# Patient Record
Sex: Female | Born: 1935 | Race: White | Hispanic: No | Marital: Married | Smoking: Never smoker
Health system: Southern US, Community
[De-identification: ages and names within clinical notes are randomized; demographics above are authoritative.]

## PROBLEM LIST (undated history)

## (undated) DIAGNOSIS — N183 Chronic kidney disease, stage 3 unspecified: Secondary | ICD-10-CM

## (undated) DIAGNOSIS — G4700 Insomnia, unspecified: Secondary | ICD-10-CM

## (undated) DIAGNOSIS — E039 Hypothyroidism, unspecified: Secondary | ICD-10-CM

## (undated) DIAGNOSIS — M48062 Spinal stenosis, lumbar region with neurogenic claudication: Secondary | ICD-10-CM

## (undated) DIAGNOSIS — D649 Anemia, unspecified: Secondary | ICD-10-CM

## (undated) DIAGNOSIS — M47816 Spondylosis without myelopathy or radiculopathy, lumbar region: Secondary | ICD-10-CM

## (undated) DIAGNOSIS — I10 Essential (primary) hypertension: Secondary | ICD-10-CM

## (undated) DIAGNOSIS — M5416 Radiculopathy, lumbar region: Secondary | ICD-10-CM

## (undated) HISTORY — DX: Chronic kidney disease, stage 3 unspecified: N18.30

## (undated) HISTORY — PX: ABDOMINAL HYSTERECTOMY: SHX81

## (undated) HISTORY — DX: Spinal stenosis, lumbar region with neurogenic claudication: M48.062

## (undated) HISTORY — PX: APPENDECTOMY: SHX54

## (undated) HISTORY — DX: Anemia, unspecified: D64.9

## (undated) HISTORY — DX: Radiculopathy, lumbar region: M54.16

## (undated) HISTORY — DX: Insomnia, unspecified: G47.00

## (undated) HISTORY — DX: Chronic kidney disease, stage 3 (moderate): N18.3

## (undated) HISTORY — DX: Hypothyroidism, unspecified: E03.9

## (undated) HISTORY — DX: Essential (primary) hypertension: I10

## (undated) HISTORY — DX: Spondylosis without myelopathy or radiculopathy, lumbar region: M47.816

---

## 2000-05-10 HISTORY — PX: TOTAL KNEE ARTHROPLASTY: SHX125

## 2011-05-31 ENCOUNTER — Other Ambulatory Visit: Payer: Self-pay | Admitting: Sports Medicine

## 2011-05-31 LAB — BASIC METABOLIC PANEL
Anion Gap: 11 (ref 7–16)
BUN: 33 mg/dL — ABNORMAL HIGH (ref 7–18)
Calcium, Total: 9.4 mg/dL (ref 8.5–10.1)
Chloride: 101 mmol/L (ref 98–107)
Co2: 27 mmol/L (ref 21–32)
Creatinine: 2.06 mg/dL — ABNORMAL HIGH (ref 0.60–1.30)
EGFR (African American): 30 — ABNORMAL LOW
EGFR (Non-African Amer.): 25 — ABNORMAL LOW
Glucose: 94 mg/dL (ref 65–99)
Osmolality: 285 (ref 275–301)
Potassium: 5.2 mmol/L — ABNORMAL HIGH (ref 3.5–5.1)
Sodium: 139 mmol/L (ref 136–145)

## 2011-06-14 ENCOUNTER — Ambulatory Visit: Payer: Self-pay | Admitting: Sports Medicine

## 2013-12-18 ENCOUNTER — Ambulatory Visit: Payer: Self-pay | Admitting: Internal Medicine

## 2013-12-18 LAB — CBC CANCER CENTER
BASOS ABS: 0.1 x10 3/mm (ref 0.0–0.1)
BASOS PCT: 0.7 %
EOS PCT: 1.7 %
Eosinophil #: 0.1 x10 3/mm (ref 0.0–0.7)
HCT: 29.8 % — ABNORMAL LOW (ref 35.0–47.0)
HGB: 9.5 g/dL — AB (ref 12.0–16.0)
Lymphocyte #: 0.7 x10 3/mm — ABNORMAL LOW (ref 1.0–3.6)
Lymphocyte %: 9.4 %
MCH: 26.7 pg (ref 26.0–34.0)
MCHC: 31.8 g/dL — ABNORMAL LOW (ref 32.0–36.0)
MCV: 84 fL (ref 80–100)
Monocyte #: 0.6 x10 3/mm (ref 0.2–0.9)
Monocyte %: 7.7 %
Neutrophil #: 6.3 x10 3/mm (ref 1.4–6.5)
Neutrophil %: 80.5 %
PLATELETS: 589 x10 3/mm — AB (ref 150–440)
RBC: 3.54 10*6/uL — ABNORMAL LOW (ref 3.80–5.20)
RDW: 16.7 % — ABNORMAL HIGH (ref 11.5–14.5)
WBC: 7.9 x10 3/mm (ref 3.6–11.0)

## 2013-12-18 LAB — IRON AND TIBC
IRON SATURATION: 9 %
Iron Bind.Cap.(Total): 247 ug/dL — ABNORMAL LOW (ref 250–450)
Iron: 21 ug/dL — ABNORMAL LOW (ref 50–170)
UNBOUND IRON-BIND. CAP.: 226 ug/dL

## 2013-12-18 LAB — FERRITIN: Ferritin (ARMC): 105 ng/mL (ref 8–388)

## 2013-12-19 LAB — PROT IMMUNOELECTROPHORES(ARMC)

## 2013-12-19 LAB — KAPPA/LAMBDA FREE LIGHT CHAINS (ARMC)

## 2014-01-07 LAB — CBC CANCER CENTER
Basophil #: 0.1 x10 3/mm (ref 0.0–0.1)
Basophil %: 0.7 %
EOS PCT: 2 %
Eosinophil #: 0.1 x10 3/mm (ref 0.0–0.7)
HCT: 28.4 % — AB (ref 35.0–47.0)
HGB: 9 g/dL — ABNORMAL LOW (ref 12.0–16.0)
LYMPHS ABS: 0.5 x10 3/mm — AB (ref 1.0–3.6)
Lymphocyte %: 7.1 %
MCH: 26.6 pg (ref 26.0–34.0)
MCHC: 31.7 g/dL — ABNORMAL LOW (ref 32.0–36.0)
MCV: 84 fL (ref 80–100)
MONO ABS: 0.5 x10 3/mm (ref 0.2–0.9)
Monocyte %: 6.9 %
NEUTROS ABS: 6.1 x10 3/mm (ref 1.4–6.5)
NEUTROS PCT: 83.3 %
Platelet: 537 x10 3/mm — ABNORMAL HIGH (ref 150–440)
RBC: 3.39 10*6/uL — ABNORMAL LOW (ref 3.80–5.20)
RDW: 16.7 % — ABNORMAL HIGH (ref 11.5–14.5)
WBC: 7.4 x10 3/mm (ref 3.6–11.0)

## 2014-01-08 ENCOUNTER — Ambulatory Visit: Payer: Self-pay | Admitting: Internal Medicine

## 2014-01-22 LAB — FERRITIN: FERRITIN (ARMC): 107 ng/mL (ref 8–388)

## 2014-01-22 LAB — CANCER CENTER HEMOGLOBIN: HGB: 8.7 g/dL — AB (ref 12.0–16.0)

## 2014-02-07 ENCOUNTER — Ambulatory Visit: Payer: Self-pay | Admitting: Internal Medicine

## 2014-02-08 LAB — CANCER CENTER HEMOGLOBIN: HGB: 9.4 g/dL — AB (ref 12.0–16.0)

## 2014-03-08 LAB — CANCER CENTER HEMOGLOBIN: HGB: 9.5 g/dL — ABNORMAL LOW (ref 12.0–16.0)

## 2014-03-10 ENCOUNTER — Ambulatory Visit: Payer: Self-pay | Admitting: Internal Medicine

## 2014-04-08 LAB — FERRITIN: Ferritin (ARMC): 129 ng/mL (ref 8–388)

## 2014-04-08 LAB — CANCER CENTER HEMOGLOBIN: HGB: 9.5 g/dL — ABNORMAL LOW (ref 12.0–16.0)

## 2014-04-08 LAB — CANCER CTR PLATELET CT: PLATELETS: 570 x10 3/mm — AB (ref 150–440)

## 2014-04-09 ENCOUNTER — Ambulatory Visit: Payer: Self-pay | Admitting: Internal Medicine

## 2014-05-06 LAB — IRON AND TIBC
IRON BIND. CAP.(TOTAL): 227 ug/dL — AB (ref 250–450)
IRON SATURATION: 15 %
IRON: 35 ug/dL — AB (ref 50–170)
UNBOUND IRON-BIND. CAP.: 192 ug/dL

## 2014-05-06 LAB — CANCER CENTER HEMOGLOBIN: HGB: 9.4 g/dL — AB (ref 12.0–16.0)

## 2014-05-10 ENCOUNTER — Ambulatory Visit: Payer: Self-pay | Admitting: Internal Medicine

## 2014-06-03 LAB — CBC CANCER CENTER
BASOS PCT: 0.8 %
Basophil #: 0.1 x10 3/mm (ref 0.0–0.1)
EOS ABS: 0.4 x10 3/mm (ref 0.0–0.7)
Eosinophil %: 4.4 %
HCT: 28.2 % — ABNORMAL LOW (ref 35.0–47.0)
HGB: 8.9 g/dL — ABNORMAL LOW (ref 12.0–16.0)
LYMPHS PCT: 11.1 %
Lymphocyte #: 0.9 x10 3/mm — ABNORMAL LOW (ref 1.0–3.6)
MCH: 26.6 pg (ref 26.0–34.0)
MCHC: 31.6 g/dL — ABNORMAL LOW (ref 32.0–36.0)
MCV: 84 fL (ref 80–100)
MONOS PCT: 9.4 %
Monocyte #: 0.7 x10 3/mm (ref 0.2–0.9)
Neutrophil #: 5.9 x10 3/mm (ref 1.4–6.5)
Neutrophil %: 74.3 %
PLATELETS: 548 x10 3/mm — AB (ref 150–440)
RBC: 3.36 10*6/uL — AB (ref 3.80–5.20)
RDW: 15.5 % — ABNORMAL HIGH (ref 11.5–14.5)
WBC: 7.9 x10 3/mm (ref 3.6–11.0)

## 2014-06-03 LAB — IRON AND TIBC
IRON: 28 ug/dL — AB (ref 50–170)
Iron Bind.Cap.(Total): 211 ug/dL — ABNORMAL LOW (ref 250–450)
Iron Saturation: 13 %
Unbound Iron-Bind.Cap.: 183 ug/dL

## 2014-06-03 LAB — FERRITIN: Ferritin (ARMC): 121 ng/mL (ref 8–388)

## 2014-06-10 ENCOUNTER — Ambulatory Visit: Payer: Self-pay | Admitting: Internal Medicine

## 2014-07-09 ENCOUNTER — Ambulatory Visit
Admit: 2014-07-09 | Disposition: A | Discharge status: Home / Self Care | Payer: Self-pay | Attending: Internal Medicine | Admitting: Internal Medicine

## 2014-08-26 ENCOUNTER — Ambulatory Visit
Admit: 2014-08-26 | Disposition: A | Discharge status: Home / Self Care | Payer: Self-pay | Attending: Internal Medicine | Admitting: Internal Medicine

## 2014-08-26 LAB — CANCER CENTER HEMOGLOBIN: HGB: 10.2 g/dL — AB (ref 12.0–16.0)

## 2014-10-14 ENCOUNTER — Ambulatory Visit: Payer: Self-pay

## 2014-10-14 ENCOUNTER — Ambulatory Visit: Payer: Self-pay | Admitting: Internal Medicine

## 2014-10-14 ENCOUNTER — Other Ambulatory Visit: Payer: Self-pay

## 2014-10-16 ENCOUNTER — Ambulatory Visit: Payer: Self-pay | Admitting: Family Medicine

## 2014-10-16 ENCOUNTER — Inpatient Hospital Stay: Payer: Medicare Other

## 2014-10-16 ENCOUNTER — Ambulatory Visit: Payer: Self-pay

## 2014-11-20 ENCOUNTER — Inpatient Hospital Stay: Payer: Medicare Other | Attending: Family Medicine | Admitting: Family Medicine

## 2014-11-20 ENCOUNTER — Inpatient Hospital Stay: Payer: Medicare Other

## 2014-11-20 ENCOUNTER — Inpatient Hospital Stay (HOSPITAL_BASED_OUTPATIENT_CLINIC_OR_DEPARTMENT_OTHER): Payer: Medicare Other

## 2014-11-20 DIAGNOSIS — N183 Chronic kidney disease, stage 3 (moderate): Secondary | ICD-10-CM | POA: Diagnosis not present

## 2014-11-20 DIAGNOSIS — D509 Iron deficiency anemia, unspecified: Secondary | ICD-10-CM

## 2014-11-20 DIAGNOSIS — Z79899 Other long term (current) drug therapy: Secondary | ICD-10-CM | POA: Diagnosis not present

## 2014-11-20 DIAGNOSIS — D631 Anemia in chronic kidney disease: Secondary | ICD-10-CM | POA: Diagnosis present

## 2014-11-20 LAB — FERRITIN: FERRITIN: 127 ng/mL (ref 11–307)

## 2014-11-20 LAB — HEMOGLOBIN: Hemoglobin: 10.3 g/dL — ABNORMAL LOW (ref 12.0–16.0)

## 2014-11-20 NOTE — Progress Notes (Signed)
[place]  [phone]  [phone]     Lynn Rosario DOB: 05/08/1929  MR#: [id number]  KVQ#:[id number]  Patient Care Team: Sherrin Daisy, MD as PCP - General (Family Medicine)  CHIEF COMPLAINT:  Chief Complaint  Patient presents with  . Anemia  . Follow-up   Re: Multifactorial anemia, iron deficiency as well as anemia and chronic kidney disease  INTERVAL HISTORY:  Patient is here for further follow-up and treatment consideration regarding anemia. Patient reports feeling fairly well. She does have some chronic back pain related to arthritis, as well as knee pain. She is having some fatigue and weakness and does require an ambulatory aid. She denies any cough, shortness of breath, chest pain, fevers, chills. She was recently seen by Dr. Holley Raring at [facility]. Patient has a stage III chronic kidney disease. I have previously discussed use of Procrit injections as well as iron infusions.  REVIEW OF SYSTEMS:   Review of Systems  Constitutional: Positive for malaise/fatigue. Negative for fever, chills, weight loss and diaphoresis.  HENT: Negative for congestion, ear discharge, ear pain, hearing loss, nosebleeds, sore throat and tinnitus.   Eyes: Negative for blurred vision, double vision, photophobia, pain, discharge and redness.  Respiratory: Negative for cough, hemoptysis, sputum production, shortness of breath, wheezing and stridor.   Cardiovascular: Negative for chest pain, palpitations, orthopnea, claudication, leg swelling and PND.  Gastrointestinal: Negative for heartburn, nausea, vomiting, abdominal pain, diarrhea, constipation, blood in stool and melena.  Genitourinary: Negative.   Musculoskeletal: Positive for back pain.  Skin: Negative.   Neurological: Positive for weakness. Negative for dizziness, tingling, focal weakness, seizures and headaches.  Endo/Heme/Allergies: Does not bruise/bleed easily.    Psychiatric/Behavioral: Negative for depression. The patient is not nervous/anxious and does not have insomnia.     As per HPI. Otherwise, a complete review of systems is negatve.  PAST MEDICAL HISTORY: No past medical history on file.  PAST SURGICAL HISTORY: No past surgical history on file.  FAMILY HISTORY No family history on file.  GYNECOLOGIC HISTORY:  No LMP recorded.     ADVANCED DIRECTIVES:    HEALTH MAINTENANCE: History  Substance Use Topics  . Smoking status: Not on file  . Smokeless tobacco: Not on file  . Alcohol Use: Not on file     Colonoscopy:  PAP:  Bone density:  Lipid panel:  Allergies  Allergen Reactions  . Celecoxib Other (See Comments)    Decreased kidney function   . Clarithromycin Other (See Comments)    Loss of taste     Current Outpatient Prescriptions  Medication Sig Dispense Refill  . ALPRAZolam (XANAX) 0.25 MG tablet TAKE (1) TABLET BY MOUTH TWICE DAILY AS NEEDED FOR SLEEP/ANXIETY    . HYDROcodone-acetaminophen (NORCO/VICODIN) 5-325 MG per tablet 1/2-1 po tid prn Earliest Fill Date: 11/07/14    . levothyroxine (SYNTHROID, LEVOTHROID) 75 MCG tablet Take by mouth.    Marland Kitchen aspirin EC 81 MG tablet Take by mouth.    . Cetirizine HCl 10 MG CAPS Take by mouth.    . Cyanocobalamin 2500 MCG SUBL Place under the tongue.    . diphenhydramine-acetaminophen (TYLENOL PM) 25-500 MG TABS Take by mouth.    . ferrous sulfate 325 (65 FE) MG EC tablet Take by mouth.    Marland Kitchen lisinopril (PRINIVIL,ZESTRIL) 5 MG tablet Take by mouth.    . Multiple Vitamin (MULTI-VITAMINS) TABS Take by mouth.    . sodium bicarbonate 650 MG tablet Take by mouth.  No current facility-administered medications for this visit.    OBJECTIVE: There were no vitals taken for this visit.   There is no height or weight on file to calculate BMI.    ECOG FS:1 - Symptomatic but completely ambulatory  General: Well-developed, well-nourished, no acute distress. Eyes: Pink conjunctiva,  anicteric sclera. HEENT: Normocephalic, moist mucous membranes, clear oropharnyx. Lungs: Clear to auscultation bilaterally. Heart: Regular rate and rhythm. No rubs, murmurs, or gallops. Abdomen: Soft, nontender, nondistended. No organomegaly noted, normoactive bowel sounds. Musculoskeletal: No edema, cyanosis, or clubbing. Neuro: Alert, answering all questions appropriately. Cranial nerves grossly intact. Skin: No rashes or petechiae noted. Psych: Normal affect.    LAB RESULTS:  Appointment on 11/20/2014  Component Date Value Ref Range Status  . Hemoglobin 11/20/2014 10.3* 12.0 - 16.0 g/dL Final  . Ferritin 11/20/2014 127  11 - 307 ng/mL Final    STUDIES: No results found.  ASSESSMENT:  Anemia, multifactorial related to chronic kidney disease and iron deficiency  PLAN:   1. Anemia. Patient's ferritin level today is 127, hemoglobin 10.3. She was recently evaluated by nephrology due to a stage III chronic kidney disease. At time of consultation with Dr. Holley Raring in June 2016 hemoglobin was 9.7, ferritin 168, iron saturation 11%, and serum iron 23. As previously discussed the use of IV iron supplementation as well as Procrit injections with patient. At this time she does not wish to pursue either. She states she would like to wait until she is more symptomatic. Daughter is with patient and reports that if condition declines this and they will consider Procrit and IV iron. We will schedule patient for labs and [facility] in approximately 6 weeks. We'll have patient return in approximately 12 weeks for continued follow-up.  Patient expressed understanding and was in agreement with this plan. She also understands that She can call clinic at any time with any questions, concerns, or complaints.   Dr. Oliva Bustard was available for consultation and review of plan of care for this patient.    Evlyn Kanner, NP   11/20/2014 4:18 PM

## 2014-12-27 ENCOUNTER — Inpatient Hospital Stay: Payer: Medicare Other | Attending: Family Medicine

## 2014-12-27 DIAGNOSIS — N183 Chronic kidney disease, stage 3 (moderate): Secondary | ICD-10-CM | POA: Insufficient documentation

## 2014-12-27 DIAGNOSIS — D631 Anemia in chronic kidney disease: Secondary | ICD-10-CM | POA: Diagnosis not present

## 2014-12-27 DIAGNOSIS — Z79899 Other long term (current) drug therapy: Secondary | ICD-10-CM | POA: Diagnosis not present

## 2014-12-27 DIAGNOSIS — D509 Iron deficiency anemia, unspecified: Secondary | ICD-10-CM | POA: Insufficient documentation

## 2014-12-27 LAB — CBC WITH DIFFERENTIAL/PLATELET
BASOS PCT: 1 %
Basophils Absolute: 0.1 10*3/uL (ref 0–0.1)
Eosinophils Absolute: 0.2 10*3/uL (ref 0–0.7)
Eosinophils Relative: 3 %
HEMATOCRIT: 30.5 % — AB (ref 35.0–47.0)
HEMOGLOBIN: 9.9 g/dL — AB (ref 12.0–16.0)
Lymphocytes Relative: 23 %
Lymphs Abs: 1.7 10*3/uL (ref 1.0–3.6)
MCH: 28.5 pg (ref 26.0–34.0)
MCHC: 32.6 g/dL (ref 32.0–36.0)
MCV: 87.2 fL (ref 80.0–100.0)
MONOS PCT: 8 %
Monocytes Absolute: 0.6 10*3/uL (ref 0.2–0.9)
NEUTROS ABS: 4.6 10*3/uL (ref 1.4–6.5)
NEUTROS PCT: 65 %
Platelets: 567 10*3/uL — ABNORMAL HIGH (ref 150–440)
RBC: 3.5 MIL/uL — AB (ref 3.80–5.20)
RDW: 14.7 % — ABNORMAL HIGH (ref 11.5–14.5)
WBC: 7.1 10*3/uL (ref 3.6–11.0)

## 2014-12-27 LAB — FERRITIN: Ferritin: 143 ng/mL (ref 11–307)

## 2014-12-27 LAB — IRON AND TIBC
Iron: 41 ug/dL (ref 28–170)
SATURATION RATIOS: 20 % (ref 10.4–31.8)
TIBC: 204 ug/dL — AB (ref 250–450)
UIBC: 163 ug/dL

## 2015-02-07 ENCOUNTER — Inpatient Hospital Stay: Payer: Medicare Other

## 2015-02-07 ENCOUNTER — Inpatient Hospital Stay: Payer: Medicare Other | Admitting: Family Medicine

## 2015-02-14 ENCOUNTER — Other Ambulatory Visit: Payer: Medicare Other

## 2015-02-14 ENCOUNTER — Ambulatory Visit: Payer: Medicare Other | Admitting: Family Medicine

## 2015-02-24 ENCOUNTER — Encounter: Payer: Self-pay | Admitting: *Deleted

## 2015-02-24 ENCOUNTER — Inpatient Hospital Stay (HOSPITAL_BASED_OUTPATIENT_CLINIC_OR_DEPARTMENT_OTHER): Payer: Medicare Other | Admitting: Internal Medicine

## 2015-02-24 ENCOUNTER — Ambulatory Visit: Payer: Medicare Other | Admitting: Family Medicine

## 2015-02-24 ENCOUNTER — Inpatient Hospital Stay: Payer: Medicare Other | Attending: Family Medicine

## 2015-02-24 ENCOUNTER — Other Ambulatory Visit: Payer: Self-pay | Admitting: *Deleted

## 2015-02-24 ENCOUNTER — Other Ambulatory Visit: Payer: Medicare Other

## 2015-02-24 VITALS — BP 104/52 | HR 76 | Temp 97.2°F | Resp 18 | Ht 60.0 in | Wt 105.6 lb

## 2015-02-24 DIAGNOSIS — N189 Chronic kidney disease, unspecified: Secondary | ICD-10-CM

## 2015-02-24 DIAGNOSIS — I129 Hypertensive chronic kidney disease with stage 1 through stage 4 chronic kidney disease, or unspecified chronic kidney disease: Secondary | ICD-10-CM | POA: Diagnosis not present

## 2015-02-24 DIAGNOSIS — D631 Anemia in chronic kidney disease: Secondary | ICD-10-CM

## 2015-02-24 DIAGNOSIS — N183 Chronic kidney disease, stage 3 (moderate): Secondary | ICD-10-CM | POA: Diagnosis not present

## 2015-02-24 DIAGNOSIS — D509 Iron deficiency anemia, unspecified: Secondary | ICD-10-CM

## 2015-02-24 DIAGNOSIS — M47816 Spondylosis without myelopathy or radiculopathy, lumbar region: Secondary | ICD-10-CM | POA: Insufficient documentation

## 2015-02-24 DIAGNOSIS — Z79899 Other long term (current) drug therapy: Secondary | ICD-10-CM | POA: Insufficient documentation

## 2015-02-24 DIAGNOSIS — E039 Hypothyroidism, unspecified: Secondary | ICD-10-CM | POA: Insufficient documentation

## 2015-02-24 LAB — CBC WITH DIFFERENTIAL/PLATELET
BASOS PCT: 1 %
Basophils Absolute: 0 10*3/uL (ref 0–0.1)
EOS ABS: 0.1 10*3/uL (ref 0–0.7)
Eosinophils Relative: 2 %
HEMATOCRIT: 28.4 % — AB (ref 35.0–47.0)
HEMOGLOBIN: 8.9 g/dL — AB (ref 12.0–16.0)
Lymphocytes Relative: 11 %
Lymphs Abs: 0.6 10*3/uL — ABNORMAL LOW (ref 1.0–3.6)
MCH: 27.4 pg (ref 26.0–34.0)
MCHC: 31.6 g/dL — AB (ref 32.0–36.0)
MCV: 87 fL (ref 80.0–100.0)
MONOS PCT: 11 %
Monocytes Absolute: 0.6 10*3/uL (ref 0.2–0.9)
NEUTROS ABS: 4.4 10*3/uL (ref 1.4–6.5)
NEUTROS PCT: 75 %
Platelets: 517 10*3/uL — ABNORMAL HIGH (ref 150–440)
RBC: 3.26 MIL/uL — AB (ref 3.80–5.20)
RDW: 15.1 % — ABNORMAL HIGH (ref 11.5–14.5)
WBC: 5.9 10*3/uL (ref 3.6–11.0)

## 2015-02-24 LAB — IRON AND TIBC
IRON: 15 ug/dL — AB (ref 28–170)
SATURATION RATIOS: 8 % — AB (ref 10.4–31.8)
TIBC: 192 ug/dL — AB (ref 250–450)
UIBC: 177 ug/dL

## 2015-02-24 LAB — BASIC METABOLIC PANEL
ANION GAP: 8 (ref 5–15)
BUN: 26 mg/dL — ABNORMAL HIGH (ref 6–20)
CHLORIDE: 97 mmol/L — AB (ref 101–111)
CO2: 27 mmol/L (ref 22–32)
CREATININE: 1.46 mg/dL — AB (ref 0.44–1.00)
Calcium: 8.8 mg/dL — ABNORMAL LOW (ref 8.9–10.3)
GFR calc non Af Amer: 32 mL/min — ABNORMAL LOW (ref >60)
GFR, EST AFRICAN AMERICAN: 37 mL/min — AB (ref >60)
Glucose, Bld: 127 mg/dL — ABNORMAL HIGH (ref 65–99)
POTASSIUM: 4.4 mmol/L (ref 3.5–5.1)
SODIUM: 132 mmol/L — AB (ref 135–145)

## 2015-02-24 LAB — FERRITIN: Ferritin: 146 ng/mL (ref 11–307)

## 2015-02-24 NOTE — Progress Notes (Signed)
Back pain from arthritis-gets injections by Dr. Sharlet Salina. Fatigue all the time- rests several times a day.  Does not see blood in stools because of being on iron pills

## 2015-02-24 NOTE — Progress Notes (Signed)
[place] OFFICE PROGRESS NOTE  Patient Care Team: Sherrin Daisy, MD as PCP - General (Family Medicine)   SUMMARY OF ONCOLOGIC HISTORY:  # ANEMIA- multi fact - Iron def/CKD; OCT 2016- START PROCRIT  # CKD   INTERVAL HISTORY:  81 year old female patient with a history of chronic back pain and long-standing history of anemia attribute it to chronic kidney disease/iron deficiency is here for follow-up. Patient has not been on any treatments up from by mouth iron.  Patient complains of significant fatigue; tiredness; feels like sleeping all the time. She denies any significant weight loss. Denies any abdominal pain or difficult swallowing. Denies any blood in stools. She is not sure if she had a colonoscopy.  REVIEW OF SYSTEMS:  A complete 10 point review of system is done which is negative except mentioned above/history of present illness.   PAST MEDICAL HISTORY :  Past Medical History  Diagnosis Date  . Stage III chronic kidney disease   . Insomnia   . Lumbar radiculitis   . Lumbar stenosis with neurogenic claudication   . Lumbar spondylosis   . Anemia   . Hypertension   . Hypothyroidism     PAST SURGICAL HISTORY :  History reviewed. No pertinent past surgical history.  FAMILY HISTORY :   Family History  Problem Relation Age of Onset  . Stroke Mother   . CAD Father   . Stomach cancer Sister   . Breast cancer Sister   . Breast cancer Sister   . Breast cancer Daughter     SOCIAL HISTORY:   Social History  Substance Use Topics  . Smoking status: Never Smoker   . Smokeless tobacco: Never Used  . Alcohol Use: No    ALLERGIES:  is allergic to celecoxib and clarithromycin.  MEDICATIONS:  Current Outpatient Prescriptions  Medication Sig Dispense Refill  . acetaminophen (TYLENOL) 325 MG tablet Take 650 mg by mouth daily after lunch.    . ALPRAZolam (XANAX) 0.25 MG tablet TAKE (1) TABLET BY MOUTH TWICE DAILY AS NEEDED FOR SLEEP/ANXIETY    . aspirin  EC 81 MG tablet Take by mouth.    . Cetirizine HCl 10 MG CAPS Take by mouth.    . Cyanocobalamin 2500 MCG SUBL Place under the tongue.    . ferrous sulfate 325 (65 FE) MG EC tablet Take by mouth 2 (two) times daily.     Marland Kitchen HYDROcodone-acetaminophen (NORCO/VICODIN) 5-325 MG per tablet 1/2-1 po tid prn Earliest Fill Date: 11/07/14    . levothyroxine (SYNTHROID, LEVOTHROID) 75 MCG tablet Take by mouth.    Marland Kitchen lisinopril (PRINIVIL,ZESTRIL) 5 MG tablet Take by mouth.    . Multiple Vitamin (MULTI-VITAMINS) TABS Take by mouth.    . sodium bicarbonate 650 MG tablet Take 650 mg by mouth 2 (two) times daily.      No current facility-administered medications for this visit.    PHYSICAL EXAMINATION: ECOG PERFORMANCE STATUS: 1 - Symptomatic but completely ambulatory  BP 104/52 mmHg  Pulse 76  Temp(Src) 97.2 F (36.2 C) (Tympanic)  Resp 18  Ht 5' (1.524 m)  Wt 105 lb 9.6 oz (47.9 kg)  BMI 20.62 kg/m2  Filed Weights   02/24/15 1359  Weight: 105 lb 9.6 oz (47.9 kg)    GENERAL: Thin built cachectic. Alert, no distress and comfortable.   Accompanied by daughter. She has difficulty sitting on the exam table because of back brace/back pain. EYES: Positive for pallor. OROPHARYNX: no thrush or ulceration; good dentition  NECK:  supple, no masses felt LYMPH:  no palpable lymphadenopathy in the cervical, axillary or inguinal regions LUNGS: clear to auscultation and  No wheeze or crackles HEART/CVS: regular rate & rhythm and no murmurs; No lower extremity edema ABDOMEN:abdomen soft, non-tender and normal bowel sounds Musculoskeletal:no cyanosis of digits and no clubbing  PSYCH: alert & oriented x 3 with fluent speech NEURO: no focal motor/sensory deficits SKIN:  no rashes or significant lesions  LABORATORY DATA:  I have reviewed the data as listed    Component Value Date/Time   NA 132* 02/24/2015 1323   NA 139 05/31/2011 1500   K 4.4 02/24/2015 1323   K 5.2* 05/31/2011 1500   CL 97* 02/24/2015  1323   CL 101 05/31/2011 1500   CO2 27 02/24/2015 1323   CO2 27 05/31/2011 1500   GLUCOSE 127* 02/24/2015 1323   GLUCOSE 94 05/31/2011 1500   BUN 26* 02/24/2015 1323   BUN 33* 05/31/2011 1500   CREATININE 1.46* 02/24/2015 1323   CREATININE 2.06* 05/31/2011 1500   CALCIUM 8.8* 02/24/2015 1323   CALCIUM 9.4 05/31/2011 1500   GFRNONAA 32* 02/24/2015 1323   GFRNONAA 25* 05/31/2011 1500   GFRAA 37* 02/24/2015 1323   GFRAA 30* 05/31/2011 1500    No results found for: SPEP, UPEP  Lab Results  Component Value Date   WBC 5.9 02/24/2015   NEUTROABS 4.4 02/24/2015   HGB 8.9* 02/24/2015   HCT 28.4* 02/24/2015   MCV 87.0 02/24/2015   PLT 517* 02/24/2015      Chemistry      Component Value Date/Time   NA 132* 02/24/2015 1323   NA 139 05/31/2011 1500   K 4.4 02/24/2015 1323   K 5.2* 05/31/2011 1500   CL 97* 02/24/2015 1323   CL 101 05/31/2011 1500   CO2 27 02/24/2015 1323   CO2 27 05/31/2011 1500   BUN 26* 02/24/2015 1323   BUN 33* 05/31/2011 1500   CREATININE 1.46* 02/24/2015 1323   CREATININE 2.06* 05/31/2011 1500      Component Value Date/Time   CALCIUM 8.8* 02/24/2015 1323   CALCIUM 9.4 05/31/2011 1500       RADIOGRAPHIC STUDIES: I have personally reviewed the radiological images as listed and agreed with the findings in the report. No results found.   ASSESSMENT & PLAN:   # Chronic anemia-unclear etiology. Question CKD with iron deficiency [ferritin not a good indicator of deficiency because of CKD; saturations low]. Today hemoglobin is 8.9. She is very symptomatic. Recommend Procrit/IV iron.  Discussed with the patient that Procrit would be recommended on a weekly basis- To help improve the hemoglobin. Discussed that the mechanism of action; and the potential side effects of hypertension; and if hemoglobin above 12; the potential for strokes; also the rare risk of progression of any underlying malignancy.  # Also recommend IV iron weekly 2; and then as  needed.  All questions were answered. The patient knows to call the clinic with any problems, questions or concerns. No barriers to learning was detected. I spent 25 minutes counseling the patient face to face. The total time spent in the appointment was 30 minutes and more than 50% was on counseling and review of test results     Cammie Sickle, MD 02/24/2015 2:30 PM

## 2015-02-25 ENCOUNTER — Other Ambulatory Visit: Payer: Medicare Other

## 2015-02-25 ENCOUNTER — Ambulatory Visit: Payer: Medicare Other | Admitting: Internal Medicine

## 2015-03-04 ENCOUNTER — Inpatient Hospital Stay: Payer: Medicare Other

## 2015-03-04 VITALS — BP 140/66 | HR 78 | Temp 96.9°F | Resp 18

## 2015-03-04 DIAGNOSIS — D631 Anemia in chronic kidney disease: Secondary | ICD-10-CM

## 2015-03-04 DIAGNOSIS — N189 Chronic kidney disease, unspecified: Principal | ICD-10-CM

## 2015-03-04 DIAGNOSIS — N183 Chronic kidney disease, stage 3 (moderate): Secondary | ICD-10-CM | POA: Diagnosis not present

## 2015-03-04 LAB — HEMATOCRIT: HCT: 27 % — ABNORMAL LOW (ref 35.0–47.0)

## 2015-03-04 LAB — HEMOGLOBIN: HEMOGLOBIN: 8.8 g/dL — AB (ref 12.0–16.0)

## 2015-03-04 LAB — LACTATE DEHYDROGENASE: LDH: 79 U/L — ABNORMAL LOW (ref 98–192)

## 2015-03-04 MED ORDER — EPOETIN ALFA 40000 UNIT/ML IJ SOLN
40000.0000 [IU] | Freq: Once | INTRAMUSCULAR | Status: AC
  Administered 2015-03-04: 40000 [IU] via SUBCUTANEOUS
  Filled 2015-03-04: qty 1

## 2015-03-05 ENCOUNTER — Inpatient Hospital Stay: Payer: Medicare Other

## 2015-03-05 VITALS — BP 146/51 | HR 61 | Temp 96.6°F | Resp 16

## 2015-03-05 DIAGNOSIS — N183 Chronic kidney disease, stage 3 (moderate): Secondary | ICD-10-CM | POA: Diagnosis not present

## 2015-03-05 DIAGNOSIS — D631 Anemia in chronic kidney disease: Secondary | ICD-10-CM

## 2015-03-05 DIAGNOSIS — N189 Chronic kidney disease, unspecified: Principal | ICD-10-CM

## 2015-03-05 LAB — SOLUBLE TRANSFERRIN RECEPTOR: TRANSFERRIN RECEPTOR: 20.4 nmol/L (ref 12.2–27.3)

## 2015-03-05 MED ORDER — SODIUM CHLORIDE 0.9 % IV SOLN
510.0000 mg | Freq: Once | INTRAVENOUS | Status: AC
  Administered 2015-03-05: 510 mg via INTRAVENOUS
  Filled 2015-03-05: qty 17

## 2015-03-10 ENCOUNTER — Inpatient Hospital Stay: Payer: Medicare Other

## 2015-03-10 VITALS — BP 148/61 | HR 76 | Temp 96.6°F | Resp 16

## 2015-03-10 DIAGNOSIS — N189 Chronic kidney disease, unspecified: Principal | ICD-10-CM

## 2015-03-10 DIAGNOSIS — D631 Anemia in chronic kidney disease: Secondary | ICD-10-CM

## 2015-03-10 DIAGNOSIS — N183 Chronic kidney disease, stage 3 (moderate): Secondary | ICD-10-CM | POA: Diagnosis not present

## 2015-03-10 LAB — HEMOGLOBIN: HEMOGLOBIN: 10.3 g/dL — AB (ref 12.0–16.0)

## 2015-03-10 LAB — HEMATOCRIT: HEMATOCRIT: 32.4 % — AB (ref 35.0–47.0)

## 2015-03-10 MED ORDER — EPOETIN ALFA 40000 UNIT/ML IJ SOLN
40000.0000 [IU] | Freq: Once | INTRAMUSCULAR | Status: AC
  Administered 2015-03-10: 40000 [IU] via SUBCUTANEOUS
  Filled 2015-03-10: qty 1

## 2015-03-11 ENCOUNTER — Inpatient Hospital Stay: Payer: Medicare Other | Attending: Internal Medicine

## 2015-03-11 VITALS — BP 130/45 | HR 62 | Temp 97.1°F | Resp 18

## 2015-03-11 DIAGNOSIS — Z79899 Other long term (current) drug therapy: Secondary | ICD-10-CM | POA: Insufficient documentation

## 2015-03-11 DIAGNOSIS — D631 Anemia in chronic kidney disease: Secondary | ICD-10-CM | POA: Diagnosis not present

## 2015-03-11 DIAGNOSIS — N183 Chronic kidney disease, stage 3 (moderate): Secondary | ICD-10-CM | POA: Insufficient documentation

## 2015-03-11 DIAGNOSIS — N189 Chronic kidney disease, unspecified: Secondary | ICD-10-CM

## 2015-03-11 MED ORDER — SODIUM CHLORIDE 0.9 % IV SOLN
Freq: Once | INTRAVENOUS | Status: AC
  Administered 2015-03-11: 10:00:00 via INTRAVENOUS
  Filled 2015-03-11: qty 1000

## 2015-03-11 MED ORDER — SODIUM CHLORIDE 0.9 % IV SOLN
510.0000 mg | Freq: Once | INTRAVENOUS | Status: AC
  Administered 2015-03-11: 510 mg via INTRAVENOUS
  Filled 2015-03-11: qty 17

## 2015-03-18 ENCOUNTER — Inpatient Hospital Stay: Payer: Medicare Other

## 2015-03-18 DIAGNOSIS — D631 Anemia in chronic kidney disease: Secondary | ICD-10-CM

## 2015-03-18 DIAGNOSIS — N183 Chronic kidney disease, stage 3 (moderate): Secondary | ICD-10-CM | POA: Diagnosis not present

## 2015-03-18 DIAGNOSIS — N189 Chronic kidney disease, unspecified: Principal | ICD-10-CM

## 2015-03-18 LAB — HEMOGLOBIN: Hemoglobin: 11.8 g/dL — ABNORMAL LOW (ref 12.0–16.0)

## 2015-03-18 LAB — HEMATOCRIT: HCT: 38 % (ref 35.0–47.0)

## 2015-03-25 ENCOUNTER — Inpatient Hospital Stay: Payer: Medicare Other

## 2015-03-25 DIAGNOSIS — D631 Anemia in chronic kidney disease: Secondary | ICD-10-CM

## 2015-03-25 DIAGNOSIS — N189 Chronic kidney disease, unspecified: Principal | ICD-10-CM

## 2015-03-25 DIAGNOSIS — N183 Chronic kidney disease, stage 3 (moderate): Secondary | ICD-10-CM | POA: Diagnosis not present

## 2015-03-25 LAB — HEMOGLOBIN: HEMOGLOBIN: 12.2 g/dL (ref 12.0–16.0)

## 2015-03-25 LAB — HEMATOCRIT: HCT: 38.6 % (ref 35.0–47.0)

## 2015-03-31 ENCOUNTER — Other Ambulatory Visit: Payer: Self-pay | Admitting: Family Medicine

## 2015-03-31 ENCOUNTER — Inpatient Hospital Stay: Payer: Medicare Other

## 2015-03-31 DIAGNOSIS — N183 Chronic kidney disease, stage 3 (moderate): Secondary | ICD-10-CM | POA: Diagnosis not present

## 2015-03-31 DIAGNOSIS — D631 Anemia in chronic kidney disease: Secondary | ICD-10-CM

## 2015-03-31 DIAGNOSIS — N189 Chronic kidney disease, unspecified: Principal | ICD-10-CM

## 2015-03-31 LAB — CBC WITH DIFFERENTIAL/PLATELET
BASOS ABS: 0.1 10*3/uL (ref 0–0.1)
Basophils Relative: 1 %
EOS PCT: 2 %
Eosinophils Absolute: 0.1 10*3/uL (ref 0–0.7)
HCT: 38.7 % (ref 35.0–47.0)
Hemoglobin: 12.4 g/dL (ref 12.0–16.0)
LYMPHS PCT: 16 %
Lymphs Abs: 1.1 10*3/uL (ref 1.0–3.6)
MCH: 29.8 pg (ref 26.0–34.0)
MCHC: 31.9 g/dL — ABNORMAL LOW (ref 32.0–36.0)
MCV: 93.2 fL (ref 80.0–100.0)
Monocytes Absolute: 0.4 10*3/uL (ref 0.2–0.9)
Monocytes Relative: 7 %
NEUTROS ABS: 4.9 10*3/uL (ref 1.4–6.5)
Neutrophils Relative %: 74 %
PLATELETS: 262 10*3/uL (ref 150–440)
RBC: 4.15 MIL/uL (ref 3.80–5.20)
RDW: 17.6 % — ABNORMAL HIGH (ref 11.5–14.5)
WBC: 6.7 10*3/uL (ref 3.6–11.0)

## 2015-04-07 ENCOUNTER — Inpatient Hospital Stay: Payer: Medicare Other

## 2015-04-07 DIAGNOSIS — D631 Anemia in chronic kidney disease: Secondary | ICD-10-CM

## 2015-04-07 DIAGNOSIS — N189 Chronic kidney disease, unspecified: Principal | ICD-10-CM

## 2015-04-07 DIAGNOSIS — N183 Chronic kidney disease, stage 3 (moderate): Secondary | ICD-10-CM | POA: Diagnosis not present

## 2015-04-07 LAB — HEMOGLOBIN: HEMOGLOBIN: 12.7 g/dL (ref 12.0–16.0)

## 2015-04-07 LAB — HEMATOCRIT: HCT: 39.4 % (ref 35.0–47.0)

## 2015-04-15 ENCOUNTER — Inpatient Hospital Stay: Payer: Medicare Other

## 2015-04-15 ENCOUNTER — Inpatient Hospital Stay: Payer: Medicare Other | Attending: Internal Medicine

## 2015-04-15 DIAGNOSIS — D631 Anemia in chronic kidney disease: Secondary | ICD-10-CM | POA: Diagnosis not present

## 2015-04-15 DIAGNOSIS — Z79899 Other long term (current) drug therapy: Secondary | ICD-10-CM | POA: Diagnosis not present

## 2015-04-15 DIAGNOSIS — N183 Chronic kidney disease, stage 3 (moderate): Secondary | ICD-10-CM | POA: Diagnosis present

## 2015-04-15 DIAGNOSIS — N189 Chronic kidney disease, unspecified: Secondary | ICD-10-CM

## 2015-04-15 LAB — HEMOGLOBIN: Hemoglobin: 12.4 g/dL (ref 12.0–16.0)

## 2015-04-18 ENCOUNTER — Other Ambulatory Visit: Payer: Medicare Other

## 2015-04-18 ENCOUNTER — Ambulatory Visit: Payer: Medicare Other

## 2015-04-22 ENCOUNTER — Inpatient Hospital Stay: Payer: Medicare Other

## 2015-04-22 ENCOUNTER — Inpatient Hospital Stay (HOSPITAL_BASED_OUTPATIENT_CLINIC_OR_DEPARTMENT_OTHER): Payer: Medicare Other | Admitting: Internal Medicine

## 2015-04-22 ENCOUNTER — Other Ambulatory Visit: Payer: Self-pay | Admitting: *Deleted

## 2015-04-22 VITALS — BP 113/61 | HR 80 | Temp 98.0°F | Wt 105.7 lb

## 2015-04-22 DIAGNOSIS — D631 Anemia in chronic kidney disease: Secondary | ICD-10-CM

## 2015-04-22 DIAGNOSIS — N189 Chronic kidney disease, unspecified: Principal | ICD-10-CM

## 2015-04-22 DIAGNOSIS — N183 Chronic kidney disease, stage 3 (moderate): Secondary | ICD-10-CM | POA: Diagnosis not present

## 2015-04-22 DIAGNOSIS — Z79899 Other long term (current) drug therapy: Secondary | ICD-10-CM

## 2015-04-22 LAB — HEMOGLOBIN: Hemoglobin: 12 g/dL (ref 12.0–16.0)

## 2015-04-22 NOTE — Progress Notes (Signed)
[place] OFFICE PROGRESS NOTE  Patient Care Team: Sherrin Daisy, MD as PCP - General (Family Medicine)   SUMMARY OF ONCOLOGIC HISTORY:  # ANEMIA- multi fact - Iron def/CKD; OCT 2016- START PROCRIT 40K q 2W; DEC 2016- procrit 20k q 2w  # CKD   INTERVAL HISTORY:  81 year old female patient with a history of chronic back pain and also anemia likely from iron deficiency CKD is here for follow-up. Patient is status post Feraheme 2/Procrit 40,000 units 2 [October and November 2016].  Patient's appetite is improving. She is feeling more energetic. Denies any blood in stools black stools. Cramping the legs have improved.   REVIEW OF SYSTEMS: No cough or shortness of breath.   PAST MEDICAL HISTORY :  Past Medical History  Diagnosis Date  . Stage III chronic kidney disease   . Insomnia   . Lumbar radiculitis   . Lumbar stenosis with neurogenic claudication   . Lumbar spondylosis   . Anemia   . Hypertension   . Hypothyroidism     PAST SURGICAL HISTORY :  No past surgical history on file.  FAMILY HISTORY :   Family History  Problem Relation Age of Onset  . Stroke Mother   . CAD Father   . Stomach cancer Sister   . Breast cancer Sister   . Breast cancer Sister   . Breast cancer Daughter     SOCIAL HISTORY:   Social History  Substance Use Topics  . Smoking status: Never Smoker   . Smokeless tobacco: Never Used  . Alcohol Use: No    ALLERGIES:  is allergic to celecoxib and clarithromycin.  MEDICATIONS:  Current Outpatient Prescriptions  Medication Sig Dispense Refill  . acetaminophen (TYLENOL) 325 MG tablet Take 650 mg by mouth daily after lunch.    . ALPRAZolam (XANAX) 0.25 MG tablet TAKE (1) TABLET BY MOUTH TWICE DAILY AS NEEDED FOR SLEEP/ANXIETY    . aspirin EC 81 MG tablet Take by mouth.    . Cetirizine HCl 10 MG CAPS Take by mouth.    . Cyanocobalamin 2500 MCG SUBL Place under the tongue.    . ferrous sulfate 325 (65 FE) MG EC tablet Take by  mouth 2 (two) times daily.     Marland Kitchen HYDROcodone-acetaminophen (NORCO/VICODIN) 5-325 MG per tablet 1/2-1 po tid prn Earliest Fill Date: 11/07/14    . levothyroxine (SYNTHROID, LEVOTHROID) 75 MCG tablet Take 75 mcg by mouth daily before breakfast.    . lisinopril (PRINIVIL,ZESTRIL) 5 MG tablet Take by mouth.    . Multiple Vitamin (MULTI-VITAMINS) TABS Take by mouth.    . sodium bicarbonate 650 MG tablet Take 650 mg by mouth 2 (two) times daily.     . traZODone (DESYREL) 50 MG tablet      No current facility-administered medications for this visit.    PHYSICAL EXAMINATION: ECOG PERFORMANCE STATUS: 1 - Symptomatic but completely ambulatory  BP 113/61 mmHg  Pulse 80  Temp(Src) 98 F (36.7 C) (Tympanic)  Wt 105 lb 11.4 oz (47.95 kg)  Filed Weights   04/22/15 1003  Weight: 105 lb 11.4 oz (47.95 kg)    GENERAL: Thin built cachectic. Alert, no distress and comfortable.   Accompanied by daughter. She has difficulty sitting on the exam table because of back brace/back pain. EYES: No pallor. OROPHARYNX: no thrush or ulceration; poor dentition NECK: supple, no masses felt LYMPH:  no palpable lymphadenopathy in the cervical, axillary or inguinal regions LUNGS: clear to auscultation and  No wheeze  or crackles HEART/CVS: regular rate & rhythm and no murmurs; No lower extremity edema ABDOMEN:abdomen soft, non-tender and normal bowel sounds Musculoskeletal:no cyanosis of digits and no clubbing  PSYCH: alert & oriented x 3 with fluent speech NEURO: no focal motor/sensory deficits SKIN:  no rashes or significant lesions  LABORATORY DATA:  I have reviewed the data as listed    Component Value Date/Time   NA 132* 02/24/2015 1323   NA 139 05/31/2011 1500   K 4.4 02/24/2015 1323   K 5.2* 05/31/2011 1500   CL 97* 02/24/2015 1323   CL 101 05/31/2011 1500   CO2 27 02/24/2015 1323   CO2 27 05/31/2011 1500   GLUCOSE 127* 02/24/2015 1323   GLUCOSE 94 05/31/2011 1500   BUN 26* 02/24/2015 1323   BUN  33* 05/31/2011 1500   CREATININE 1.46* 02/24/2015 1323   CREATININE 2.06* 05/31/2011 1500   CALCIUM 8.8* 02/24/2015 1323   CALCIUM 9.4 05/31/2011 1500   GFRNONAA 32* 02/24/2015 1323   GFRNONAA 25* 05/31/2011 1500   GFRAA 37* 02/24/2015 1323   GFRAA 30* 05/31/2011 1500    No results found for: SPEP, UPEP  Lab Results  Component Value Date   WBC 6.7 03/31/2015   NEUTROABS 4.9 03/31/2015   HGB 12.0 04/22/2015   HCT 39.4 04/07/2015   MCV 93.2 03/31/2015   PLT 262 03/31/2015      Chemistry      Component Value Date/Time   NA 132* 02/24/2015 1323   NA 139 05/31/2011 1500   K 4.4 02/24/2015 1323   K 5.2* 05/31/2011 1500   CL 97* 02/24/2015 1323   CL 101 05/31/2011 1500   CO2 27 02/24/2015 1323   CO2 27 05/31/2011 1500   BUN 26* 02/24/2015 1323   BUN 33* 05/31/2011 1500   CREATININE 1.46* 02/24/2015 1323   CREATININE 2.06* 05/31/2011 1500      Component Value Date/Time   CALCIUM 8.8* 02/24/2015 1323   CALCIUM 9.4 05/31/2011 1500       RADIOGRAPHIC STUDIES: I have personally reviewed the radiological images as listed and agreed with the findings in the report. No results found.   ASSESSMENT & PLAN:  #  Anemia- secondary to CKD/iron deficiency. Status post 2 Procrit injections/2 IV iron infusion [October/November 2016]; patient's hemoglobin today is 12.0 [significantly improved from 8.9 prior to starting treatments]. Hold Procrit for hemoglobin greater than 11.  # Change her Procrit to 20,000 units every 2 weeks. We'll keep checking the hemoglobin every 2 weeks; Procrit every 2 weeks as needed.  # Follow-up with me in 3 months.      Cammie Sickle, MD 04/22/2015 10:16 AM

## 2015-05-06 ENCOUNTER — Inpatient Hospital Stay: Payer: Medicare Other

## 2015-05-06 DIAGNOSIS — N183 Chronic kidney disease, stage 3 (moderate): Secondary | ICD-10-CM | POA: Diagnosis not present

## 2015-05-06 DIAGNOSIS — D631 Anemia in chronic kidney disease: Secondary | ICD-10-CM

## 2015-05-06 DIAGNOSIS — N189 Chronic kidney disease, unspecified: Principal | ICD-10-CM

## 2015-05-06 LAB — HEMOGLOBIN: Hemoglobin: 11.5 g/dL — ABNORMAL LOW (ref 12.0–16.0)

## 2015-05-19 ENCOUNTER — Inpatient Hospital Stay: Payer: Medicare Other

## 2015-05-21 ENCOUNTER — Other Ambulatory Visit: Payer: Medicare Other

## 2015-05-23 ENCOUNTER — Inpatient Hospital Stay: Payer: Medicare Other | Attending: Internal Medicine

## 2015-05-23 DIAGNOSIS — N183 Chronic kidney disease, stage 3 (moderate): Secondary | ICD-10-CM | POA: Insufficient documentation

## 2015-05-23 DIAGNOSIS — Z79899 Other long term (current) drug therapy: Secondary | ICD-10-CM | POA: Diagnosis not present

## 2015-05-23 DIAGNOSIS — N189 Chronic kidney disease, unspecified: Secondary | ICD-10-CM

## 2015-05-23 DIAGNOSIS — D631 Anemia in chronic kidney disease: Secondary | ICD-10-CM | POA: Diagnosis not present

## 2015-05-23 LAB — HEMOGLOBIN: Hemoglobin: 12 g/dL (ref 12.0–16.0)

## 2015-06-02 ENCOUNTER — Inpatient Hospital Stay: Payer: Medicare Other

## 2015-06-02 DIAGNOSIS — N189 Chronic kidney disease, unspecified: Principal | ICD-10-CM

## 2015-06-02 DIAGNOSIS — N183 Chronic kidney disease, stage 3 (moderate): Secondary | ICD-10-CM | POA: Diagnosis not present

## 2015-06-02 DIAGNOSIS — D631 Anemia in chronic kidney disease: Secondary | ICD-10-CM

## 2015-06-02 LAB — HEMOGLOBIN: HEMOGLOBIN: 11.5 g/dL — AB (ref 12.0–16.0)

## 2015-06-16 ENCOUNTER — Inpatient Hospital Stay: Payer: Medicare Other | Attending: Internal Medicine

## 2015-06-16 DIAGNOSIS — I129 Hypertensive chronic kidney disease with stage 1 through stage 4 chronic kidney disease, or unspecified chronic kidney disease: Secondary | ICD-10-CM | POA: Diagnosis not present

## 2015-06-16 DIAGNOSIS — N183 Chronic kidney disease, stage 3 (moderate): Secondary | ICD-10-CM | POA: Diagnosis not present

## 2015-06-16 DIAGNOSIS — Z79899 Other long term (current) drug therapy: Secondary | ICD-10-CM | POA: Insufficient documentation

## 2015-06-16 DIAGNOSIS — D631 Anemia in chronic kidney disease: Secondary | ICD-10-CM | POA: Diagnosis not present

## 2015-06-16 DIAGNOSIS — N189 Chronic kidney disease, unspecified: Secondary | ICD-10-CM

## 2015-06-16 LAB — HEMOGLOBIN: Hemoglobin: 12.2 g/dL (ref 12.0–16.0)

## 2015-06-30 ENCOUNTER — Other Ambulatory Visit: Payer: Self-pay | Admitting: Internal Medicine

## 2015-06-30 ENCOUNTER — Inpatient Hospital Stay: Payer: Medicare Other

## 2015-06-30 DIAGNOSIS — I129 Hypertensive chronic kidney disease with stage 1 through stage 4 chronic kidney disease, or unspecified chronic kidney disease: Secondary | ICD-10-CM | POA: Diagnosis not present

## 2015-06-30 DIAGNOSIS — D631 Anemia in chronic kidney disease: Secondary | ICD-10-CM

## 2015-06-30 DIAGNOSIS — N189 Chronic kidney disease, unspecified: Principal | ICD-10-CM

## 2015-06-30 LAB — HEMOGLOBIN: Hemoglobin: 11.4 g/dL — ABNORMAL LOW (ref 12.0–16.0)

## 2015-07-14 ENCOUNTER — Inpatient Hospital Stay: Payer: Medicare Other | Attending: Internal Medicine

## 2015-07-14 DIAGNOSIS — I129 Hypertensive chronic kidney disease with stage 1 through stage 4 chronic kidney disease, or unspecified chronic kidney disease: Secondary | ICD-10-CM | POA: Diagnosis not present

## 2015-07-14 DIAGNOSIS — Z79899 Other long term (current) drug therapy: Secondary | ICD-10-CM | POA: Insufficient documentation

## 2015-07-14 DIAGNOSIS — I10 Essential (primary) hypertension: Secondary | ICD-10-CM | POA: Diagnosis not present

## 2015-07-14 DIAGNOSIS — D631 Anemia in chronic kidney disease: Secondary | ICD-10-CM | POA: Diagnosis not present

## 2015-07-14 DIAGNOSIS — D509 Iron deficiency anemia, unspecified: Secondary | ICD-10-CM | POA: Diagnosis not present

## 2015-07-14 DIAGNOSIS — N189 Chronic kidney disease, unspecified: Secondary | ICD-10-CM | POA: Insufficient documentation

## 2015-07-14 LAB — FERRITIN: FERRITIN: 346 ng/mL — AB (ref 11–307)

## 2015-07-14 LAB — HEMOGLOBIN: Hemoglobin: 11.6 g/dL — ABNORMAL LOW (ref 12.0–16.0)

## 2015-07-28 ENCOUNTER — Other Ambulatory Visit: Payer: Self-pay | Admitting: Internal Medicine

## 2015-07-28 ENCOUNTER — Other Ambulatory Visit: Payer: Self-pay | Admitting: *Deleted

## 2015-07-28 DIAGNOSIS — D631 Anemia in chronic kidney disease: Secondary | ICD-10-CM

## 2015-07-28 DIAGNOSIS — N189 Chronic kidney disease, unspecified: Principal | ICD-10-CM

## 2015-07-29 ENCOUNTER — Ambulatory Visit: Payer: Medicare Other

## 2015-07-29 ENCOUNTER — Inpatient Hospital Stay: Payer: Medicare Other

## 2015-07-29 ENCOUNTER — Encounter: Payer: Self-pay | Admitting: Internal Medicine

## 2015-07-29 ENCOUNTER — Ambulatory Visit (HOSPITAL_BASED_OUTPATIENT_CLINIC_OR_DEPARTMENT_OTHER): Payer: Medicare Other | Admitting: Internal Medicine

## 2015-07-29 VITALS — BP 129/73 | HR 73 | Temp 96.9°F | Resp 18 | Ht 60.0 in | Wt 105.2 lb

## 2015-07-29 DIAGNOSIS — N189 Chronic kidney disease, unspecified: Principal | ICD-10-CM

## 2015-07-29 DIAGNOSIS — Z79899 Other long term (current) drug therapy: Secondary | ICD-10-CM

## 2015-07-29 DIAGNOSIS — I129 Hypertensive chronic kidney disease with stage 1 through stage 4 chronic kidney disease, or unspecified chronic kidney disease: Secondary | ICD-10-CM

## 2015-07-29 DIAGNOSIS — D631 Anemia in chronic kidney disease: Secondary | ICD-10-CM

## 2015-07-29 LAB — CBC WITH DIFFERENTIAL/PLATELET
Basophils Absolute: 0.1 10*3/uL (ref 0–0.1)
Basophils Relative: 1 %
EOS ABS: 0.2 10*3/uL (ref 0–0.7)
Eosinophils Relative: 3 %
HEMATOCRIT: 36 % (ref 35.0–47.0)
HEMOGLOBIN: 11.9 g/dL — AB (ref 12.0–16.0)
LYMPHS ABS: 1.5 10*3/uL (ref 1.0–3.6)
Lymphocytes Relative: 18 %
MCH: 32.3 pg (ref 26.0–34.0)
MCHC: 32.9 g/dL (ref 32.0–36.0)
MCV: 98.1 fL (ref 80.0–100.0)
Monocytes Absolute: 0.9 10*3/uL (ref 0.2–0.9)
Monocytes Relative: 10 %
NEUTROS ABS: 5.8 10*3/uL (ref 1.4–6.5)
NEUTROS PCT: 68 %
Platelets: 330 10*3/uL (ref 150–440)
RBC: 3.67 MIL/uL — ABNORMAL LOW (ref 3.80–5.20)
RDW: 12.5 % (ref 11.5–14.5)
WBC: 8.5 10*3/uL (ref 3.6–11.0)

## 2015-07-29 LAB — BASIC METABOLIC PANEL
Anion gap: 4 — ABNORMAL LOW (ref 5–15)
BUN: 27 mg/dL — AB (ref 6–20)
CHLORIDE: 101 mmol/L (ref 101–111)
CO2: 28 mmol/L (ref 22–32)
Calcium: 9.2 mg/dL (ref 8.9–10.3)
Creatinine, Ser: 1.28 mg/dL — ABNORMAL HIGH (ref 0.44–1.00)
GFR calc Af Amer: 43 mL/min — ABNORMAL LOW (ref >60)
GFR calc non Af Amer: 37 mL/min — ABNORMAL LOW (ref >60)
GLUCOSE: 71 mg/dL (ref 65–99)
POTASSIUM: 4.2 mmol/L (ref 3.5–5.1)
SODIUM: 133 mmol/L — AB (ref 135–145)

## 2015-07-29 NOTE — Progress Notes (Signed)
[place] OFFICE PROGRESS NOTE  Patient Care Team: Sherrin Daisy, MD as PCP - General (Family Medicine)   SUMMARY OF ONCOLOGIC HISTORY:  # ANEMIA- multi fact - Iron def/CKD; OCT 2016- START PROCRIT 40K q 2W; DEC 2016- procrit 20k q ; March 2017- Start Procrit 150 mcg q 4W # CKD [Dr.Lateef]  INTERVAL HISTORY:  82 year old female patient with a history of chronic back pain and also anemia likely from iron deficiency CKD is here for follow-up. Patient is status post Feraheme 2/Procrit 40,000 units 2 [October and November 2016].   Patient denies any worsening fatigue. She is chronic fatigue/feels "cold all the time". No nausea no vomiting. She recently had a mechanical fall. However this was witnessed; and she did not hurt herself too bad.   Denies any blood in stools black stools. Cramping the legs have improved.   REVIEW OF SYSTEMS: No cough or shortness of breath. Patient has chronic back pain for which she gets injections.   PAST MEDICAL HISTORY :  Past Medical History  Diagnosis Date  . Stage III chronic kidney disease   . Insomnia   . Lumbar radiculitis   . Lumbar stenosis with neurogenic claudication   . Lumbar spondylosis   . Anemia   . Hypertension   . Hypothyroidism     PAST SURGICAL HISTORY :   Past Surgical History  Procedure Laterality Date  . Appendectomy    . Abdominal hysterectomy    . Total knee arthroplasty Left 2002    FAMILY HISTORY :   Family History  Problem Relation Age of Onset  . Stroke Mother   . CAD Father   . Stomach cancer Sister   . Breast cancer Sister   . Breast cancer Sister   . Breast cancer Daughter     SOCIAL HISTORY:   Social History  Substance Use Topics  . Smoking status: Never Smoker   . Smokeless tobacco: Never Used  . Alcohol Use: No    ALLERGIES:  is allergic to celecoxib and clarithromycin.  MEDICATIONS:  Current Outpatient Prescriptions  Medication Sig Dispense Refill  . acetaminophen  (TYLENOL) 325 MG tablet Take 650 mg by mouth daily after lunch.    . ALPRAZolam (XANAX) 0.25 MG tablet TAKE (1) TABLET BY MOUTH TWICE DAILY AS NEEDED FOR SLEEP/ANXIETY    . aspirin EC 81 MG tablet Take by mouth.    . Cetirizine HCl 10 MG CAPS Take by mouth.    . Cyanocobalamin 2500 MCG SUBL Place under the tongue.    . ferrous sulfate 325 (65 FE) MG EC tablet Take by mouth 2 (two) times daily.     Marland Kitchen HYDROcodone-acetaminophen (NORCO/VICODIN) 5-325 MG per tablet 1/2-1 po tid prn Earliest Fill Date: 11/07/14    . levothyroxine (SYNTHROID, LEVOTHROID) 75 MCG tablet Take 75 mcg by mouth daily before breakfast.    . lisinopril (PRINIVIL,ZESTRIL) 5 MG tablet Take by mouth.    . Multiple Vitamin (MULTI-VITAMINS) TABS Take by mouth.    . sodium bicarbonate 650 MG tablet Take 650 mg by mouth 2 (two) times daily.     . traZODone (DESYREL) 50 MG tablet      No current facility-administered medications for this visit.    PHYSICAL EXAMINATION: ECOG PERFORMANCE STATUS: 1 - Symptomatic but completely ambulatory  BP 129/73 mmHg  Pulse 73  Temp(Src) 96.9 F (36.1 C) (Tympanic)  Resp 18  Ht 5' (1.524 m)  Wt 105 lb 2.6 oz (47.7 kg)  BMI 20.54 kg/m2  Filed Weights   07/29/15 0839  Weight: 105 lb 2.6 oz (47.7 kg)    GENERAL: Thin built cachectic. Alert, no distress and comfortable.   Accompanied by daughter. She has difficulty sitting on the exam table because of back brace/back pain. EYES: No pallor. OROPHARYNX: no thrush or ulceration; poor dentition NECK: supple, no masses felt LYMPH:  no palpable lymphadenopathy in the cervical, axillary or inguinal regions LUNGS: clear to auscultation and  No wheeze or crackles HEART/CVS: regular rate & rhythm and no murmurs; No lower extremity edema ABDOMEN:abdomen soft, non-tender and normal bowel sounds Musculoskeletal:no cyanosis of digits and no clubbing  PSYCH: alert & oriented x 3 with fluent speech NEURO: no focal motor/sensory deficits SKIN:  no  rashes or significant lesions  LABORATORY DATA:  I have reviewed the data as listed    Component Value Date/Time   NA 133* 07/29/2015 0810   NA 139 05/31/2011 1500   K 4.2 07/29/2015 0810   K 5.2* 05/31/2011 1500   CL 101 07/29/2015 0810   CL 101 05/31/2011 1500   CO2 28 07/29/2015 0810   CO2 27 05/31/2011 1500   GLUCOSE 71 07/29/2015 0810   GLUCOSE 94 05/31/2011 1500   BUN 27* 07/29/2015 0810   BUN 33* 05/31/2011 1500   CREATININE 1.28* 07/29/2015 0810   CREATININE 2.06* 05/31/2011 1500   CALCIUM 9.2 07/29/2015 0810   CALCIUM 9.4 05/31/2011 1500   GFRNONAA 37* 07/29/2015 0810   GFRNONAA 25* 05/31/2011 1500   GFRAA 43* 07/29/2015 0810   GFRAA 30* 05/31/2011 1500    No results found for: SPEP, UPEP  Lab Results  Component Value Date   WBC 8.5 07/29/2015   NEUTROABS 5.8 07/29/2015   HGB 11.9* 07/29/2015   HCT 36.0 07/29/2015   MCV 98.1 07/29/2015   PLT 330 07/29/2015      Chemistry      Component Value Date/Time   NA 133* 07/29/2015 0810   NA 139 05/31/2011 1500   K 4.2 07/29/2015 0810   K 5.2* 05/31/2011 1500   CL 101 07/29/2015 0810   CL 101 05/31/2011 1500   CO2 28 07/29/2015 0810   CO2 27 05/31/2011 1500   BUN 27* 07/29/2015 0810   BUN 33* 05/31/2011 1500   CREATININE 1.28* 07/29/2015 0810   CREATININE 2.06* 05/31/2011 1500      Component Value Date/Time   CALCIUM 9.2 07/29/2015 0810   CALCIUM 9.4 05/31/2011 1500       ASSESSMENT & PLAN:  #  Anemia- secondary to CKD/iron deficiency. Status post 2 Procrit injections/2 IV iron infusion [October/November 2016]; patient's hemoglobin today is 11.9 [significantly improved from 8.9 prior to starting treatments]. Hold Procrit for hemoglobin greater than 10. Also I would like to switch her from Procrit to Aranesp 150 g every 4 weeks. New orders are in.   # Since patient has been stable for the last few months- I would recommend spacing out the CBC checks to every 4 weeks/possible Procrit. We will check iron  studies in 4 weeks/ferritin- if needed will add IV iron.  # Patient will see me back in 3 months/labs/possible Aranesp.     Cammie Sickle, MD 07/29/2015 8:53 AM

## 2015-07-29 NOTE — Progress Notes (Signed)
Pt states fatigue is the same but she cooks three times a day and cleans her house.  She does not see blood in urine or stool.  Pain in her back and she got steroid inj, last week

## 2015-08-25 ENCOUNTER — Inpatient Hospital Stay: Payer: Medicare Other

## 2015-08-25 ENCOUNTER — Inpatient Hospital Stay: Payer: Medicare Other | Attending: Internal Medicine

## 2015-08-25 DIAGNOSIS — N189 Chronic kidney disease, unspecified: Secondary | ICD-10-CM | POA: Insufficient documentation

## 2015-08-25 DIAGNOSIS — D631 Anemia in chronic kidney disease: Secondary | ICD-10-CM | POA: Insufficient documentation

## 2015-08-25 DIAGNOSIS — I129 Hypertensive chronic kidney disease with stage 1 through stage 4 chronic kidney disease, or unspecified chronic kidney disease: Secondary | ICD-10-CM | POA: Diagnosis not present

## 2015-08-25 DIAGNOSIS — D509 Iron deficiency anemia, unspecified: Secondary | ICD-10-CM | POA: Insufficient documentation

## 2015-08-25 DIAGNOSIS — Z79899 Other long term (current) drug therapy: Secondary | ICD-10-CM | POA: Diagnosis not present

## 2015-08-25 LAB — CBC WITH DIFFERENTIAL/PLATELET
BASOS ABS: 0.1 10*3/uL (ref 0–0.1)
BASOS PCT: 2 %
Eosinophils Absolute: 0.2 10*3/uL (ref 0–0.7)
Eosinophils Relative: 3 %
HEMATOCRIT: 36.3 % (ref 35.0–47.0)
HEMOGLOBIN: 12.1 g/dL (ref 12.0–16.0)
LYMPHS PCT: 17 %
Lymphs Abs: 1.1 10*3/uL (ref 1.0–3.6)
MCH: 32.1 pg (ref 26.0–34.0)
MCHC: 33.3 g/dL (ref 32.0–36.0)
MCV: 96.6 fL (ref 80.0–100.0)
Monocytes Absolute: 0.7 10*3/uL (ref 0.2–0.9)
Monocytes Relative: 10 %
NEUTROS ABS: 4.4 10*3/uL (ref 1.4–6.5)
NEUTROS PCT: 68 %
Platelets: 404 10*3/uL (ref 150–440)
RBC: 3.76 MIL/uL — AB (ref 3.80–5.20)
RDW: 12.3 % (ref 11.5–14.5)
WBC: 6.4 10*3/uL (ref 3.6–11.0)

## 2015-08-25 LAB — IRON AND TIBC
Iron: 51 ug/dL (ref 28–170)
Saturation Ratios: 24 % (ref 10.4–31.8)
TIBC: 214 ug/dL — ABNORMAL LOW (ref 250–450)
UIBC: 163 ug/dL

## 2015-08-25 LAB — FERRITIN: Ferritin: 335 ng/mL — ABNORMAL HIGH (ref 11–307)

## 2015-08-26 ENCOUNTER — Ambulatory Visit: Payer: Medicare Other

## 2015-08-26 ENCOUNTER — Other Ambulatory Visit: Payer: Medicare Other

## 2015-09-22 ENCOUNTER — Inpatient Hospital Stay: Payer: Medicare Other

## 2015-09-22 ENCOUNTER — Inpatient Hospital Stay: Payer: Medicare Other | Attending: Internal Medicine

## 2015-09-22 DIAGNOSIS — D509 Iron deficiency anemia, unspecified: Secondary | ICD-10-CM | POA: Insufficient documentation

## 2015-09-22 DIAGNOSIS — Z79899 Other long term (current) drug therapy: Secondary | ICD-10-CM | POA: Diagnosis not present

## 2015-09-22 DIAGNOSIS — N189 Chronic kidney disease, unspecified: Secondary | ICD-10-CM | POA: Insufficient documentation

## 2015-09-22 DIAGNOSIS — I129 Hypertensive chronic kidney disease with stage 1 through stage 4 chronic kidney disease, or unspecified chronic kidney disease: Secondary | ICD-10-CM | POA: Insufficient documentation

## 2015-09-22 DIAGNOSIS — D631 Anemia in chronic kidney disease: Secondary | ICD-10-CM | POA: Insufficient documentation

## 2015-09-22 LAB — HEMOGLOBIN: HEMOGLOBIN: 11.9 g/dL — AB (ref 12.0–16.0)

## 2015-10-21 ENCOUNTER — Inpatient Hospital Stay: Payer: Medicare Other

## 2015-10-21 ENCOUNTER — Inpatient Hospital Stay (HOSPITAL_BASED_OUTPATIENT_CLINIC_OR_DEPARTMENT_OTHER): Payer: Medicare Other | Admitting: Internal Medicine

## 2015-10-21 ENCOUNTER — Inpatient Hospital Stay: Payer: Medicare Other | Attending: Internal Medicine

## 2015-10-21 VITALS — BP 120/74 | HR 72 | Temp 97.5°F | Resp 18 | Wt 105.4 lb

## 2015-10-21 DIAGNOSIS — Z96652 Presence of left artificial knee joint: Secondary | ICD-10-CM | POA: Diagnosis not present

## 2015-10-21 DIAGNOSIS — D631 Anemia in chronic kidney disease: Secondary | ICD-10-CM | POA: Diagnosis not present

## 2015-10-21 DIAGNOSIS — Z7982 Long term (current) use of aspirin: Secondary | ICD-10-CM | POA: Diagnosis not present

## 2015-10-21 DIAGNOSIS — I129 Hypertensive chronic kidney disease with stage 1 through stage 4 chronic kidney disease, or unspecified chronic kidney disease: Secondary | ICD-10-CM | POA: Diagnosis present

## 2015-10-21 DIAGNOSIS — N189 Chronic kidney disease, unspecified: Secondary | ICD-10-CM

## 2015-10-21 DIAGNOSIS — N183 Chronic kidney disease, stage 3 (moderate): Secondary | ICD-10-CM

## 2015-10-21 DIAGNOSIS — Z79899 Other long term (current) drug therapy: Secondary | ICD-10-CM | POA: Diagnosis not present

## 2015-10-21 DIAGNOSIS — E039 Hypothyroidism, unspecified: Secondary | ICD-10-CM | POA: Diagnosis not present

## 2015-10-21 LAB — CBC WITH DIFFERENTIAL/PLATELET
BASOS ABS: 0.1 10*3/uL (ref 0–0.1)
Basophils Relative: 1 %
EOS ABS: 0.2 10*3/uL (ref 0–0.7)
EOS PCT: 4 %
HCT: 36.5 % (ref 35.0–47.0)
Hemoglobin: 12 g/dL (ref 12.0–16.0)
Lymphocytes Relative: 22 %
Lymphs Abs: 1.4 10*3/uL (ref 1.0–3.6)
MCH: 31.7 pg (ref 26.0–34.0)
MCHC: 32.8 g/dL (ref 32.0–36.0)
MCV: 96.4 fL (ref 80.0–100.0)
Monocytes Absolute: 0.5 10*3/uL (ref 0.2–0.9)
Monocytes Relative: 8 %
NEUTROS PCT: 65 %
Neutro Abs: 4.3 10*3/uL (ref 1.4–6.5)
PLATELETS: 300 10*3/uL (ref 150–440)
RBC: 3.79 MIL/uL — AB (ref 3.80–5.20)
RDW: 13.1 % (ref 11.5–14.5)
WBC: 6.5 10*3/uL (ref 3.6–11.0)

## 2015-10-21 LAB — IRON AND TIBC
IRON: 49 ug/dL (ref 28–170)
SATURATION RATIOS: 24 % (ref 10.4–31.8)
TIBC: 205 ug/dL — AB (ref 250–450)
UIBC: 156 ug/dL

## 2015-10-21 LAB — BASIC METABOLIC PANEL
Anion gap: 6 (ref 5–15)
BUN: 23 mg/dL — AB (ref 6–20)
CALCIUM: 9.3 mg/dL (ref 8.9–10.3)
CO2: 27 mmol/L (ref 22–32)
CREATININE: 1.24 mg/dL — AB (ref 0.44–1.00)
Chloride: 100 mmol/L — ABNORMAL LOW (ref 101–111)
GFR calc Af Amer: 45 mL/min — ABNORMAL LOW (ref >60)
GFR calc non Af Amer: 39 mL/min — ABNORMAL LOW (ref >60)
GLUCOSE: 107 mg/dL — AB (ref 65–99)
Potassium: 4.7 mmol/L (ref 3.5–5.1)
Sodium: 133 mmol/L — ABNORMAL LOW (ref 135–145)

## 2015-10-21 LAB — FERRITIN: FERRITIN: 343 ng/mL — AB (ref 11–307)

## 2015-10-21 NOTE — Progress Notes (Signed)
[place] OFFICE PROGRESS NOTE  Patient Care Team: Sherrin Daisy, MD as PCP - General (Family Medicine)   SUMMARY OF ONCOLOGIC HISTORY:  # ANEMIA- multi fact - Iron def/CKD; OCT 2016- START PROCRIT 40K q 2W; DEC 2016- procrit 20k q ; March 2017- Start Procrit 150 mcg q 4W # CKD [Dr.Lateef]  INTERVAL HISTORY:  82 year old female patient with a history of chronic back pain and also anemia likely from iron deficiency CKD is here for follow-up. Patient is status post Feraheme 2/Procrit 40,000 units 2 [October and November 2016].   Patient denies any new onset of fatigue. She has mild dizziness; mostly orthostatic.    Denies any blood in stools black stools. Cramping the legs have improved. No cough or shortness of breath.  REVIEW OF SYSTEMS:  Patient has chronic back pain for which she gets injections. A complete 10 point review of system is done which is negative for mentioned above in history of present illness.  PAST MEDICAL HISTORY :  Past Medical History  Diagnosis Date  . Stage III chronic kidney disease   . Insomnia   . Lumbar radiculitis   . Lumbar stenosis with neurogenic claudication   . Lumbar spondylosis   . Anemia   . Hypertension   . Hypothyroidism     PAST SURGICAL HISTORY :   Past Surgical History  Procedure Laterality Date  . Appendectomy    . Abdominal hysterectomy    . Total knee arthroplasty Left 2002    FAMILY HISTORY :   Family History  Problem Relation Age of Onset  . Stroke Mother   . CAD Father   . Stomach cancer Sister   . Breast cancer Sister   . Breast cancer Sister   . Breast cancer Daughter     SOCIAL HISTORY:   Social History  Substance Use Topics  . Smoking status: Never Smoker   . Smokeless tobacco: Never Used  . Alcohol Use: No    ALLERGIES:  is allergic to celecoxib and clarithromycin.  MEDICATIONS:  Current Outpatient Prescriptions  Medication Sig Dispense Refill  . acetaminophen (TYLENOL) 325 MG  tablet Take 650 mg by mouth daily after lunch.    . ALPRAZolam (XANAX) 0.25 MG tablet TAKE (1) TABLET BY MOUTH TWICE DAILY AS NEEDED FOR SLEEP/ANXIETY    . aspirin EC 81 MG tablet Take by mouth.    . Cetirizine HCl 10 MG CAPS Take by mouth.    . Cyanocobalamin 2500 MCG SUBL Place under the tongue.    . ferrous sulfate 325 (65 FE) MG EC tablet Take by mouth 2 (two) times daily.     Marland Kitchen HYDROcodone-acetaminophen (NORCO/VICODIN) 5-325 MG per tablet 1/2-1 po tid prn Earliest Fill Date: 11/07/14    . levothyroxine (SYNTHROID, LEVOTHROID) 75 MCG tablet Take 75 mcg by mouth daily before breakfast.    . lisinopril (PRINIVIL,ZESTRIL) 5 MG tablet Take by mouth.    . Multiple Vitamin (MULTI-VITAMINS) TABS Take by mouth.    . sodium bicarbonate 650 MG tablet Take 650 mg by mouth 2 (two) times daily.     . traZODone (DESYREL) 50 MG tablet      No current facility-administered medications for this visit.    PHYSICAL EXAMINATION: ECOG PERFORMANCE STATUS: 1 - Symptomatic but completely ambulatory  BP 120/74 mmHg  Pulse 72  Temp(Src) 97.5 F (36.4 C) (Tympanic)  Resp 18  Wt 105 lb 6.1 oz (47.8 kg)  Filed Weights   10/21/15 0853  Weight: 105 lb  6.1 oz (47.8 kg)    GENERAL: Thin built cachectic. Alert, no distress and comfortable.   Accompanied by daughter. She has difficulty sitting on the exam table because of back brace/back pain. EYES: No pallor. OROPHARYNX: no thrush or ulceration; poor dentition NECK: supple, no masses felt LYMPH:  no palpable lymphadenopathy in the cervical, axillary or inguinal regions LUNGS: clear to auscultation and  No wheeze or crackles HEART/CVS: regular rate & rhythm and no murmurs; No lower extremity edema ABDOMEN:abdomen soft, non-tender and normal bowel sounds Musculoskeletal:no cyanosis of digits and no clubbing  PSYCH: alert & oriented x 3 with fluent speech NEURO: no focal motor/sensory deficits SKIN:  no rashes or significant lesions  LABORATORY DATA:  I  have reviewed the data as listed    Component Value Date/Time   NA 133* 10/21/2015 0831   NA 139 05/31/2011 1500   K 4.7 10/21/2015 0831   K 5.2* 05/31/2011 1500   CL 100* 10/21/2015 0831   CL 101 05/31/2011 1500   CO2 27 10/21/2015 0831   CO2 27 05/31/2011 1500   GLUCOSE 107* 10/21/2015 0831   GLUCOSE 94 05/31/2011 1500   BUN 23* 10/21/2015 0831   BUN 33* 05/31/2011 1500   CREATININE 1.24* 10/21/2015 0831   CREATININE 2.06* 05/31/2011 1500   CALCIUM 9.3 10/21/2015 0831   CALCIUM 9.4 05/31/2011 1500   GFRNONAA 39* 10/21/2015 0831   GFRNONAA 25* 05/31/2011 1500   GFRAA 45* 10/21/2015 0831   GFRAA 30* 05/31/2011 1500    No results found for: SPEP, UPEP  Lab Results  Component Value Date   WBC 6.5 10/21/2015   NEUTROABS 4.3 10/21/2015   HGB 12.0 10/21/2015   HCT 36.5 10/21/2015   MCV 96.4 10/21/2015   PLT 300 10/21/2015      Chemistry      Component Value Date/Time   NA 133* 10/21/2015 0831   NA 139 05/31/2011 1500   K 4.7 10/21/2015 0831   K 5.2* 05/31/2011 1500   CL 100* 10/21/2015 0831   CL 101 05/31/2011 1500   CO2 27 10/21/2015 0831   CO2 27 05/31/2011 1500   BUN 23* 10/21/2015 0831   BUN 33* 05/31/2011 1500   CREATININE 1.24* 10/21/2015 0831   CREATININE 2.06* 05/31/2011 1500      Component Value Date/Time   CALCIUM 9.3 10/21/2015 0831   CALCIUM 9.4 05/31/2011 1500       ASSESSMENT & PLAN:  #  Anemia- secondary to CKD/iron deficiency. Status post 2 Procrit injections/2 IV iron infusion [October/November 2016]; Interestingly patient has not needed any more treatments since then. Patient's hemoglobin today is 12 [significantly improved from 8.9 prior to starting treatments]. Aranesp has been ordered  # Check H&H iron studies every 2 months possible Aranesp; follow-up with me in 6 months with CBC and BMP iron studies. Patient daughter agreed with the plan.     Cammie Sickle, MD 10/21/2015 9:18 AM

## 2015-12-23 ENCOUNTER — Other Ambulatory Visit: Payer: Medicare Other

## 2015-12-23 ENCOUNTER — Ambulatory Visit: Payer: Medicare Other

## 2015-12-26 ENCOUNTER — Inpatient Hospital Stay: Payer: Medicare Other

## 2015-12-26 ENCOUNTER — Inpatient Hospital Stay: Payer: Medicare Other | Attending: Internal Medicine

## 2015-12-26 DIAGNOSIS — N189 Chronic kidney disease, unspecified: Secondary | ICD-10-CM

## 2015-12-26 DIAGNOSIS — D631 Anemia in chronic kidney disease: Secondary | ICD-10-CM | POA: Insufficient documentation

## 2015-12-26 DIAGNOSIS — Z79899 Other long term (current) drug therapy: Secondary | ICD-10-CM | POA: Insufficient documentation

## 2015-12-26 DIAGNOSIS — I129 Hypertensive chronic kidney disease with stage 1 through stage 4 chronic kidney disease, or unspecified chronic kidney disease: Secondary | ICD-10-CM | POA: Diagnosis not present

## 2015-12-26 DIAGNOSIS — N183 Chronic kidney disease, stage 3 (moderate): Secondary | ICD-10-CM | POA: Insufficient documentation

## 2015-12-26 LAB — FERRITIN: Ferritin: 501 ng/mL — ABNORMAL HIGH (ref 11–307)

## 2015-12-26 LAB — IRON AND TIBC
IRON: 30 ug/dL (ref 28–170)
Saturation Ratios: 15 % (ref 10.4–31.8)
TIBC: 202 ug/dL — ABNORMAL LOW (ref 250–450)
UIBC: 172 ug/dL

## 2015-12-26 LAB — HEMATOCRIT: HEMATOCRIT: 35.3 % (ref 35.0–47.0)

## 2015-12-26 LAB — HEMOGLOBIN: HEMOGLOBIN: 11.6 g/dL — AB (ref 12.0–16.0)

## 2016-02-24 ENCOUNTER — Ambulatory Visit: Payer: Medicare Other

## 2016-02-24 ENCOUNTER — Other Ambulatory Visit: Payer: Medicare Other

## 2016-03-05 ENCOUNTER — Inpatient Hospital Stay: Payer: Medicare Other

## 2016-03-05 ENCOUNTER — Inpatient Hospital Stay: Payer: Medicare Other | Attending: Internal Medicine

## 2016-03-05 DIAGNOSIS — D631 Anemia in chronic kidney disease: Secondary | ICD-10-CM | POA: Diagnosis not present

## 2016-03-05 DIAGNOSIS — N183 Chronic kidney disease, stage 3 (moderate): Secondary | ICD-10-CM | POA: Diagnosis not present

## 2016-03-05 DIAGNOSIS — N189 Chronic kidney disease, unspecified: Secondary | ICD-10-CM

## 2016-03-05 DIAGNOSIS — I129 Hypertensive chronic kidney disease with stage 1 through stage 4 chronic kidney disease, or unspecified chronic kidney disease: Secondary | ICD-10-CM | POA: Diagnosis not present

## 2016-03-05 DIAGNOSIS — Z79899 Other long term (current) drug therapy: Secondary | ICD-10-CM | POA: Diagnosis not present

## 2016-03-05 LAB — HEMATOCRIT: HEMATOCRIT: 34.6 % — AB (ref 35.0–47.0)

## 2016-03-05 LAB — IRON AND TIBC
Iron: 51 ug/dL (ref 28–170)
Saturation Ratios: 25 % (ref 10.4–31.8)
TIBC: 202 ug/dL — ABNORMAL LOW (ref 250–450)
UIBC: 151 ug/dL

## 2016-03-05 LAB — HEMOGLOBIN: Hemoglobin: 11.4 g/dL — ABNORMAL LOW (ref 12.0–16.0)

## 2016-03-05 LAB — FERRITIN: Ferritin: 338 ng/mL — ABNORMAL HIGH (ref 11–307)

## 2016-04-07 ENCOUNTER — Telehealth: Payer: Self-pay | Admitting: Internal Medicine

## 2016-04-07 NOTE — Telephone Encounter (Signed)
Pt has conflict with next appt. Can she just get lab/aranesp on next visit and see MD in January or February? Please advise.

## 2016-04-07 NOTE — Telephone Encounter (Signed)
That is fine, he wants to see her in January

## 2016-04-27 ENCOUNTER — Other Ambulatory Visit: Payer: Medicare Other

## 2016-04-27 ENCOUNTER — Ambulatory Visit: Payer: Medicare Other | Admitting: Internal Medicine

## 2016-04-27 ENCOUNTER — Ambulatory Visit: Payer: Medicare Other

## 2016-05-04 ENCOUNTER — Other Ambulatory Visit: Payer: Medicare Other

## 2016-05-04 ENCOUNTER — Ambulatory Visit: Payer: Medicare Other

## 2016-05-04 ENCOUNTER — Ambulatory Visit: Payer: Medicare Other | Admitting: Internal Medicine

## 2016-05-05 ENCOUNTER — Inpatient Hospital Stay: Payer: Medicare Other

## 2016-05-05 ENCOUNTER — Inpatient Hospital Stay: Payer: Medicare Other | Attending: Internal Medicine

## 2016-05-05 DIAGNOSIS — D631 Anemia in chronic kidney disease: Secondary | ICD-10-CM | POA: Diagnosis not present

## 2016-05-05 DIAGNOSIS — I129 Hypertensive chronic kidney disease with stage 1 through stage 4 chronic kidney disease, or unspecified chronic kidney disease: Secondary | ICD-10-CM | POA: Insufficient documentation

## 2016-05-05 DIAGNOSIS — N189 Chronic kidney disease, unspecified: Secondary | ICD-10-CM

## 2016-05-05 DIAGNOSIS — N183 Chronic kidney disease, stage 3 (moderate): Secondary | ICD-10-CM | POA: Diagnosis not present

## 2016-05-05 DIAGNOSIS — Z79899 Other long term (current) drug therapy: Secondary | ICD-10-CM | POA: Diagnosis not present

## 2016-05-05 LAB — IRON AND TIBC
IRON: 68 ug/dL (ref 28–170)
Saturation Ratios: 27 % (ref 10.4–31.8)
TIBC: 253 ug/dL (ref 250–450)
UIBC: 185 ug/dL

## 2016-05-05 LAB — CBC WITH DIFFERENTIAL/PLATELET
Basophils Absolute: 0.1 10*3/uL (ref 0–0.1)
Basophils Relative: 1 %
EOS PCT: 3 %
Eosinophils Absolute: 0.1 10*3/uL (ref 0–0.7)
HEMATOCRIT: 36.9 % (ref 35.0–47.0)
HEMOGLOBIN: 12 g/dL (ref 12.0–16.0)
LYMPHS ABS: 1.1 10*3/uL (ref 1.0–3.6)
LYMPHS PCT: 21 %
MCH: 31.3 pg (ref 26.0–34.0)
MCHC: 32.6 g/dL (ref 32.0–36.0)
MCV: 96.2 fL (ref 80.0–100.0)
Monocytes Absolute: 0.5 10*3/uL (ref 0.2–0.9)
Monocytes Relative: 10 %
Neutro Abs: 3.4 10*3/uL (ref 1.4–6.5)
Neutrophils Relative %: 65 %
PLATELETS: 270 10*3/uL (ref 150–440)
RBC: 3.83 MIL/uL (ref 3.80–5.20)
RDW: 12.7 % (ref 11.5–14.5)
WBC: 5.2 10*3/uL (ref 3.6–11.0)

## 2016-05-05 LAB — FERRITIN: Ferritin: 211 ng/mL (ref 11–307)

## 2016-05-11 ENCOUNTER — Other Ambulatory Visit: Payer: Medicare Other

## 2016-05-11 ENCOUNTER — Ambulatory Visit: Payer: Medicare Other

## 2016-05-11 ENCOUNTER — Ambulatory Visit: Payer: Medicare Other | Admitting: Internal Medicine

## 2016-08-13 ENCOUNTER — Other Ambulatory Visit: Payer: Medicare Other

## 2016-08-17 ENCOUNTER — Ambulatory Visit: Payer: Medicare Other

## 2016-08-17 ENCOUNTER — Ambulatory Visit: Payer: Medicare Other | Admitting: Internal Medicine

## 2016-08-24 ENCOUNTER — Inpatient Hospital Stay: Payer: Medicare Other

## 2016-08-24 ENCOUNTER — Telehealth: Payer: Self-pay | Admitting: *Deleted

## 2016-08-24 ENCOUNTER — Inpatient Hospital Stay: Payer: Medicare Other | Attending: Internal Medicine | Admitting: Internal Medicine

## 2016-08-24 DIAGNOSIS — Z7982 Long term (current) use of aspirin: Secondary | ICD-10-CM | POA: Diagnosis not present

## 2016-08-24 DIAGNOSIS — N183 Chronic kidney disease, stage 3 unspecified: Secondary | ICD-10-CM | POA: Insufficient documentation

## 2016-08-24 DIAGNOSIS — I129 Hypertensive chronic kidney disease with stage 1 through stage 4 chronic kidney disease, or unspecified chronic kidney disease: Secondary | ICD-10-CM | POA: Insufficient documentation

## 2016-08-24 DIAGNOSIS — N189 Chronic kidney disease, unspecified: Principal | ICD-10-CM

## 2016-08-24 DIAGNOSIS — M48062 Spinal stenosis, lumbar region with neurogenic claudication: Secondary | ICD-10-CM | POA: Diagnosis not present

## 2016-08-24 DIAGNOSIS — D631 Anemia in chronic kidney disease: Secondary | ICD-10-CM | POA: Insufficient documentation

## 2016-08-24 DIAGNOSIS — E039 Hypothyroidism, unspecified: Secondary | ICD-10-CM | POA: Diagnosis not present

## 2016-08-24 DIAGNOSIS — Z79899 Other long term (current) drug therapy: Secondary | ICD-10-CM | POA: Insufficient documentation

## 2016-08-24 DIAGNOSIS — G4700 Insomnia, unspecified: Secondary | ICD-10-CM | POA: Insufficient documentation

## 2016-08-24 LAB — BASIC METABOLIC PANEL
ANION GAP: 9 (ref 5–15)
BUN: 22 mg/dL — ABNORMAL HIGH (ref 6–20)
CALCIUM: 9.4 mg/dL (ref 8.9–10.3)
CHLORIDE: 97 mmol/L — AB (ref 101–111)
CO2: 27 mmol/L (ref 22–32)
CREATININE: 1.15 mg/dL — AB (ref 0.44–1.00)
GFR calc non Af Amer: 42 mL/min — ABNORMAL LOW (ref >60)
GFR, EST AFRICAN AMERICAN: 49 mL/min — AB (ref >60)
Glucose, Bld: 107 mg/dL — ABNORMAL HIGH (ref 65–99)
Potassium: 5 mmol/L (ref 3.5–5.1)
SODIUM: 133 mmol/L — AB (ref 135–145)

## 2016-08-24 LAB — CBC WITH DIFFERENTIAL/PLATELET
Basophils Absolute: 0.1 10*3/uL (ref 0–0.1)
Basophils Relative: 1 %
EOS ABS: 0.1 10*3/uL (ref 0–0.7)
EOS PCT: 2 %
HCT: 36.2 % (ref 35.0–47.0)
Hemoglobin: 11.9 g/dL — ABNORMAL LOW (ref 12.0–16.0)
LYMPHS ABS: 1.4 10*3/uL (ref 1.0–3.6)
Lymphocytes Relative: 21 %
MCH: 31.4 pg (ref 26.0–34.0)
MCHC: 32.9 g/dL (ref 32.0–36.0)
MCV: 95.5 fL (ref 80.0–100.0)
MONOS PCT: 10 %
Monocytes Absolute: 0.6 10*3/uL (ref 0.2–0.9)
Neutro Abs: 4.3 10*3/uL (ref 1.4–6.5)
Neutrophils Relative %: 66 %
PLATELETS: 308 10*3/uL (ref 150–440)
RBC: 3.79 MIL/uL — ABNORMAL LOW (ref 3.80–5.20)
RDW: 12.7 % (ref 11.5–14.5)
WBC: 6.5 10*3/uL (ref 3.6–11.0)

## 2016-08-24 LAB — IRON AND TIBC
IRON: 37 ug/dL (ref 28–170)
Saturation Ratios: 15 % (ref 10.4–31.8)
TIBC: 248 ug/dL — ABNORMAL LOW (ref 250–450)
UIBC: 211 ug/dL

## 2016-08-24 LAB — FERRITIN: FERRITIN: 221 ng/mL (ref 11–307)

## 2016-08-24 NOTE — Telephone Encounter (Signed)
-----   Message from Cammie Sickle, MD sent at 08/24/2016  3:46 PM EDT ----- Please inform patient/daughter that her hemoglobin is 11.9; close to normal. No plan for iron or Aranesp at this time.

## 2016-08-24 NOTE — Telephone Encounter (Signed)
Patient made aware that no aranesp is needed. Labs are stable.

## 2016-08-24 NOTE — Progress Notes (Signed)
[place] OFFICE PROGRESS NOTE  Patient Care Team: Sherrin Daisy, MD as PCP - General (Family Medicine)   SUMMARY OF ONCOLOGIC HISTORY:  # ANEMIA- multi fact - Iron def/CKD; OCT 2016- START PROCRIT 40K q 2W; DEC 2016- procrit 20k q ; March 2017- Start Procrit 150 mcg q 4W  # CKD [Dr.Lateef]  INTERVAL HISTORY:  83 year old female patient with a history of chronic back pain and also anemia likely from iron deficiency CKD is here for follow-up.  Patent is on  Iron by mouth.   Energy levels are adequate; chronic back pain- improved on hydrocodone.  Denies any blood in stools black stools. No cough or shortness of breath. As per daughter, best winter in many years.   REVIEW OF SYSTEMS:  Patient has chronic back pain for which she gets injections. A complete 10 point review of system is done which is negative for mentioned above in history of present illness.  PAST MEDICAL HISTORY :  Past Medical History:  Diagnosis Date  . Anemia   . Hypertension   . Hypothyroidism   . Insomnia   . Lumbar radiculitis   . Lumbar spondylosis   . Lumbar stenosis with neurogenic claudication   . Stage III chronic kidney disease     PAST SURGICAL HISTORY :   Past Surgical History:  Procedure Laterality Date  . ABDOMINAL HYSTERECTOMY    . APPENDECTOMY    . TOTAL KNEE ARTHROPLASTY Left 2002    FAMILY HISTORY :   Family History  Problem Relation Age of Onset  . Stroke Mother   . CAD Father   . Stomach cancer Sister   . Breast cancer Sister   . Breast cancer Sister   . Breast cancer Daughter     SOCIAL HISTORY:   Social History  Substance Use Topics  . Smoking status: Never Smoker  . Smokeless tobacco: Never Used  . Alcohol use No    ALLERGIES:  is allergic to celecoxib and clarithromycin.  MEDICATIONS:  Current Outpatient Prescriptions  Medication Sig Dispense Refill  . acetaminophen (TYLENOL) 325 MG tablet Take 650 mg by mouth daily after lunch.    . ALPRAZolam  (XANAX) 0.25 MG tablet TAKE (1) TABLET BY MOUTH TWICE DAILY AS NEEDED FOR SLEEP/ANXIETY    . aspirin EC 81 MG tablet Take 81 mg by mouth daily.     . Cetirizine HCl 10 MG CAPS Take 10 mg by mouth once.     . Cyanocobalamin 2500 MCG SUBL Place 1 tablet under the tongue daily.     . ferrous sulfate 325 (65 FE) MG EC tablet Take 325 mg by mouth 2 (two) times daily.     Marland Kitchen HYDROcodone-acetaminophen (NORCO/VICODIN) 5-325 MG per tablet 1/2-1 po tid prn Earliest Fill Date: 11/07/14    . levothyroxine (SYNTHROID, LEVOTHROID) 75 MCG tablet Take 75 mcg by mouth daily before breakfast.    . lisinopril (PRINIVIL,ZESTRIL) 5 MG tablet Take 5 mg by mouth daily.     . Multiple Vitamin (MULTI-VITAMINS) TABS Take 1 tablet by mouth daily.     . sodium bicarbonate 650 MG tablet Take 650 mg by mouth 2 (two) times daily.     . traZODone (DESYREL) 50 MG tablet Take 50 mg by mouth at bedtime as needed for sleep.      No current facility-administered medications for this visit.     PHYSICAL EXAMINATION: ECOG PERFORMANCE STATUS: 1 - Symptomatic but completely ambulatory  BP (!) 154/82 (BP Location: Left Arm,  Patient Position: Sitting)   Pulse 68   Temp 98.2 F (36.8 C) (Tympanic)   Resp 16   Ht 5' (1.524 m)   Wt 107 lb 9.4 oz (48.8 kg)   BMI 21.01 kg/m   Filed Weights   08/24/16 1450  Weight: 107 lb 9.4 oz (48.8 kg)    GENERAL: Thin built cachectic. Alert, no distress and comfortable.   Accompanied by daughter. Walking with a cane.  EYES: No pallor. OROPHARYNX: no thrush or ulceration; poor dentition NECK: supple, no masses felt LYMPH:  no palpable lymphadenopathy in the cervical, axillary or inguinal regions LUNGS: clear to auscultation and  No wheeze or crackles HEART/CVS: regular rate & rhythm and no murmurs; No lower extremity edema ABDOMEN:abdomen soft, non-tender and normal bowel sounds Musculoskeletal:no cyanosis of digits and no clubbing  PSYCH: alert & oriented x 3 with fluent speech NEURO:  no focal motor/sensory deficits SKIN:  no rashes or significant lesions  LABORATORY DATA:  I have reviewed the data as listed    Component Value Date/Time   NA 133 (L) 08/24/2016 1417   NA 139 05/31/2011 1500   K 5.0 08/24/2016 1417   K 5.2 (H) 05/31/2011 1500   CL 97 (L) 08/24/2016 1417   CL 101 05/31/2011 1500   CO2 27 08/24/2016 1417   CO2 27 05/31/2011 1500   GLUCOSE 107 (H) 08/24/2016 1417   GLUCOSE 94 05/31/2011 1500   BUN 22 (H) 08/24/2016 1417   BUN 33 (H) 05/31/2011 1500   CREATININE 1.15 (H) 08/24/2016 1417   CREATININE 2.06 (H) 05/31/2011 1500   CALCIUM 9.4 08/24/2016 1417   CALCIUM 9.4 05/31/2011 1500   GFRNONAA 42 (L) 08/24/2016 1417   GFRNONAA 25 (L) 05/31/2011 1500   GFRAA 49 (L) 08/24/2016 1417   GFRAA 30 (L) 05/31/2011 1500    No results found for: SPEP, UPEP  Lab Results  Component Value Date   WBC 6.5 08/24/2016   NEUTROABS 4.3 08/24/2016   HGB 11.9 (L) 08/24/2016   HCT 36.2 08/24/2016   MCV 95.5 08/24/2016   PLT 308 08/24/2016      Chemistry      Component Value Date/Time   NA 133 (L) 08/24/2016 1417   NA 139 05/31/2011 1500   K 5.0 08/24/2016 1417   K 5.2 (H) 05/31/2011 1500   CL 97 (L) 08/24/2016 1417   CL 101 05/31/2011 1500   CO2 27 08/24/2016 1417   CO2 27 05/31/2011 1500   BUN 22 (H) 08/24/2016 1417   BUN 33 (H) 05/31/2011 1500   CREATININE 1.15 (H) 08/24/2016 1417   CREATININE 2.06 (H) 05/31/2011 1500      Component Value Date/Time   CALCIUM 9.4 08/24/2016 1417   CALCIUM 9.4 05/31/2011 1500       ASSESSMENT & PLAN:  Anemia due to stage 3 chronic kidney disease #  Anemia- secondary to CKD-stage III/iron deficiency. Status post 2 Procrit injections/2 IV iron infusion [October/November 2016];   # Interestingly patient has not needed any more treatments since then.  Patient's Hemoglobin today is 11.9. Iron studies pending. No Aranesp today. Patient continues to be PO iron; no issues tolerating it.   # Check cbc/cmp every 3  months;  possible Aranesp; follow-up with me in 6 months with CBC and BMP iron studies. Patient daughter agreed with the plan.    Cammie Sickle, MD 08/24/2016 3:47 PM

## 2016-08-24 NOTE — Assessment & Plan Note (Addendum)
#    Anemia- secondary to CKD-stage III/iron deficiency. Status post 2 Procrit injections/2 IV iron infusion [October/November 2016];   # Interestingly patient has not needed any more treatments since then.  Patient's Hemoglobin today is 11.9. Iron studies pending. No Aranesp today. Patient continues to be PO iron; no issues tolerating it.   # Check cbc/cmp every 3 months;  possible Aranesp; follow-up with me in 6 months with CBC and BMP iron studies. Patient daughter agreed with the plan.

## 2016-11-29 ENCOUNTER — Inpatient Hospital Stay: Payer: Medicare Other

## 2016-11-29 ENCOUNTER — Inpatient Hospital Stay: Payer: Medicare Other | Attending: Internal Medicine

## 2016-11-29 ENCOUNTER — Other Ambulatory Visit: Payer: Medicare Other

## 2016-11-29 ENCOUNTER — Ambulatory Visit: Payer: Medicare Other

## 2016-11-29 DIAGNOSIS — N183 Chronic kidney disease, stage 3 unspecified: Secondary | ICD-10-CM

## 2016-11-29 DIAGNOSIS — I129 Hypertensive chronic kidney disease with stage 1 through stage 4 chronic kidney disease, or unspecified chronic kidney disease: Secondary | ICD-10-CM | POA: Insufficient documentation

## 2016-11-29 DIAGNOSIS — D631 Anemia in chronic kidney disease: Secondary | ICD-10-CM | POA: Insufficient documentation

## 2016-11-29 LAB — CBC WITH DIFFERENTIAL/PLATELET
BASOS ABS: 0 10*3/uL (ref 0–0.1)
BASOS PCT: 1 %
EOS ABS: 0.1 10*3/uL (ref 0–0.7)
Eosinophils Relative: 2 %
HEMATOCRIT: 35.3 % (ref 35.0–47.0)
Hemoglobin: 11.8 g/dL — ABNORMAL LOW (ref 12.0–16.0)
Lymphocytes Relative: 16 %
Lymphs Abs: 0.9 10*3/uL — ABNORMAL LOW (ref 1.0–3.6)
MCH: 32 pg (ref 26.0–34.0)
MCHC: 33.5 g/dL (ref 32.0–36.0)
MCV: 95.3 fL (ref 80.0–100.0)
MONO ABS: 0.6 10*3/uL (ref 0.2–0.9)
MONOS PCT: 9 %
NEUTROS ABS: 4.2 10*3/uL (ref 1.4–6.5)
Neutrophils Relative %: 72 %
PLATELETS: 325 10*3/uL (ref 150–440)
RBC: 3.7 MIL/uL — ABNORMAL LOW (ref 3.80–5.20)
RDW: 12.7 % (ref 11.5–14.5)
WBC: 5.9 10*3/uL (ref 3.6–11.0)

## 2016-11-29 LAB — BASIC METABOLIC PANEL
ANION GAP: 8 (ref 5–15)
BUN: 21 mg/dL — ABNORMAL HIGH (ref 6–20)
CALCIUM: 9.4 mg/dL (ref 8.9–10.3)
CO2: 28 mmol/L (ref 22–32)
CREATININE: 1.22 mg/dL — AB (ref 0.44–1.00)
Chloride: 98 mmol/L — ABNORMAL LOW (ref 101–111)
GFR calc Af Amer: 45 mL/min — ABNORMAL LOW (ref >60)
GFR, EST NON AFRICAN AMERICAN: 39 mL/min — AB (ref >60)
GLUCOSE: 118 mg/dL — AB (ref 65–99)
Potassium: 4.5 mmol/L (ref 3.5–5.1)
Sodium: 134 mmol/L — ABNORMAL LOW (ref 135–145)

## 2017-02-22 ENCOUNTER — Inpatient Hospital Stay: Payer: Medicare Other | Attending: Internal Medicine | Admitting: Internal Medicine

## 2017-02-22 ENCOUNTER — Inpatient Hospital Stay: Payer: Medicare Other

## 2017-02-22 ENCOUNTER — Other Ambulatory Visit: Payer: Self-pay

## 2017-02-22 VITALS — BP 159/71 | HR 87 | Temp 97.4°F | Ht 60.0 in | Wt 110.0 lb

## 2017-02-22 DIAGNOSIS — N183 Chronic kidney disease, stage 3 unspecified: Secondary | ICD-10-CM

## 2017-02-22 DIAGNOSIS — G4700 Insomnia, unspecified: Secondary | ICD-10-CM | POA: Diagnosis not present

## 2017-02-22 DIAGNOSIS — I129 Hypertensive chronic kidney disease with stage 1 through stage 4 chronic kidney disease, or unspecified chronic kidney disease: Secondary | ICD-10-CM

## 2017-02-22 DIAGNOSIS — M48062 Spinal stenosis, lumbar region with neurogenic claudication: Secondary | ICD-10-CM | POA: Diagnosis not present

## 2017-02-22 DIAGNOSIS — D631 Anemia in chronic kidney disease: Secondary | ICD-10-CM | POA: Insufficient documentation

## 2017-02-22 DIAGNOSIS — E039 Hypothyroidism, unspecified: Secondary | ICD-10-CM | POA: Diagnosis not present

## 2017-02-22 DIAGNOSIS — Z79899 Other long term (current) drug therapy: Secondary | ICD-10-CM | POA: Insufficient documentation

## 2017-02-22 DIAGNOSIS — Z7982 Long term (current) use of aspirin: Secondary | ICD-10-CM | POA: Diagnosis not present

## 2017-02-22 LAB — CBC WITH DIFFERENTIAL/PLATELET
BASOS ABS: 0.1 10*3/uL (ref 0–0.1)
Basophils Relative: 1 %
EOS ABS: 0.2 10*3/uL (ref 0–0.7)
EOS PCT: 3 %
HCT: 36.9 % (ref 35.0–47.0)
HEMOGLOBIN: 12.1 g/dL (ref 12.0–16.0)
LYMPHS ABS: 1 10*3/uL (ref 1.0–3.6)
LYMPHS PCT: 15 %
MCH: 30.8 pg (ref 26.0–34.0)
MCHC: 32.9 g/dL (ref 32.0–36.0)
MCV: 93.6 fL (ref 80.0–100.0)
Monocytes Absolute: 0.5 10*3/uL (ref 0.2–0.9)
Monocytes Relative: 8 %
NEUTROS PCT: 73 %
Neutro Abs: 4.8 10*3/uL (ref 1.4–6.5)
PLATELETS: 395 10*3/uL (ref 150–440)
RBC: 3.94 MIL/uL (ref 3.80–5.20)
RDW: 12.8 % (ref 11.5–14.5)
WBC: 6.6 10*3/uL (ref 3.6–11.0)

## 2017-02-22 LAB — BASIC METABOLIC PANEL
Anion gap: 10 (ref 5–15)
BUN: 25 mg/dL — ABNORMAL HIGH (ref 6–20)
CALCIUM: 9.1 mg/dL (ref 8.9–10.3)
CO2: 25 mmol/L (ref 22–32)
CREATININE: 1.28 mg/dL — AB (ref 0.44–1.00)
Chloride: 99 mmol/L — ABNORMAL LOW (ref 101–111)
GFR, EST AFRICAN AMERICAN: 43 mL/min — AB (ref >60)
GFR, EST NON AFRICAN AMERICAN: 37 mL/min — AB (ref >60)
Glucose, Bld: 107 mg/dL — ABNORMAL HIGH (ref 65–99)
Potassium: 4 mmol/L (ref 3.5–5.1)
SODIUM: 134 mmol/L — AB (ref 135–145)

## 2017-02-22 NOTE — Progress Notes (Signed)
[place] OFFICE PROGRESS NOTE  Patient Care Team: Sherrin Daisy, MD as PCP - General (Family Medicine)   SUMMARY OF ONCOLOGIC HISTORY:  # ANEMIA- multi fact - Iron def/CKD; OCT 2016- START PROCRIT 40K q 2W; DEC 2016- procrit 20k q ; March 2017- Start Procrit 150 mcg q 4W  # CKD [Dr.Lateef]  INTERVAL HISTORY:  83 year old female patient with a history of chronic back pain and also anemia likely from iron deficiency CKD is here for follow-up.  Patent is on  Iron by mouth.  Patient is chronic back pain. Slightly worse recently. She is currently status post steroid injection. She continues to be on by mouth iron. No constipation or dyspepsia. No cough or shortness of breath.   REVIEW OF SYSTEMS:  Patient has chronic back pain for which she gets injections. A complete 10 point review of system is done which is negative for mentioned above in history of present illness.  PAST MEDICAL HISTORY :  Past Medical History:  Diagnosis Date  . Anemia   . Hypertension   . Hypothyroidism   . Insomnia   . Lumbar radiculitis   . Lumbar spondylosis   . Lumbar stenosis with neurogenic claudication   . Stage III chronic kidney disease     PAST SURGICAL HISTORY :   Past Surgical History:  Procedure Laterality Date  . ABDOMINAL HYSTERECTOMY    . APPENDECTOMY    . TOTAL KNEE ARTHROPLASTY Left 2002    FAMILY HISTORY :   Family History  Problem Relation Age of Onset  . Stroke Mother   . CAD Father   . Stomach cancer Sister   . Breast cancer Sister   . Breast cancer Sister   . Breast cancer Daughter     SOCIAL HISTORY:   Social History  Substance Use Topics  . Smoking status: Never Smoker  . Smokeless tobacco: Never Used  . Alcohol use No    ALLERGIES:  is allergic to celecoxib and clarithromycin.  MEDICATIONS:  Current Outpatient Prescriptions  Medication Sig Dispense Refill  . ALPRAZolam (XANAX) 0.25 MG tablet TAKE (1) TABLET BY MOUTH TWICE DAILY AS NEEDED  FOR SLEEP/ANXIETY    . aspirin EC 81 MG tablet Take 81 mg by mouth daily.     . Cetirizine HCl 10 MG CAPS Take 10 mg by mouth once.     . Cyanocobalamin 2500 MCG SUBL Place 1 tablet under the tongue daily.     . ferrous sulfate 325 (65 FE) MG EC tablet Take 325 mg by mouth 2 (two) times daily.     Marland Kitchen HYDROcodone-acetaminophen (NORCO/VICODIN) 5-325 MG per tablet 1 tablet twice daily    . levothyroxine (SYNTHROID, LEVOTHROID) 75 MCG tablet Take 75 mcg by mouth daily before breakfast.    . lisinopril (PRINIVIL,ZESTRIL) 5 MG tablet Take 5 mg by mouth daily.     . Multiple Vitamin (MULTI-VITAMINS) TABS Take 1 tablet by mouth daily.     . sodium bicarbonate 650 MG tablet Take 650 mg by mouth 2 (two) times daily.     . traZODone (DESYREL) 50 MG tablet Take 50 mg by mouth at bedtime as needed for sleep.     Marland Kitchen acetaminophen (TYLENOL) 325 MG tablet Take 650 mg by mouth daily after lunch.     No current facility-administered medications for this visit.     PHYSICAL EXAMINATION: ECOG PERFORMANCE STATUS: 1 - Symptomatic but completely ambulatory  BP (!) 159/71 (BP Location: Left Arm, Patient Position: Sitting)  Pulse 87   Temp (!) 97.4 F (36.3 C) (Tympanic)   Ht 5' (1.524 m)   Wt 110 lb (49.9 kg)   BMI 21.48 kg/m   Filed Weights   02/22/17 0908  Weight: 110 lb (49.9 kg)    GENERAL: Thin built cachectic. Alert, no distress and comfortable.   Accompanied by daughter. Walking with a cane.  EYES: No pallor. OROPHARYNX: no thrush or ulceration; poor dentition NECK: supple, no masses felt LYMPH:  no palpable lymphadenopathy in the cervical, axillary or inguinal regions LUNGS: clear to auscultation and  No wheeze or crackles HEART/CVS: regular rate & rhythm and no murmurs; No lower extremity edema ABDOMEN:abdomen soft, non-tender and normal bowel sounds Musculoskeletal:no cyanosis of digits and no clubbing  PSYCH: alert & oriented x 3 with fluent speech NEURO: no focal motor/sensory  deficits SKIN:  no rashes or significant lesions  LABORATORY DATA:  I have reviewed the data as listed    Component Value Date/Time   NA 134 (L) 02/22/2017 0848   NA 139 05/31/2011 1500   K 4.0 02/22/2017 0848   K 5.2 (H) 05/31/2011 1500   CL 99 (L) 02/22/2017 0848   CL 101 05/31/2011 1500   CO2 25 02/22/2017 0848   CO2 27 05/31/2011 1500   GLUCOSE 107 (H) 02/22/2017 0848   GLUCOSE 94 05/31/2011 1500   BUN 25 (H) 02/22/2017 0848   BUN 33 (H) 05/31/2011 1500   CREATININE 1.28 (H) 02/22/2017 0848   CREATININE 2.06 (H) 05/31/2011 1500   CALCIUM 9.1 02/22/2017 0848   CALCIUM 9.4 05/31/2011 1500   GFRNONAA 37 (L) 02/22/2017 0848   GFRNONAA 25 (L) 05/31/2011 1500   GFRAA 43 (L) 02/22/2017 0848   GFRAA 30 (L) 05/31/2011 1500    No results found for: SPEP, UPEP  Lab Results  Component Value Date   WBC 6.6 02/22/2017   NEUTROABS 4.8 02/22/2017   HGB 12.1 02/22/2017   HCT 36.9 02/22/2017   MCV 93.6 02/22/2017   PLT 395 02/22/2017      Chemistry      Component Value Date/Time   NA 134 (L) 02/22/2017 0848   NA 139 05/31/2011 1500   K 4.0 02/22/2017 0848   K 5.2 (H) 05/31/2011 1500   CL 99 (L) 02/22/2017 0848   CL 101 05/31/2011 1500   CO2 25 02/22/2017 0848   CO2 27 05/31/2011 1500   BUN 25 (H) 02/22/2017 0848   BUN 33 (H) 05/31/2011 1500   CREATININE 1.28 (H) 02/22/2017 0848   CREATININE 2.06 (H) 05/31/2011 1500      Component Value Date/Time   CALCIUM 9.1 02/22/2017 0848   CALCIUM 9.4 05/31/2011 1500       ASSESSMENT & PLAN:  Anemia due to stage 3 chronic kidney disease #  Anemia- secondary to CKD-stage III/iron deficiency. Status post 2 Procrit injections/2 IV iron infusion [October/November 2016];   # Interestingly patient has not needed any more treatments since then.  Patient's Hemoglobin today is 12.1. No Aranesp today/IV iron today. Patient continues to be PO iron; no issues tolerating it.   # follow-up with me in 6 months with CBC and BMP iron  studies. Patient daughter agreed with the plan; Possible Venofer.   Cc:     Cammie Sickle, MD 02/22/2017 9:38 AM

## 2017-02-22 NOTE — Assessment & Plan Note (Addendum)
#    Anemia- secondary to CKD-stage III/iron deficiency. Status post 2 Procrit injections/2 IV iron infusion [October/November 2016];   # Interestingly patient has not needed any more treatments since then.  Patient's Hemoglobin today is 12.1. No Aranesp today/IV iron today. Patient continues to be PO iron; no issues tolerating it.   # follow-up with me in 6 months with CBC and BMP iron studies. Patient daughter agreed with the plan; Possible Venofer.   Cc:

## 2017-04-15 ENCOUNTER — Other Ambulatory Visit: Payer: Self-pay | Admitting: Nurse Practitioner

## 2017-08-23 ENCOUNTER — Other Ambulatory Visit: Payer: Self-pay

## 2017-08-23 ENCOUNTER — Inpatient Hospital Stay: Payer: Medicare Other

## 2017-08-23 ENCOUNTER — Inpatient Hospital Stay: Payer: Medicare Other | Attending: Internal Medicine | Admitting: Internal Medicine

## 2017-08-23 VITALS — BP 130/70 | HR 71 | Temp 98.1°F | Resp 18 | Ht 60.0 in | Wt 110.0 lb

## 2017-08-23 DIAGNOSIS — M48062 Spinal stenosis, lumbar region with neurogenic claudication: Secondary | ICD-10-CM | POA: Insufficient documentation

## 2017-08-23 DIAGNOSIS — D631 Anemia in chronic kidney disease: Secondary | ICD-10-CM

## 2017-08-23 DIAGNOSIS — G4700 Insomnia, unspecified: Secondary | ICD-10-CM | POA: Diagnosis not present

## 2017-08-23 DIAGNOSIS — Z7982 Long term (current) use of aspirin: Secondary | ICD-10-CM | POA: Diagnosis not present

## 2017-08-23 DIAGNOSIS — N183 Chronic kidney disease, stage 3 unspecified: Secondary | ICD-10-CM

## 2017-08-23 DIAGNOSIS — E039 Hypothyroidism, unspecified: Secondary | ICD-10-CM | POA: Insufficient documentation

## 2017-08-23 DIAGNOSIS — Z79899 Other long term (current) drug therapy: Secondary | ICD-10-CM | POA: Insufficient documentation

## 2017-08-23 DIAGNOSIS — I129 Hypertensive chronic kidney disease with stage 1 through stage 4 chronic kidney disease, or unspecified chronic kidney disease: Secondary | ICD-10-CM | POA: Diagnosis not present

## 2017-08-23 LAB — BASIC METABOLIC PANEL
ANION GAP: 10 (ref 5–15)
BUN: 20 mg/dL (ref 6–20)
CALCIUM: 8.8 mg/dL — AB (ref 8.9–10.3)
CO2: 23 mmol/L (ref 22–32)
CREATININE: 1.06 mg/dL — AB (ref 0.44–1.00)
Chloride: 97 mmol/L — ABNORMAL LOW (ref 101–111)
GFR calc non Af Amer: 46 mL/min — ABNORMAL LOW (ref >60)
GFR, EST AFRICAN AMERICAN: 54 mL/min — AB (ref >60)
Glucose, Bld: 112 mg/dL — ABNORMAL HIGH (ref 65–99)
Potassium: 4.3 mmol/L (ref 3.5–5.1)
Sodium: 130 mmol/L — ABNORMAL LOW (ref 135–145)

## 2017-08-23 LAB — CBC WITH DIFFERENTIAL/PLATELET
BASOS ABS: 0.1 10*3/uL (ref 0–0.1)
BASOS PCT: 1 %
EOS ABS: 0.1 10*3/uL (ref 0–0.7)
Eosinophils Relative: 2 %
HCT: 31.4 % — ABNORMAL LOW (ref 35.0–47.0)
HEMOGLOBIN: 10.7 g/dL — AB (ref 12.0–16.0)
Lymphocytes Relative: 18 %
Lymphs Abs: 1.1 10*3/uL (ref 1.0–3.6)
MCH: 31.6 pg (ref 26.0–34.0)
MCHC: 34.1 g/dL (ref 32.0–36.0)
MCV: 92.8 fL (ref 80.0–100.0)
Monocytes Absolute: 0.7 10*3/uL (ref 0.2–0.9)
Monocytes Relative: 10 %
NEUTROS PCT: 69 %
Neutro Abs: 4.4 10*3/uL (ref 1.4–6.5)
Platelets: 334 10*3/uL (ref 150–440)
RBC: 3.38 MIL/uL — AB (ref 3.80–5.20)
RDW: 12.5 % (ref 11.5–14.5)
WBC: 6.4 10*3/uL (ref 3.6–11.0)

## 2017-08-23 LAB — IRON AND TIBC
Iron: 37 ug/dL (ref 28–170)
SATURATION RATIOS: 15 % (ref 10.4–31.8)
TIBC: 241 ug/dL — ABNORMAL LOW (ref 250–450)
UIBC: 204 ug/dL

## 2017-08-23 LAB — FERRITIN: Ferritin: 126 ng/mL (ref 11–307)

## 2017-08-23 NOTE — Assessment & Plan Note (Addendum)
#    Anemia- secondary to CKD-stage III/iron deficiency. Status post 2 Procrit injections/2 IV iron infusion [October/November 2016]; Interestingly patient has not needed any more treatments since then.   #  Patient's Hemoglobin today is 10.7 No Aranesp today.  Saturation approximately year ago was 15%-iron studies from today pending.  Patient recommend with IV iron; recommend continued PO iron; no issues tolerating it.   # follow-up with me in 6 months with CBC and BMP iron studies. Patient daughter agreed with the plan; Possible Venofer.   AS:UORVIF.

## 2017-08-23 NOTE — Progress Notes (Signed)
[place] OFFICE PROGRESS NOTE  Patient Care Team: Sherrin Daisy, MD as PCP - General (Family Medicine)   SUMMARY OF ONCOLOGIC HISTORY:  # ANEMIA- multi fact - Iron def/CKD; OCT 2016- START PROCRIT 40K q 2W; DEC 2016- procrit 20k q ; March 2017- Start Procrit 150 mcg q 4W  # CKD [Dr.Lateef]  INTERVAL HISTORY:  84 year old female patient with a history of chronic back pain and also anemia likely from iron deficiency CKD is here for follow-up.  Patent is on  Iron by mouth.  Patient complains of mild fatigue; denies any significant fatigue.  Denies any weight loss.  Denies any worsening back pain.  She continues to be on p.o. iron without any significant problems.  No unusual shortness of breath or cough.  REVIEW OF SYSTEMS:  Patient has chronic back pain for which she gets injections. A complete 10 point review of system is done which is negative for mentioned above in history of present illness.  PAST MEDICAL HISTORY :  Past Medical History:  Diagnosis Date  . Anemia   . Hypertension   . Hypothyroidism   . Insomnia   . Lumbar radiculitis   . Lumbar spondylosis   . Lumbar stenosis with neurogenic claudication   . Stage III chronic kidney disease     PAST SURGICAL HISTORY :   Past Surgical History:  Procedure Laterality Date  . ABDOMINAL HYSTERECTOMY    . APPENDECTOMY    . TOTAL KNEE ARTHROPLASTY Left 2002    FAMILY HISTORY :   Family History  Problem Relation Age of Onset  . Stroke Mother   . CAD Father   . Stomach cancer Sister   . Breast cancer Sister   . Breast cancer Sister   . Breast cancer Daughter     SOCIAL HISTORY:   Social History   Tobacco Use  . Smoking status: Never Smoker  . Smokeless tobacco: Never Used  Substance Use Topics  . Alcohol use: No    Alcohol/week: 0.0 oz  . Drug use: No    ALLERGIES:  is allergic to celecoxib and clarithromycin.  MEDICATIONS:  Current Outpatient Medications  Medication Sig Dispense Refill   . acetaminophen (TYLENOL) 325 MG tablet Take 650 mg by mouth daily after lunch.    . ALPRAZolam (XANAX) 0.25 MG tablet TAKE (1) TABLET BY MOUTH TWICE DAILY AS NEEDED FOR SLEEP/ANXIETY    . aspirin EC 81 MG tablet Take 81 mg by mouth daily.     . Cetirizine HCl 10 MG CAPS Take 10 mg by mouth once.     . Cyanocobalamin 2500 MCG SUBL Place 1 tablet under the tongue daily.     . ferrous sulfate 325 (65 FE) MG EC tablet Take 325 mg by mouth 2 (two) times daily.     Marland Kitchen HYDROcodone-acetaminophen (NORCO/VICODIN) 5-325 MG per tablet 1 tablet twice daily    . levothyroxine (SYNTHROID, LEVOTHROID) 75 MCG tablet Take 75 mcg by mouth daily before breakfast.    . lisinopril (PRINIVIL,ZESTRIL) 5 MG tablet Take 5 mg by mouth daily.     . Multiple Vitamin (MULTI-VITAMINS) TABS Take 1 tablet by mouth daily.     . sodium bicarbonate 650 MG tablet Take 650 mg by mouth 2 (two) times daily.     . traZODone (DESYREL) 50 MG tablet Take 50 mg by mouth at bedtime as needed for sleep.      No current facility-administered medications for this visit.     PHYSICAL EXAMINATION:  ECOG PERFORMANCE STATUS: 1 - Symptomatic but completely ambulatory  BP 130/70   Pulse 71   Temp 98.1 F (36.7 C) (Tympanic)   Resp 18   Ht 5' (1.524 m)   Wt 110 lb (49.9 kg)   BMI 21.48 kg/m   Filed Weights   08/23/17 1104  Weight: 110 lb (49.9 kg)    GENERAL: Thin built cachectic. Alert, no distress and comfortable.   Accompanied by daughter. Walking with a cane.  EYES: No pallor. OROPHARYNX: no thrush or ulceration; poor dentition NECK: supple, no masses felt LYMPH:  no palpable lymphadenopathy in the cervical, axillary or inguinal regions LUNGS: clear to auscultation and  No wheeze or crackles HEART/CVS: regular rate & rhythm and no murmurs; No lower extremity edema ABDOMEN:abdomen soft, non-tender and normal bowel sounds Musculoskeletal:no cyanosis of digits and no clubbing  PSYCH: alert & oriented x 3 with fluent  speech NEURO: no focal motor/sensory deficits SKIN:  no rashes or significant lesions  LABORATORY DATA:  I have reviewed the data as listed    Component Value Date/Time   NA 130 (L) 08/23/2017 1022   NA 139 05/31/2011 1500   K 4.3 08/23/2017 1022   K 5.2 (H) 05/31/2011 1500   CL 97 (L) 08/23/2017 1022   CL 101 05/31/2011 1500   CO2 23 08/23/2017 1022   CO2 27 05/31/2011 1500   GLUCOSE 112 (H) 08/23/2017 1022   GLUCOSE 94 05/31/2011 1500   BUN 20 08/23/2017 1022   BUN 33 (H) 05/31/2011 1500   CREATININE 1.06 (H) 08/23/2017 1022   CREATININE 2.06 (H) 05/31/2011 1500   CALCIUM 8.8 (L) 08/23/2017 1022   CALCIUM 9.4 05/31/2011 1500   GFRNONAA 46 (L) 08/23/2017 1022   GFRNONAA 25 (L) 05/31/2011 1500   GFRAA 54 (L) 08/23/2017 1022   GFRAA 30 (L) 05/31/2011 1500    No results found for: SPEP, UPEP  Lab Results  Component Value Date   WBC 6.4 08/23/2017   NEUTROABS 4.4 08/23/2017   HGB 10.7 (L) 08/23/2017   HCT 31.4 (L) 08/23/2017   MCV 92.8 08/23/2017   PLT 334 08/23/2017      Chemistry      Component Value Date/Time   NA 130 (L) 08/23/2017 1022   NA 139 05/31/2011 1500   K 4.3 08/23/2017 1022   K 5.2 (H) 05/31/2011 1500   CL 97 (L) 08/23/2017 1022   CL 101 05/31/2011 1500   CO2 23 08/23/2017 1022   CO2 27 05/31/2011 1500   BUN 20 08/23/2017 1022   BUN 33 (H) 05/31/2011 1500   CREATININE 1.06 (H) 08/23/2017 1022   CREATININE 2.06 (H) 05/31/2011 1500      Component Value Date/Time   CALCIUM 8.8 (L) 08/23/2017 1022   CALCIUM 9.4 05/31/2011 1500       ASSESSMENT & PLAN:  Anemia due to stage 3 chronic kidney disease #  Anemia- secondary to CKD-stage III/iron deficiency. Status post 2 Procrit injections/2 IV iron infusion [October/November 2016]; # Interestingly patient has not needed any more treatments since then.   #  Patient's Hemoglobin today is 10.7 No Aranesp today.. reluctant Patient continues to be PO iron; no issues tolerating it.   # follow-up with  me in 6 months with CBC and BMP iron studies. Patient daughter agreed with the plan; Possible Venofer.   Cc:     Cammie Sickle, MD 08/23/2017 1:19 PM

## 2018-02-21 ENCOUNTER — Inpatient Hospital Stay: Payer: Medicare Other | Attending: Internal Medicine | Admitting: Internal Medicine

## 2018-02-21 ENCOUNTER — Inpatient Hospital Stay: Payer: Medicare Other

## 2018-02-21 ENCOUNTER — Other Ambulatory Visit: Payer: Self-pay

## 2018-02-21 ENCOUNTER — Encounter: Payer: Self-pay | Admitting: Internal Medicine

## 2018-02-21 VITALS — BP 153/76 | HR 68 | Temp 95.0°F | Resp 16 | Ht 60.0 in | Wt 110.0 lb

## 2018-02-21 DIAGNOSIS — Z79899 Other long term (current) drug therapy: Secondary | ICD-10-CM | POA: Diagnosis not present

## 2018-02-21 DIAGNOSIS — D631 Anemia in chronic kidney disease: Secondary | ICD-10-CM | POA: Insufficient documentation

## 2018-02-21 DIAGNOSIS — G8929 Other chronic pain: Secondary | ICD-10-CM | POA: Diagnosis not present

## 2018-02-21 DIAGNOSIS — I10 Essential (primary) hypertension: Secondary | ICD-10-CM | POA: Diagnosis not present

## 2018-02-21 DIAGNOSIS — N183 Chronic kidney disease, stage 3 unspecified: Secondary | ICD-10-CM

## 2018-02-21 DIAGNOSIS — Z7982 Long term (current) use of aspirin: Secondary | ICD-10-CM | POA: Insufficient documentation

## 2018-02-21 LAB — CBC WITH DIFFERENTIAL/PLATELET
Abs Immature Granulocytes: 0.01 10*3/uL (ref 0.00–0.07)
BASOS PCT: 1 %
Basophils Absolute: 0.1 10*3/uL (ref 0.0–0.1)
EOS PCT: 2 %
Eosinophils Absolute: 0.1 10*3/uL (ref 0.0–0.5)
HCT: 34.5 % — ABNORMAL LOW (ref 36.0–46.0)
Hemoglobin: 11.2 g/dL — ABNORMAL LOW (ref 12.0–15.0)
Immature Granulocytes: 0 %
Lymphocytes Relative: 22 %
Lymphs Abs: 1.2 10*3/uL (ref 0.7–4.0)
MCH: 30.1 pg (ref 26.0–34.0)
MCHC: 32.5 g/dL (ref 30.0–36.0)
MCV: 92.7 fL (ref 80.0–100.0)
MONO ABS: 0.6 10*3/uL (ref 0.1–1.0)
Monocytes Relative: 10 %
NEUTROS ABS: 3.6 10*3/uL (ref 1.7–7.7)
Neutrophils Relative %: 65 %
PLATELETS: 312 10*3/uL (ref 150–400)
RBC: 3.72 MIL/uL — AB (ref 3.87–5.11)
RDW: 12.7 % (ref 11.5–15.5)
WBC: 5.5 10*3/uL (ref 4.0–10.5)
nRBC: 0 % (ref 0.0–0.2)

## 2018-02-21 LAB — COMPREHENSIVE METABOLIC PANEL
ALT: 10 U/L (ref 0–44)
AST: 25 U/L (ref 15–41)
Albumin: 4 g/dL (ref 3.5–5.0)
Alkaline Phosphatase: 76 U/L (ref 38–126)
Anion gap: 12 (ref 5–15)
BUN: 25 mg/dL — AB (ref 8–23)
CHLORIDE: 100 mmol/L (ref 98–111)
CO2: 24 mmol/L (ref 22–32)
Calcium: 9.3 mg/dL (ref 8.9–10.3)
Creatinine, Ser: 1.19 mg/dL — ABNORMAL HIGH (ref 0.44–1.00)
GFR, EST AFRICAN AMERICAN: 46 mL/min — AB (ref >60)
GFR, EST NON AFRICAN AMERICAN: 40 mL/min — AB (ref >60)
Glucose, Bld: 91 mg/dL (ref 70–99)
POTASSIUM: 4.3 mmol/L (ref 3.5–5.1)
Sodium: 136 mmol/L (ref 135–145)
TOTAL PROTEIN: 7.5 g/dL (ref 6.5–8.1)
Total Bilirubin: 0.5 mg/dL (ref 0.3–1.2)

## 2018-02-21 LAB — IRON AND TIBC
IRON: 60 ug/dL (ref 28–170)
SATURATION RATIOS: 23 % (ref 10.4–31.8)
TIBC: 259 ug/dL (ref 250–450)
UIBC: 199 ug/dL

## 2018-02-21 LAB — FERRITIN: Ferritin: 80 ng/mL (ref 11–307)

## 2018-02-21 NOTE — Assessment & Plan Note (Addendum)
#    Anemia- secondary to CKD-stage III/iron deficiency. Status post 2 Procrit injections/2 IV iron infusion [October/November 2016]; Interestingly patient has not needed any more treatments since then.   #  Patient's Hemoglobin today is 11.2.  No Aranesp today.  Continue p.o. iron.  Discussed regarding IV iron [patient reluctant/daughter preferred].  Await on the iron numbers from today.  Will inform patient.  # DISPOSITION:  # NO Venofer today. # follow-up with me in 6 months- MDPossible Venofer/labs-CBC/BMP iron studies/ferritin- labs- 1 week prior-Dr.B DP:[id number].

## 2018-02-21 NOTE — Progress Notes (Signed)
[facility] OFFICE PROGRESS NOTE  Patient Care Team: Sherrin Daisy, MD as PCP - General (Family Medicine)   SUMMARY OF ONCOLOGIC HISTORY:  # ANEMIA- multi fact - Iron def/CKD; OCT 2016- START PROCRIT 40K q 2W; DEC 2016- procrit 20k q ; March 2017- Start Procrit 150 mcg q 4W  # CKD-III [Dr.Lateef]; chronic back pain /back braces.   INTERVAL HISTORY:  84 year old female patient with a history of chronic back pain and also anemia likely from iron deficiency CKD is here for follow-up.  Patent is on  Iron by mouth.  Patient recently lost her husband/June 2019.  She is coping up fairly well as per family.  Patient continues to complain of chronic mild fatigue.  Denies any weight loss.  Denies any difficulty swallowing or new onset of constipation or blood in stools black or stools.  Continues to have chronic back pain wears braces.  This is not any worse.  Review of Systems  Constitutional: Positive for malaise/fatigue. Negative for chills, diaphoresis, fever and weight loss.  HENT: Negative for nosebleeds and sore throat.   Eyes: Negative for double vision.  Respiratory: Negative for cough, hemoptysis, sputum production, shortness of breath and wheezing.   Cardiovascular: Negative for chest pain, palpitations, orthopnea and leg swelling.  Gastrointestinal: Negative for abdominal pain, blood in stool, constipation, diarrhea, heartburn, melena, nausea and vomiting.  Genitourinary: Negative for dysuria, frequency and urgency.  Musculoskeletal: Positive for back pain and joint pain.  Skin: Negative.  Negative for itching and rash.  Neurological: Negative for dizziness, tingling, focal weakness, weakness and headaches.  Endo/Heme/Allergies: Does not bruise/bleed easily.  Psychiatric/Behavioral: Negative for depression. The patient is not nervous/anxious and does not have insomnia.      PAST MEDICAL HISTORY :  Past Medical History:  Diagnosis Date  . Anemia   .  Hypertension   . Hypothyroidism   . Insomnia   . Lumbar radiculitis   . Lumbar spondylosis   . Lumbar stenosis with neurogenic claudication   . Stage III chronic kidney disease ([place])     PAST SURGICAL HISTORY :   Past Surgical History:  Procedure Laterality Date  . ABDOMINAL HYSTERECTOMY    . APPENDECTOMY    . TOTAL KNEE ARTHROPLASTY Left 2002    FAMILY HISTORY :   Family History  Problem Relation Age of Onset  . Stroke Mother   . CAD Father   . Stomach cancer Sister   . Breast cancer Sister   . Breast cancer Sister   . Breast cancer Daughter     SOCIAL HISTORY:   Social History   Tobacco Use  . Smoking status: Never Smoker  . Smokeless tobacco: Never Used  Substance Use Topics  . Alcohol use: No    Alcohol/week: 0.0 standard drinks  . Drug use: No    ALLERGIES:  is allergic to celecoxib and clarithromycin.  MEDICATIONS:  Current Outpatient Medications  Medication Sig Dispense Refill  . acetaminophen (TYLENOL) 325 MG tablet Take 650 mg by mouth daily after lunch.    . ALPRAZolam (XANAX) 0.25 MG tablet TAKE (1) TABLET BY MOUTH TWICE DAILY AS NEEDED FOR SLEEP/ANXIETY    . aspirin EC 81 MG tablet Take 81 mg by mouth daily.     . Cetirizine HCl 10 MG CAPS Take 10 mg by mouth once.     . Cyanocobalamin 2500 MCG SUBL Place 1 tablet under the tongue daily.     . ferrous sulfate 325 (65 FE) MG EC tablet  Take 325 mg by mouth 2 (two) times daily.     Marland Kitchen HYDROcodone-acetaminophen (NORCO/VICODIN) 5-325 MG per tablet 1 tablet twice daily    . levothyroxine (SYNTHROID, LEVOTHROID) 75 MCG tablet Take 75 mcg by mouth daily before breakfast.    . lisinopril (PRINIVIL,ZESTRIL) 5 MG tablet Take 5 mg by mouth daily.     . Multiple Vitamin (MULTI-VITAMINS) TABS Take 1 tablet by mouth daily.     . sodium bicarbonate 650 MG tablet Take 650 mg by mouth 2 (two) times daily.     . traZODone (DESYREL) 50 MG tablet Take 50 mg by mouth at bedtime as needed for sleep.      No current  facility-administered medications for this visit.     PHYSICAL EXAMINATION: ECOG PERFORMANCE STATUS: 1 - Symptomatic but completely ambulatory  BP (!) 153/76 (BP Location: Left Arm, Patient Position: Sitting)   Pulse 68   Temp (!) 95 F (35 C) (Tympanic)   Resp 16   Ht 5' (1.524 m)   Wt 110 lb (49.9 kg)   BMI 21.48 kg/m   Filed Weights   02/21/18 1051  Weight: 110 lb (49.9 kg)   Physical Exam  Constitutional: She is oriented to person, place, and time.  Frail-appearing Caucasian female patient.  She is accompanied by daughter.  She walks with a cane.  HENT:  Head: Normocephalic and atraumatic.  Mouth/Throat: Oropharynx is clear and moist. No oropharyngeal exudate.  Eyes: Pupils are equal, round, and reactive to light.  Neck: Normal range of motion. Neck supple.  Cardiovascular: Normal rate and regular rhythm.  Pulmonary/Chest: No respiratory distress. She has no wheezes.  Abdominal: Soft. Bowel sounds are normal. She exhibits no distension and no mass. There is no tenderness. There is no rebound and no guarding.  Musculoskeletal: Normal range of motion. She exhibits no edema or tenderness.  Back brace noted.  Neurological: She is alert and oriented to person, place, and time.  Skin: Skin is warm.  Psychiatric: Affect normal.    LABORATORY DATA:  I have reviewed the data as listed    Component Value Date/Time   NA 136 02/21/2018 1044   NA 139 05/31/2011 1500   K 4.3 02/21/2018 1044   K 5.2 (H) 05/31/2011 1500   CL 100 02/21/2018 1044   CL 101 05/31/2011 1500   CO2 24 02/21/2018 1044   CO2 27 05/31/2011 1500   GLUCOSE 91 02/21/2018 1044   GLUCOSE 94 05/31/2011 1500   BUN 25 (H) 02/21/2018 1044   BUN 33 (H) 05/31/2011 1500   CREATININE 1.19 (H) 02/21/2018 1044   CREATININE 2.06 (H) 05/31/2011 1500   CALCIUM 9.3 02/21/2018 1044   CALCIUM 9.4 05/31/2011 1500   PROT 7.5 02/21/2018 1044   ALBUMIN 4.0 02/21/2018 1044   AST 25 02/21/2018 1044   ALT 10 02/21/2018  1044   ALKPHOS 76 02/21/2018 1044   BILITOT 0.5 02/21/2018 1044   GFRNONAA 40 (L) 02/21/2018 1044   GFRNONAA 25 (L) 05/31/2011 1500   GFRAA 46 (L) 02/21/2018 1044   GFRAA 30 (L) 05/31/2011 1500    No results found for: SPEP, UPEP  Lab Results  Component Value Date   WBC 5.5 02/21/2018   NEUTROABS 3.6 02/21/2018   HGB 11.2 (L) 02/21/2018   HCT 34.5 (L) 02/21/2018   MCV 92.7 02/21/2018   PLT 312 02/21/2018      Chemistry      Component Value Date/Time   NA 136 02/21/2018 1044   NA  139 05/31/2011 1500   K 4.3 02/21/2018 1044   K 5.2 (H) 05/31/2011 1500   CL 100 02/21/2018 1044   CL 101 05/31/2011 1500   CO2 24 02/21/2018 1044   CO2 27 05/31/2011 1500   BUN 25 (H) 02/21/2018 1044   BUN 33 (H) 05/31/2011 1500   CREATININE 1.19 (H) 02/21/2018 1044   CREATININE 2.06 (H) 05/31/2011 1500      Component Value Date/Time   CALCIUM 9.3 02/21/2018 1044   CALCIUM 9.4 05/31/2011 1500   ALKPHOS 76 02/21/2018 1044   AST 25 02/21/2018 1044   ALT 10 02/21/2018 1044   BILITOT 0.5 02/21/2018 1044       ASSESSMENT & PLAN:  Anemia due to stage 3 chronic kidney disease #  Anemia- secondary to CKD-stage III/iron deficiency. Status post 2 Procrit injections/2 IV iron infusion [October/November 2016]; Interestingly patient has not needed any more treatments since then.   #  Patient's Hemoglobin today is 11.2.  No Aranesp today.  Continue p.o. iron.  Discussed regarding IV iron [patient reluctant/daughter preferred].  Await on the iron numbers from today.  Will inform patient.  # DISPOSITION:  # NO Venofer today. # follow-up with me in 6 months- MDPossible Venofer/labs-CBC/BMP iron studies/ferritin- labs- 1 week prior-Dr.B JO:ACZYSA.      Cammie Sickle, MD 02/21/2018 11:56 AM

## 2018-08-18 ENCOUNTER — Other Ambulatory Visit: Payer: Medicare Other

## 2018-08-22 ENCOUNTER — Ambulatory Visit: Payer: Medicare Other

## 2018-08-22 ENCOUNTER — Ambulatory Visit: Payer: Medicare Other | Admitting: Hematology and Oncology

## 2018-08-22 ENCOUNTER — Ambulatory Visit: Payer: Medicare Other | Admitting: Internal Medicine

## 2018-09-18 ENCOUNTER — Other Ambulatory Visit: Payer: Medicare Other

## 2018-09-19 ENCOUNTER — Ambulatory Visit: Payer: Medicare Other

## 2018-09-19 ENCOUNTER — Ambulatory Visit: Payer: Medicare Other | Admitting: Hematology and Oncology

## 2018-10-27 ENCOUNTER — Other Ambulatory Visit: Payer: Self-pay

## 2018-10-30 ENCOUNTER — Other Ambulatory Visit: Payer: Medicare Other

## 2018-10-31 ENCOUNTER — Ambulatory Visit: Payer: Medicare Other

## 2018-10-31 ENCOUNTER — Ambulatory Visit: Payer: Medicare Other | Admitting: Hematology and Oncology

## 2018-11-01 ENCOUNTER — Ambulatory Visit: Payer: Medicare Other

## 2018-11-06 ENCOUNTER — Other Ambulatory Visit: Payer: Medicare Other

## 2018-11-07 ENCOUNTER — Ambulatory Visit: Payer: Medicare Other

## 2018-11-07 ENCOUNTER — Ambulatory Visit: Payer: Medicare Other | Admitting: Hematology and Oncology

## 2018-11-28 ENCOUNTER — Other Ambulatory Visit: Payer: Medicare Other

## 2018-11-29 ENCOUNTER — Ambulatory Visit: Payer: Medicare Other

## 2018-11-29 ENCOUNTER — Ambulatory Visit: Payer: Medicare Other | Admitting: Hematology and Oncology

## 2019-01-01 ENCOUNTER — Other Ambulatory Visit: Payer: Medicare Other

## 2019-01-02 ENCOUNTER — Ambulatory Visit: Payer: Medicare Other

## 2019-01-02 ENCOUNTER — Ambulatory Visit: Payer: Medicare Other | Admitting: Hematology and Oncology

## 2019-07-14 ENCOUNTER — Other Ambulatory Visit: Payer: Self-pay | Admitting: Nurse Practitioner

## 2020-05-12 DIAGNOSIS — D509 Iron deficiency anemia, unspecified: Secondary | ICD-10-CM | POA: Insufficient documentation

## 2020-05-12 NOTE — Progress Notes (Signed)
[facility]  [address], [address] [place], [place] [address] Phone: [phone]  Fax: [phone]   Clinic Day:  05/13/2020  Referring physician: Anthonette Legato, MD  Chief Complaint: Lynn Rosario is a 87 y.o. female with anemia of stage 3b chronic kidney disease and iron deficiency who is referred in consultation by Dr. Holley Rosario for assessment and management.   HPI: The patient was previously seen by Dr. Rogue Rosario. She was felt to have anemia due to iron deficiency and chronic kidney disease. She received Procrit 40,000 units on 03/04/2015 and 03/10/2015. She received Feraheme on 03/05/2015 and 03/11/2015.  She was last seen by Dr. Rogue Rosario on 02/21/2018. She was on oral iron. She had chronic fatigue. She had not needed any additional treatments since 2016. Follow-up was planned for 6 months.  Due to the COVID-19 pandemic, Dr. Holley Rosario suggested that the patient obtain labs at his office as her anemia was stable.  The patient saw Dr. Holley Rosario on 04/24/2020 to follow-up on chronic kidney disease stage IIIb, proteinuria, hypertension, metabolic acidosis and anemia of chronic kidney disease.  GFR was estimated at 43 ml/minute.  She was on lisinopril.  Hemoglobin was 11.4 without indication for Procrit.  Sodium bicarbonate was discontinued she was using Lasix periodically for lower extremity edema.  She has a follow-up on 08/23/2020.  Hematocrit was 27.0, hemoglobin 8.4, MCV 96.1, platelets 542,000, WBC 8,600 on 04/24/2020.  She was referred back to hematology.  Labs followed: 01/07/2014:  Hematocrit 28.4, hemoglobin   9.0, MCV 84.0, platelets 537,000, WBC 7,400. Ferritin 105. Iron saturation   9%. TIBC 247. 12/27/2014:  Hematocrit 30.5, hemoglobin   9.9, MCV 87.2, platelets 567,000, WBC 7,100. Ferritin 143. Iron saturation 20%. TIBC 204. 02/24/2015:  Hematocrit 28.4, hemoglobin   8.9, MCV 87.0, platelets 517,000, WBC 5,900. Ferritin 146. Iron saturation   8%. TIBC  192. 05/05/2016:  Hematocrit 36.9, hemoglobin 12.0, MCV 96.2, platelets 270,000, WBC 5,200. Ferritin 211. Iron saturation 27%. TIBC 253. 08/24/2016:  Hematocrit 36.2, hemoglobin 11.9, MCV 95.5, platelets 308,000, WBC 6,500. Ferritin 221. Iron saturation 15%. TIBC 248. 02/21/2018:  Hematocrit 34.5, hemoglobin 11.2, MCV 92.7, platelets 312,000, WBC 5,500. Ferritin   80. Iron saturation 23%. TIBC 259. 03/02/2019:  Hematocrit 33.4, hemoglobin 11.0, MCV 92.3, platelets 299,000, WBC 5,700. ---------------  Iron saturation 19%. TIBC 285. 07/13/2019:  Hematocrit 34.8, hemoglobin 11.4, MCV 94.1, platelets 349,000, WBC 6,800. 04/24/2020:  Hematocrit 27.0, hemoglobin   8.4, MCV 96.1, platelets 542,000, WBC 8,600.   Vitamin B12 was >1,500 on 02/26/2019. Renal function panel on 04/24/2020 revealed a creatinine of 1.41, calcium 8.7, albumin 3.1.  Protein immunoelectrophoresis on 12/18/2013 showed a normal immunofixation pattern. Kappa free light chains were 41.58, lambda free light chains 29.43, ratio 1.41.  Symptomatically, she has been "good". She reports sinus headaches, allergies, shortness of breath on exertion, leg swelling, easy bruising on upper extremities, rare diarrhea, arthritis in her knees and back, and poor balance. She wears a back brace at all times and takes hydrocodone daily for back pain. She stays cold. The patient has had itching and burning with urination x 5-6 months. Dr. Holley Rosario prescribed her an antibiotic and fluconazole.  She denies fevers, sweats, changes in vision, runny nose, sore throat, cough, chest pain, palpitations, ice pica or any other cravings, nausea, vomiting, blood in the stool, black stools, reflux, skin changes, restless legs, numbness, weakness, and bleeding of any kind.  She drinks coffee, water, and cranberry juice. She eats 3 meals per day. She eats meat  once or twice daily and green leafy vegetables everyday. She snacks on Ritz crackers and butter cookies.  The  patient has taken oral iron once daily for years but does not take it with vitamin C. She takes Synthroid for hypothyroidism. She takes Xanax to help her sleep at night and needs a refill.  She is able to perform ADLs independently. She uses a cane most times. One of her family members visits her at least once per day.   She has not had a colonoscopy in 15-20 years. She stopped driving 5-02 years ago. She had a left knee replacement. The patient had an allergic reaction to the flu shot about 10 years ago that took a significant toll on her. She has not fully gained her strength back since then, but is doing well.    Past Medical History:  Diagnosis Date  . Anemia   . Hypertension   . Hypothyroidism   . Insomnia   . Lumbar radiculitis   . Lumbar spondylosis   . Lumbar stenosis with neurogenic claudication   . Stage III chronic kidney disease [facility])     Past Surgical History:  Procedure Laterality Date  . ABDOMINAL HYSTERECTOMY    . APPENDECTOMY    . TOTAL KNEE ARTHROPLASTY Left 2002    Family History  Problem Relation Age of Onset  . Stroke Mother   . CAD Father   . Stomach cancer Sister   . Breast cancer Sister   . Breast cancer Sister   . Breast cancer Daughter   The patient denies a family history of blood disorders or kidney diseases.   Social History:  reports that she has never smoked. She has never used smokeless tobacco. She reports that she does not drink alcohol and does not use drugs. She does not use drugs or drink alcohol. She denies any exposure to radiation or toxins. She has not worked in 15-17 years. She worked in a [employer]. The patient lives in [place]. She lives three doors down from her daughter and POA, Lynn Rosario. The patient is accompanied by Lynn Rosario today.  Allergies:  Allergies  Allergen Reactions  . Celecoxib Other (See Comments)    Decreased kidney function   . Clarithromycin Other (See Comments)    Loss of taste     Current  Medications: Current Outpatient Medications  Medication Sig Dispense Refill  . acetaminophen (TYLENOL) 325 MG tablet Take 650 mg by mouth daily after lunch.    . ALPRAZolam (XANAX) 0.25 MG tablet TAKE (1) TABLET BY MOUTH TWICE DAILY AS NEEDED FOR SLEEP/ANXIETY    . amLODipine (NORVASC) 2.5 MG tablet TAKE (1) TABLET BY MOUTH EVERY DAY    . aspirin EC 81 MG tablet Take 81 mg by mouth daily.     . Cetirizine HCl 10 MG CAPS Take 10 mg by mouth once.     . Cyanocobalamin 2500 MCG SUBL Place 1 tablet under the tongue daily.     . Cyanocobalamin 2500 MCG SUBL Place under the tongue.    . diphenhydramine-acetaminophen (TYLENOL PM) 25-500 MG TABS tablet Take by mouth.    . ferrous sulfate 325 (65 FE) MG EC tablet Take 325 mg by mouth 2 (two) times daily.     . ferrous sulfate 325 (65 FE) MG EC tablet Take by mouth.    . furosemide (LASIX) 20 MG tablet Take by mouth.    Marland Kitchen HYDROcodone-acetaminophen (NORCO/VICODIN) 5-325 MG per tablet 1 tablet twice daily    .  levothyroxine (SYNTHROID) 75 MCG tablet TAKE 1 TABLET ON AN EMPTY STOMACH WITH A GLASS OF WATER AT LEAST 30 TO 60 MINUTES BEFORE BREAKFAST.    Marland Kitchen levothyroxine (SYNTHROID, LEVOTHROID) 75 MCG tablet Take 75 mcg by mouth daily before breakfast.    . lisinopril (PRINIVIL,ZESTRIL) 5 MG tablet Take 5 mg by mouth daily.     . Multiple Vitamin (MULTI-VITAMINS) TABS Take 1 tablet by mouth daily.     . traZODone (DESYREL) 50 MG tablet Take 50 mg by mouth at bedtime as needed for sleep.     . sodium bicarbonate 650 MG tablet Take 650 mg by mouth 2 (two) times daily.  (Patient not taking: Reported on 05/13/2020)     No current facility-administered medications for this visit.    Review of Systems  Constitutional: Negative for chills, diaphoresis, fever, malaise/fatigue and weight loss.  HENT: Negative for congestion, ear discharge, ear pain, hearing loss, nosebleeds, sinus pain, sore throat and tinnitus.   Eyes: Negative for blurred vision.  Respiratory:  Positive for shortness of breath (on exertion). Negative for cough, hemoptysis and sputum production.   Cardiovascular: Positive for leg swelling. Negative for chest pain and palpitations.  Gastrointestinal: Positive for diarrhea (rare). Negative for abdominal pain, blood in stool, constipation, heartburn, melena, nausea and vomiting.  Genitourinary: Positive for dysuria (burning and itching, on and off x5-6 months). Negative for frequency, hematuria and urgency.  Musculoskeletal: Positive for back pain (due to arthritis and scoliosis, wears a brace) and joint pain (knees). Negative for myalgias and neck pain.  Skin: Negative for itching and rash.  Neurological: Positive for headaches (due to allergies). Negative for dizziness, tingling, sensory change and weakness.       Poor balance.  Endo/Heme/Allergies: Positive for environmental allergies. Bruises/bleeds easily (upper extremities).       Stays cold  Psychiatric/Behavioral: Negative for depression and memory loss. The patient is nervous/anxious (takes Xanax at night). The patient does not have insomnia.   All other systems reviewed and are negative.  Performance status (ECOG): 1  Vitals Blood pressure 117/87, pulse 87, temperature (!) 96.9 F (36.1 C), temperature source Tympanic, resp. rate 16, weight 115 lb 1.3 oz (52.2 kg), SpO2 98 %.   Physical Exam Vitals and nursing note reviewed.  Constitutional:      General: She is not in acute distress.    Appearance: She is not diaphoretic.  HENT:     Head: Normocephalic and atraumatic.     Comments: Short gray hair.    Mouth/Throat:     Mouth: Mucous membranes are moist.     Pharynx: Oropharynx is clear.  Eyes:     General: No scleral icterus.    Extraocular Movements: Extraocular movements intact.     Conjunctiva/sclera: Conjunctivae normal.     Pupils: Pupils are equal, round, and reactive to light.     Comments: Glasses.  Blue eyes.  Cardiovascular:     Rate and Rhythm: Normal  rate and regular rhythm.     Heart sounds: Normal heart sounds. No murmur heard.   Pulmonary:     Effort: Pulmonary effort is normal. No respiratory distress.     Breath sounds: Normal breath sounds. No wheezing or rales.  Chest:     Chest wall: No tenderness.  Breasts:     Right: No axillary adenopathy or supraclavicular adenopathy.     Left: No axillary adenopathy or supraclavicular adenopathy.    Abdominal:     General: Bowel sounds are normal. There is  no distension.     Palpations: Abdomen is soft. There is no mass.     Tenderness: There is abdominal tenderness (left side with palpation). There is no guarding or rebound.  Musculoskeletal:        General: Tenderness (BLE) present. No swelling. Normal range of motion.     Cervical back: Normal range of motion and neck supple.     Right lower leg: Edema (3+) present.     Left lower leg: Edema (3+, L>R) present.     Comments: Back brace in place- removed for exam.  Lymphadenopathy:     Head:     Right side of head: No preauricular, posterior auricular or occipital adenopathy.     Left side of head: No preauricular, posterior auricular or occipital adenopathy.     Cervical: No cervical adenopathy.     Upper Body:     Right upper body: No supraclavicular or axillary adenopathy.     Left upper body: No supraclavicular or axillary adenopathy.     Lower Body: No right inguinal adenopathy. No left inguinal adenopathy.  Skin:    General: Skin is warm and dry.  Neurological:     Mental Status: She is alert and oriented to person, place, and time.  Psychiatric:        Behavior: Behavior normal.        Thought Content: Thought content normal.        Judgment: Judgment normal.    No visits with results within 3 Day(s) from this visit.  Latest known visit with results is:  Appointment on 02/21/2018  Component Date Value Ref Range Status  . Ferritin 02/21/2018 80  11 - 307 ng/mL Final   Performed at [facility], [address] [place], [place] [address]  . Iron 02/21/2018 60  28 - 170 ug/dL Final  . TIBC 02/21/2018 259  250 - 450 ug/dL Final  . Saturation Ratios 02/21/2018 23  10.4 - 31.8 % Final  . UIBC 02/21/2018 199  ug/dL Final   Performed at [facility], [address], [place], [place] [address]  . Sodium 02/21/2018 136  135 - 145 mmol/L Final  . Potassium 02/21/2018 4.3  3.5 - 5.1 mmol/L Final  . Chloride 02/21/2018 100  98 - 111 mmol/L Final  . CO2 02/21/2018 24  22 - 32 mmol/L Final  . Glucose, Bld 02/21/2018 91  70 - 99 mg/dL Final  . BUN 02/21/2018 25* 8 - 23 mg/dL Final  . Creatinine, Ser 02/21/2018 1.19* 0.44 - 1.00 mg/dL Final  . Calcium 02/21/2018 9.3  8.9 - 10.3 mg/dL Final  . Total Protein 02/21/2018 7.5  6.5 - 8.1 g/dL Final  . Albumin 02/21/2018 4.0  3.5 - 5.0 g/dL Final  . AST 02/21/2018 25  15 - 41 U/L Final  . ALT 02/21/2018 10  0 - 44 U/L Final  . Alkaline Phosphatase 02/21/2018 76  38 - 126 U/L Final  . Total Bilirubin 02/21/2018 0.5  0.3 - 1.2 mg/dL Final  . GFR calc non Af Amer 02/21/2018 40* >60 mL/min Final  . GFR calc Af Amer 02/21/2018 46* >60 mL/min Final   Comment: (NOTE) The eGFR has been calculated using the CKD EPI equation. This calculation has not been validated in all clinical situations. eGFR's persistently <60 mL/min signify possible Chronic Kidney Disease.   . Anion gap 02/21/2018 12  5 - 15 Final   Performed at [facility], [address],  [place], [place] [address]  . WBC 02/21/2018 5.5  4.0 - 10.5 K/uL Final  . RBC 02/21/2018 3.72* 3.87 - 5.11 MIL/uL Final  . Hemoglobin 02/21/2018 11.2* 12.0 - 15.0 g/dL Final  . HCT 02/21/2018 34.5* 36.0 - 46.0 % Final  . MCV 02/21/2018 92.7  80.0 - 100.0 fL Final  . MCH 02/21/2018 30.1  26.0 - 34.0 pg Final  . MCHC 02/21/2018 32.5  30.0 - 36.0 g/dL Final  . RDW 02/21/2018 12.7  11.5 - 15.5 % Final  . Platelets 02/21/2018 312  150 - 400 K/uL Final  . nRBC 02/21/2018 0.0  0.0 - 0.2 %  Final  . Neutrophils Relative % 02/21/2018 65  % Final  . Neutro Abs 02/21/2018 3.6  1.7 - 7.7 K/uL Final  . Lymphocytes Relative 02/21/2018 22  % Final  . Lymphs Abs 02/21/2018 1.2  0.7 - 4.0 K/uL Final  . Monocytes Relative 02/21/2018 10  % Final  . Monocytes Absolute 02/21/2018 0.6  0.1 - 1.0 K/uL Final  . Eosinophils Relative 02/21/2018 2  % Final  . Eosinophils Absolute 02/21/2018 0.1  0.0 - 0.5 K/uL Final  . Basophils Relative 02/21/2018 1  % Final  . Basophils Absolute 02/21/2018 0.1  0.0 - 0.1 K/uL Final  . Immature Granulocytes 02/21/2018 0  % Final  . Abs Immature Granulocytes 02/21/2018 0.01  0.00 - 0.07 K/uL Final   Performed at [facility], [address], [place], [place] [address]    Assessment:  Brenisha Tsui is a 87 y.o. female with anemia of stage 3b chronic kidney disease and iron deficiency.  Diet appears good.  She has taken oral iron once daily for years but does not take it with vitamin C. She takes Synthroid for hypothyroidism.  CBC on 04/24/2020 revealed a hematocrit 27.0, hemoglobin 8.4, MCV 96.1, platelets 542,000, WBC 8,600.   She received Epogen 40,000 units on 03/04/2015 and 03/10/2015.  Creatinine was 1.41 on 04/24/2020  She has a history of iron deficiency anemia.  She received Feraheme on 03/05/2015 and 03/11/2015.  She has not had a colonoscopy in 15-20 years.  The patient does not plan to receive the COVID-19 vaccine. She had a bad reaction to the flu vaccine 10 years ago.  Symptomatically, she feels "good".  She reports shortness of breath on exertion, leg swelling, easy bruising, rare diarrhea, arthritis in her knees and back, and poor balance. She wears a back brace at all times and takes hydrocodone daily for back pain. She denies fevers, sweats, ice pica or any other cravings, nausea, vomiting, or bleeding (melena, hematochezia, hematuria or vaginal bleeding).  Plan: 1.   Labs today:  CBC with diff, retic, ferritin, iron studies,  sed rate, B12, folate, myeloma panel, FLCA, TSH. 2.   Iron deficiency anemia  Patient has been on oral iron for years.   Encourage her to take iron with OJ or vitamin C.  She denies any bleeding.  Last colonoscopy was 15-20 years ago.  Discuss assessing iron stores.  Preauth Venofer. 3.   Anemia of stage 3b chronic kidney disease   Renal function panel on 04/24/2020 revealed a creatinine of 1.41, calcium 8.7, albumin 3.1.  GFR was estimated at 43 ml/minute.  Hemoglobin has dropped from 11.4 to 8.4.  Discuss additional work-up.  Preauth Retacrit. 4.   Patient requesting refill for Xanax  RN:  Please contact Toni Arthurs, NP re: Xanax. 5.   RTC in 1 week for MD assessment, review of labs,  discussion regarding direction of therapy, and +/- Retacrit.  I discussed the assessment and treatment plan with the patient.  The patient was provided an opportunity to ask questions and all were answered.  The patient agreed with the plan and demonstrated an understanding of the instructions.  The patient was advised to call back if the symptoms worsen or if the condition fails to improve as anticipated.  I provided 31 minutes of face-to-face time during this this encounter and > 50% was spent counseling as documented under my assessment and plan. An additional 15 minutes were spent reviewing her chart (Epic and Care Everywhere) including notes, labs, and imaging studies.    Equilla Que C. Mike Gip, MD, PhD    05/13/2020, 2:13 PM  I, Mirian Mo Tufford, am acting as Education administrator for [employer]. Mike Gip, MD, PhD.  I, Tilda Samudio C. Mike Gip, MD, have reviewed the above documentation for accuracy and completeness, and I agree with the above.

## 2020-05-13 ENCOUNTER — Encounter: Payer: Self-pay | Admitting: Hematology and Oncology

## 2020-05-13 ENCOUNTER — Inpatient Hospital Stay: Payer: Medicare Other | Attending: Hematology and Oncology | Admitting: Hematology and Oncology

## 2020-05-13 ENCOUNTER — Other Ambulatory Visit: Payer: Self-pay

## 2020-05-13 ENCOUNTER — Inpatient Hospital Stay: Payer: Medicare Other

## 2020-05-13 VITALS — BP 117/87 | HR 87 | Temp 96.9°F | Resp 16 | Wt 115.1 lb

## 2020-05-13 DIAGNOSIS — D631 Anemia in chronic kidney disease: Secondary | ICD-10-CM | POA: Insufficient documentation

## 2020-05-13 DIAGNOSIS — E039 Hypothyroidism, unspecified: Secondary | ICD-10-CM | POA: Diagnosis not present

## 2020-05-13 DIAGNOSIS — I129 Hypertensive chronic kidney disease with stage 1 through stage 4 chronic kidney disease, or unspecified chronic kidney disease: Secondary | ICD-10-CM | POA: Diagnosis not present

## 2020-05-13 DIAGNOSIS — N1832 Chronic kidney disease, stage 3b: Secondary | ICD-10-CM | POA: Insufficient documentation

## 2020-05-13 DIAGNOSIS — D509 Iron deficiency anemia, unspecified: Secondary | ICD-10-CM

## 2020-05-13 DIAGNOSIS — D649 Anemia, unspecified: Secondary | ICD-10-CM

## 2020-05-13 LAB — RETICULOCYTES
Immature Retic Fract: 23.1 % — ABNORMAL HIGH (ref 2.3–15.9)
RBC.: 2.9 MIL/uL — ABNORMAL LOW (ref 3.87–5.11)
Retic Count, Absolute: 76.6 10*3/uL (ref 19.0–186.0)
Retic Ct Pct: 2.6 % (ref 0.4–3.1)

## 2020-05-13 LAB — CBC WITH DIFFERENTIAL/PLATELET
Abs Immature Granulocytes: 0.04 10*3/uL (ref 0.00–0.07)
Basophils Absolute: 0.1 10*3/uL (ref 0.0–0.1)
Basophils Relative: 1 %
Eosinophils Absolute: 0 10*3/uL (ref 0.0–0.5)
Eosinophils Relative: 0 %
HCT: 27.7 % — ABNORMAL LOW (ref 36.0–46.0)
Hemoglobin: 8.6 g/dL — ABNORMAL LOW (ref 12.0–15.0)
Immature Granulocytes: 0 %
Lymphocytes Relative: 11 %
Lymphs Abs: 1 10*3/uL (ref 0.7–4.0)
MCH: 29.1 pg (ref 26.0–34.0)
MCHC: 31 g/dL (ref 30.0–36.0)
MCV: 93.6 fL (ref 80.0–100.0)
Monocytes Absolute: 0.9 10*3/uL (ref 0.1–1.0)
Monocytes Relative: 10 %
Neutro Abs: 7.1 10*3/uL (ref 1.7–7.7)
Neutrophils Relative %: 78 %
Platelets: 599 10*3/uL — ABNORMAL HIGH (ref 150–400)
RBC: 2.96 MIL/uL — ABNORMAL LOW (ref 3.87–5.11)
RDW: 13.3 % (ref 11.5–15.5)
WBC: 9.1 10*3/uL (ref 4.0–10.5)
nRBC: 0 % (ref 0.0–0.2)

## 2020-05-13 LAB — SEDIMENTATION RATE: Sed Rate: 73 mm/hr — ABNORMAL HIGH (ref 0–30)

## 2020-05-13 NOTE — Progress Notes (Signed)
Patient here for initial oncology appointment, expresses concerns of urinary symptoms and leg swelling.

## 2020-05-14 ENCOUNTER — Telehealth: Payer: Self-pay

## 2020-05-14 LAB — IRON AND TIBC
Iron: 16 ug/dL — ABNORMAL LOW (ref 28–170)
Saturation Ratios: 5 % — ABNORMAL LOW (ref 10.4–31.8)
TIBC: 319 ug/dL (ref 250–450)
UIBC: 303 ug/dL

## 2020-05-14 LAB — FERRITIN: Ferritin: 21 ng/mL (ref 11–307)

## 2020-05-14 LAB — KAPPA/LAMBDA LIGHT CHAINS
Kappa free light chain: 35.1 mg/L — ABNORMAL HIGH (ref 3.3–19.4)
Kappa, lambda light chain ratio: 1.15 (ref 0.26–1.65)
Lambda free light chains: 30.6 mg/L — ABNORMAL HIGH (ref 5.7–26.3)

## 2020-05-14 LAB — TSH: TSH: 2.675 u[IU]/mL (ref 0.350–4.500)

## 2020-05-14 LAB — VITAMIN B12: Vitamin B-12: 4578 pg/mL — ABNORMAL HIGH (ref 180–914)

## 2020-05-14 LAB — FOLATE: Folate: 70.7 ng/mL (ref >5.9)

## 2020-05-14 NOTE — Telephone Encounter (Signed)
Spoke with pts daughter. She was unaware of the exact amount that patient was taking but will send a mychart message in a bit to let us know when daughter sets it up. Did advise to stop taking right now.

## 2020-05-15 ENCOUNTER — Encounter: Payer: Self-pay | Admitting: Hematology and Oncology

## 2020-05-15 LAB — MULTIPLE MYELOMA PANEL, SERUM
Albumin SerPl Elph-Mcnc: 3 g/dL (ref 2.9–4.4)
Albumin/Glob SerPl: 1 (ref 0.7–1.7)
Alpha 1: 0.5 g/dL — ABNORMAL HIGH (ref 0.0–0.4)
Alpha2 Glob SerPl Elph-Mcnc: 1.2 g/dL — ABNORMAL HIGH (ref 0.4–1.0)
B-Globulin SerPl Elph-Mcnc: 0.9 g/dL (ref 0.7–1.3)
Gamma Glob SerPl Elph-Mcnc: 0.6 g/dL (ref 0.4–1.8)
Globulin, Total: 3.1 g/dL (ref 2.2–3.9)
IgA: 57 mg/dL — ABNORMAL LOW (ref 64–422)
IgG (Immunoglobin G), Serum: 546 mg/dL — ABNORMAL LOW (ref 586–1602)
IgM (Immunoglobulin M), Srm: 85 mg/dL (ref 26–217)
Total Protein ELP: 6.1 g/dL (ref 6.0–8.5)

## 2020-05-19 NOTE — Progress Notes (Deleted)
[facility]  [address], [address] [place], [place] [address] Phone: [phone]  Fax: [phone]   Clinic Day:  05/19/2020  Referring physician: Sherrin Daisy, MD  Chief Complaint: Lynn Rosario is a 87 y.o. female with anemia of stage 3b chronic kidney disease and iron deficiency who is seen for review of work-up and discussion regarding direction of therapy.  HPI: The patient was last seen in the hematology clinic on 05/13/2020 for new patient assessment. At that time, she felt "good".  She reported shortness of breath on exertion, leg swelling, easy bruising, rare diarrhea, arthritis in her knees and back, and poor balance. She wore a back brace at all times and took hydrocodone daily for back pain. She denied fevers, sweats, ice pica or any other cravings, nausea, vomiting, or bleeding (melena, hematochezia, hematuria or vaginal bleeding).  Work-up revealed a hematocrit of 27.7, hemoglobin 8.6, MCV 93.6, platelets 599,000, WBC 9,100. Ferritin was 21 with an iron saturation of 5% and a TIBC of 319.  Sed rate was 73. Reticulocyte count was 2.6%. TSH was 2.675. Vitamin B12 was 4,578, folate 70.7. No M spike was observed. Kappa free light chains were 35.1, lambda free light chains 30.6, ratio 1.15.   During the interim, ***    Past Medical History:  Diagnosis Date  . Anemia   . Hypertension   . Hypothyroidism   . Insomnia   . Lumbar radiculitis   . Lumbar spondylosis   . Lumbar stenosis with neurogenic claudication   . Stage III chronic kidney disease [facility])     Past Surgical History:  Procedure Laterality Date  . ABDOMINAL HYSTERECTOMY    . APPENDECTOMY    . TOTAL KNEE ARTHROPLASTY Left 2002    Family History  Problem Relation Age of Onset  . Stroke Mother   . CAD Father   . Stomach cancer Sister   . Breast cancer Sister   . Breast cancer Sister   . Breast cancer Daughter   The patient denies a family history of blood disorders or  kidney diseases.   Social History:  reports that she has never smoked. She has never used smokeless tobacco. She reports that she does not drink alcohol and does not use drugs. She does not use drugs or drink alcohol. She denies any exposure to radiation or toxins. She has not worked in 15-17 years. She worked in a [employer], [employer]. The patient lives in [place]. She lives three doors down from her daughter and POA, Lynn Rosario. The patient is accompanied by Lynn Rosario*** today.  Allergies:  Allergies  Allergen Reactions  . Celecoxib Other (See Comments)    Decreased kidney function   . Clarithromycin Other (See Comments)    Loss of taste     Current Medications: Current Outpatient Medications  Medication Sig Dispense Refill  . acetaminophen (TYLENOL) 325 MG tablet Take 650 mg by mouth daily after lunch.    . ALPRAZolam (XANAX) 0.25 MG tablet TAKE (1) TABLET BY MOUTH TWICE DAILY AS NEEDED FOR SLEEP/ANXIETY    . amLODipine (NORVASC) 2.5 MG tablet TAKE (1) TABLET BY MOUTH EVERY DAY    . aspirin EC 81 MG tablet Take 81 mg by mouth daily.     . Cetirizine HCl 10 MG CAPS Take 10 mg by mouth once.     . Cyanocobalamin 2500 MCG SUBL Place 1 tablet under the tongue daily.     . Cyanocobalamin 2500 MCG SUBL Place under the tongue.    Marland Kitchen  diphenhydramine-acetaminophen (TYLENOL PM) 25-500 MG TABS tablet Take by mouth.    . ferrous sulfate 325 (65 FE) MG EC tablet Take 325 mg by mouth 2 (two) times daily.     . ferrous sulfate 325 (65 FE) MG EC tablet Take by mouth.    . furosemide (LASIX) 20 MG tablet Take by mouth.    Marland Kitchen HYDROcodone-acetaminophen (NORCO/VICODIN) 5-325 MG per tablet 1 tablet twice daily    . levothyroxine (SYNTHROID) 75 MCG tablet TAKE 1 TABLET ON AN EMPTY STOMACH WITH A GLASS OF WATER AT LEAST 30 TO 60 MINUTES BEFORE BREAKFAST.    Marland Kitchen levothyroxine (SYNTHROID, LEVOTHROID) 75 MCG tablet Take 75 mcg by mouth daily before breakfast.    . lisinopril (PRINIVIL,ZESTRIL) 5 MG tablet Take  5 mg by mouth daily.     . Multiple Vitamin (MULTI-VITAMINS) TABS Take 1 tablet by mouth daily.     . sodium bicarbonate 650 MG tablet Take 650 mg by mouth 2 (two) times daily.  (Patient not taking: Reported on 05/13/2020)    . traZODone (DESYREL) 50 MG tablet Take 50 mg by mouth at bedtime as needed for sleep.      No current facility-administered medications for this visit.    Review of Systems  Constitutional: Negative for chills, diaphoresis, fever, malaise/fatigue and weight loss.  HENT: Negative for congestion, ear discharge, ear pain, hearing loss, nosebleeds, sinus pain, sore throat and tinnitus.   Eyes: Negative for blurred vision.  Respiratory: Positive for shortness of breath (on exertion). Negative for cough, hemoptysis and sputum production.   Cardiovascular: Positive for leg swelling. Negative for chest pain and palpitations.  Gastrointestinal: Positive for diarrhea (rare). Negative for abdominal pain, blood in stool, constipation, heartburn, melena, nausea and vomiting.  Genitourinary: Positive for dysuria (burning and itching, on and off x5-6 months). Negative for frequency, hematuria and urgency.  Musculoskeletal: Positive for back pain (due to arthritis and scoliosis, wears a brace) and joint pain (knees). Negative for myalgias and neck pain.  Skin: Negative for itching and rash.  Neurological: Positive for headaches (due to allergies). Negative for dizziness, tingling, sensory change and weakness.       Poor balance.  Endo/Heme/Allergies: Positive for environmental allergies. Bruises/bleeds easily (upper extremities).       Stays cold  Psychiatric/Behavioral: Negative for depression and memory loss. The patient is nervous/anxious (takes Xanax at night). The patient does not have insomnia.   All other systems reviewed and are negative.  Performance status (ECOG): 1***  Vitals There were no vitals taken for this visit.   Physical Exam Vitals and nursing note reviewed.   Constitutional:      General: She is not in acute distress.    Appearance: She is not diaphoretic.  HENT:     Head: Normocephalic and atraumatic.     Comments: Short gray hair.    Mouth/Throat:     Mouth: Mucous membranes are moist.     Pharynx: Oropharynx is clear.  Eyes:     General: No scleral icterus.    Extraocular Movements: Extraocular movements intact.     Conjunctiva/sclera: Conjunctivae normal.     Pupils: Pupils are equal, round, and reactive to light.     Comments: Glasses.  Blue eyes.  Cardiovascular:     Rate and Rhythm: Normal rate and regular rhythm.     Heart sounds: Normal heart sounds. No murmur heard.   Pulmonary:     Effort: Pulmonary effort is normal. No respiratory distress.  Breath sounds: Normal breath sounds. No wheezing or rales.  Chest:     Chest wall: No tenderness.  Breasts:     Right: No axillary adenopathy or supraclavicular adenopathy.     Left: No axillary adenopathy or supraclavicular adenopathy.    Abdominal:     General: Bowel sounds are normal. There is no distension.     Palpations: Abdomen is soft. There is no mass.     Tenderness: There is abdominal tenderness (left side with palpation). There is no guarding or rebound.  Musculoskeletal:        General: Tenderness (BLE) present. No swelling. Normal range of motion.     Cervical back: Normal range of motion and neck supple.     Right lower leg: Edema (3+) present.     Left lower leg: Edema (3+, L>R) present.     Comments: Back brace in place- removed for exam.  Lymphadenopathy:     Head:     Right side of head: No preauricular, posterior auricular or occipital adenopathy.     Left side of head: No preauricular, posterior auricular or occipital adenopathy.     Cervical: No cervical adenopathy.     Upper Body:     Right upper body: No supraclavicular or axillary adenopathy.     Left upper body: No supraclavicular or axillary adenopathy.     Lower Body: No right inguinal  adenopathy. No left inguinal adenopathy.  Skin:    General: Skin is warm and dry.  Neurological:     Mental Status: She is alert and oriented to person, place, and time.  Psychiatric:        Behavior: Behavior normal.        Thought Content: Thought content normal.        Judgment: Judgment normal.    No visits with results within 3 Day(s) from this visit.  Latest known visit with results is:  Appointment on 05/13/2020  Component Date Value Ref Range Status  . IgG (Immunoglobin G), Serum 05/13/2020 546* 586 - 1,602 mg/dL Final  . IgA 05/13/2020 57* 64 - 422 mg/dL Final  . IgM (Immunoglobulin M), Srm 05/13/2020 85  26 - 217 mg/dL Final  . Total Protein ELP 05/13/2020 6.1  6.0 - 8.5 g/dL Corrected  . Albumin SerPl Elph-Mcnc 05/13/2020 3.0  2.9 - 4.4 g/dL Corrected  . Alpha 1 05/13/2020 0.5* 0.0 - 0.4 g/dL Corrected  . Alpha2 Glob SerPl Elph-Mcnc 05/13/2020 1.2* 0.4 - 1.0 g/dL Corrected  . B-Globulin SerPl Elph-Mcnc 05/13/2020 0.9  0.7 - 1.3 g/dL Corrected  . Gamma Glob SerPl Elph-Mcnc 05/13/2020 0.6  0.4 - 1.8 g/dL Corrected  . M Protein SerPl Elph-Mcnc 05/13/2020 Not Observed  Not Observed g/dL Corrected  . Globulin, Total 05/13/2020 3.1  2.2 - 3.9 g/dL Corrected  . Albumin/Glob SerPl 05/13/2020 1.0  0.7 - 1.7 Corrected  . IFE 1 05/13/2020 Comment   Corrected   Comment: (NOTE) The immunofixation pattern appears unremarkable. Evidence of monoclonal protein is not apparent.   . Please Note 05/13/2020 Comment   Corrected   Comment: (NOTE) Protein electrophoresis scan will follow via computer, mail, or courier delivery. Performed At: [facility] [place], [place] [id number] Rush Farmer MD [id number]   . TSH 05/13/2020 2.675  0.350 - 4.500 uIU/mL Final   Comment: Performed by a 3rd Generation assay with a functional sensitivity of <=0.01 uIU/mL. Performed at [facility], [address] [place], [place] [address]   . Kappa  free light chain  05/13/2020 35.1* 3.3 - 19.4 mg/L Final  . Lamda free light chains 05/13/2020 30.6* 5.7 - 26.3 mg/L Final  . Kappa, lamda light chain ratio 05/13/2020 1.15  0.26 - 1.65 Final   Comment: (NOTE) Performed At: [facility] [place], [place] [id number] Rush Farmer MD [id number]   . Folate 05/13/2020 70.7  >5.9 ng/mL Corrected   Comment: RESULTS CONFIRMED BY MANUAL DILUTION Performed at [facility], [address] [place], [place] [address] CORRECTED ON 01/05 AT 0105: PREVIOUSLY REPORTED AS >47.0   . Vitamin B-12 05/13/2020 4,578* 180 - 914 pg/mL Final   Comment: (NOTE) This assay is not validated for testing neonatal or myeloproliferative syndrome specimens for Vitamin B12 levels. Performed at [facility], [place] [address] [place], [place] [address]   . Retic Ct Pct 05/13/2020 2.6  0.4 - 3.1 % Final  . RBC. 05/13/2020 2.90* 3.87 - 5.11 MIL/uL Final  . Retic Count, Absolute 05/13/2020 76.6  19.0 - 186.0 K/uL Final  . Immature Retic Fract 05/13/2020 23.1* 2.3 - 15.9 % Final   Performed at [facility], [address], [place], [place] [address]  . Ferritin 05/13/2020 21  11 - 307 ng/mL Final   Performed at [facility], [address] [place], [place] [address]  . WBC 05/13/2020 9.1  4.0 - 10.5 K/uL Final  . RBC 05/13/2020 2.96* 3.87 - 5.11 MIL/uL Final  . Hemoglobin 05/13/2020 8.6* 12.0 - 15.0 g/dL Final  . HCT 05/13/2020 27.7* 36.0 - 46.0 % Final  . MCV 05/13/2020 93.6  80.0 - 100.0 fL Final  . MCH 05/13/2020 29.1  26.0 - 34.0 pg Final  . MCHC 05/13/2020 31.0  30.0 - 36.0 g/dL Final  . RDW 05/13/2020 13.3  11.5 - 15.5 % Final  . Platelets 05/13/2020 599* 150 - 400 K/uL Final  . nRBC 05/13/2020 0.0  0.0 - 0.2 % Final  . Neutrophils Relative % 05/13/2020 78  % Final  . Neutro Abs 05/13/2020 7.1  1.7 - 7.7 K/uL Final  . Lymphocytes Relative 05/13/2020 11  % Final  . Lymphs Abs 05/13/2020 1.0  0.7 - 4.0 K/uL Final  .  Monocytes Relative 05/13/2020 10  % Final  . Monocytes Absolute 05/13/2020 0.9  0.1 - 1.0 K/uL Final  . Eosinophils Relative 05/13/2020 0  % Final  . Eosinophils Absolute 05/13/2020 0.0  0.0 - 0.5 K/uL Final  . Basophils Relative 05/13/2020 1  % Final  . Basophils Absolute 05/13/2020 0.1  0.0 - 0.1 K/uL Final  . Immature Granulocytes 05/13/2020 0  % Final  . Abs Immature Granulocytes 05/13/2020 0.04  0.00 - 0.07 K/uL Final   Performed at [facility], [address], [place], [place] [address]  . Iron 05/13/2020 16* 28 - 170 ug/dL Final  . TIBC 05/13/2020 319  250 - 450 ug/dL Final  . Saturation Ratios 05/13/2020 5* 10.4 - 31.8 % Final  . UIBC 05/13/2020 303  ug/dL Final   Performed at [facility], [address], [place], [place] [address]  . Sed Rate 05/13/2020 73* 0 - 30 mm/hr Final   Performed at [facility], [address], [place],  [address]    Assessment:  Lynn Rosario is a 87 y.o. female with anemia of stage 3b chronic kidney disease and iron deficiency.  Diet appears good.  She has taken oral iron once daily for years but does not take  it with vitamin C. She takes Synthroid for hypothyroidism.  CBC on 04/24/2020 revealed a hematocrit 27.0, hemoglobin 8.4, MCV 96.1, platelets 542,000, WBC 8,600.   She received Epogen 40,000 units on 03/04/2015 and 03/10/2015.  Creatinine was 1.41 on 04/24/2020  She has a history of iron deficiency anemia.  She received Feraheme on 03/05/2015 and 03/11/2015.  She has not had a colonoscopy in 15-20 years.  The patient does not plan to receive the COVID-19 vaccine. She had a bad reaction to the flu vaccine 10 years ago.  Symptomatically, ***  Plan: 1.   Labs today: review of work-up   2.   Iron deficiency anemia  Patient has been on oral iron for years.   Encourage her to take iron with OJ or vitamin C.  She denies any bleeding.  Last colonoscopy was 15-20 years ago.  Discuss assessing iron  stores.  Preauth Venofer. 3.   Anemia of stage 3b chronic kidney disease   Renal function panel on 04/24/2020 revealed a creatinine of 1.41, calcium 8.7, albumin 3.1.  GFR was estimated at 43 ml/minute.  Hemoglobin has dropped from 11.4 to 8.4.  Discuss additional work-up.  Preauth Retacrit. 4.   Patient requesting refill for Xanax  RN:  Please contact Toni Arthurs, NP re: Xanax. 5.   RTC in 1 week for MD assessment, review of labs, discussion regarding direction of therapy, and +/- Retacrit.  I discussed the assessment and treatment plan with the patient.  The patient was provided an opportunity to ask questions and all were answered.  The patient agreed with the plan and demonstrated an understanding of the instructions.  The patient was advised to call back if the symptoms worsen or if the condition fails to improve as anticipated.  I provided *** minutes of face-to-face time during this this encounter and > 50% was spent counseling as documented under my assessment and plan.  Ming Mcmannis C. Mike Gip, MD, PhD    05/19/2020, 3:15 PM  I, Lequita Asal, am acting as Education administrator for [employer]. Mike Gip, MD, PhD.  I, Louvenia Golomb C. Mike Gip, MD, have reviewed the above documentation for accuracy and completeness, and I agree with the above.

## 2020-05-20 ENCOUNTER — Ambulatory Visit: Payer: Medicare Other | Admitting: Hematology and Oncology

## 2020-05-20 ENCOUNTER — Ambulatory Visit: Payer: Medicare Other

## 2020-05-27 ENCOUNTER — Inpatient Hospital Stay: Payer: Medicare Other

## 2020-05-27 ENCOUNTER — Inpatient Hospital Stay: Payer: Medicare Other | Admitting: Hematology and Oncology

## 2020-06-09 NOTE — Progress Notes (Incomplete)
[facility]  [address], [address] [place], [place] [address] Phone: [phone]  Fax: [phone]   Clinic Day:  06/09/2020  Referring physician: Sherrin Daisy, MD  Chief Complaint: Lynn Rosario is a 87 y.o. female with anemia of stage 3b chronic kidney disease and iron deficiency who is seen for review of work-up and discussion regarding direction of therapy.   HPI: The patient was last seen in the hematology clinic on 05/13/2020 for new patient assessment. At that time, she felt "good". She reported shortness of breath on exertion, leg swelling, easy bruising, rare diarrhea, arthritis in her knees and back, and poor balance. She was wearing a back brace at all times and took hydrocodone daily for back pain. She denied fevers, sweats, ice pica or any other cravings, nausea, vomiting, or bleeding (melena, hematochezia, hematuria or vaginal bleeding).  Work-up revealed a hematocrit of 27.7, hemoglobin 8.6, MCV 93.6, platelets 599,000, WBC 9,100. Ferritin was 21 with an iron saturation of 5% and a TIBC of 319. Reticulocyte count was 2.6%. Sed rate was 73. TSH was 2.675. Vitamin B12 was 4,578, folate 70.7. SPEP showed no M spike. Kappa free light chains were 35.1, lambda free light chains 30.6, ratio 1.15.  The patient saw Dr. Holley Raring on 05/20/2020. (cannot access?).  During the interim, ***    Past Medical History:  Diagnosis Date  . Anemia   . Hypertension   . Hypothyroidism   . Insomnia   . Lumbar radiculitis   . Lumbar spondylosis   . Lumbar stenosis with neurogenic claudication   . Stage III chronic kidney disease [facility])     Past Surgical History:  Procedure Laterality Date  . ABDOMINAL HYSTERECTOMY    . APPENDECTOMY    . TOTAL KNEE ARTHROPLASTY Left 2002    Family History  Problem Relation Age of Onset  . Stroke Mother   . CAD Father   . Stomach cancer Sister   . Breast cancer Sister   . Breast cancer Sister   . Breast cancer  Daughter   The patient denies a family history of blood disorders or kidney diseases.   Social History:  reports that she has never smoked. She has never used smokeless tobacco. She reports that she does not drink alcohol and does not use drugs. She does not use drugs or drink alcohol. She denies any exposure to radiation or toxins. She has not worked in 15-17 years. She worked in a [employer], [employer]. The patient lives in [place]. She lives three doors down from her daughter and POA, [name]. The patient is accompanied by [name]*** today.  Allergies:  Allergies  Allergen Reactions  . Celecoxib Other (See Comments)    Decreased kidney function   . Clarithromycin Other (See Comments)    Loss of taste     Current Medications: Current Outpatient Medications  Medication Sig Dispense Refill  . acetaminophen (TYLENOL) 325 MG tablet Take 650 mg by mouth daily after lunch.    . ALPRAZolam (XANAX) 0.25 MG tablet TAKE (1) TABLET BY MOUTH TWICE DAILY AS NEEDED FOR SLEEP/ANXIETY    . amLODipine (NORVASC) 2.5 MG tablet TAKE (1) TABLET BY MOUTH EVERY DAY    . aspirin EC 81 MG tablet Take 81 mg by mouth daily.     . Cetirizine HCl 10 MG CAPS Take 10 mg by mouth once.     . Cyanocobalamin 2500 MCG SUBL Place 1 tablet under the tongue daily.     Marland Kitchen  Cyanocobalamin 2500 MCG SUBL Place under the tongue.    . diphenhydramine-acetaminophen (TYLENOL PM) 25-500 MG TABS tablet Take by mouth.    . ferrous sulfate 325 (65 FE) MG EC tablet Take 325 mg by mouth 2 (two) times daily.     . ferrous sulfate 325 (65 FE) MG EC tablet Take by mouth.    . furosemide (LASIX) 20 MG tablet Take by mouth.    Marland Kitchen HYDROcodone-acetaminophen (NORCO/VICODIN) 5-325 MG per tablet 1 tablet twice daily    . levothyroxine (SYNTHROID) 75 MCG tablet TAKE 1 TABLET ON AN EMPTY STOMACH WITH A GLASS OF WATER AT LEAST 30 TO 60 MINUTES BEFORE BREAKFAST.    Marland Kitchen levothyroxine (SYNTHROID, LEVOTHROID) 75 MCG tablet Take 75 mcg by mouth daily  before breakfast.    . lisinopril (PRINIVIL,ZESTRIL) 5 MG tablet Take 5 mg by mouth daily.     . Multiple Vitamin (MULTI-VITAMINS) TABS Take 1 tablet by mouth daily.     . sodium bicarbonate 650 MG tablet Take 650 mg by mouth 2 (two) times daily.  (Patient not taking: Reported on 05/13/2020)    . traZODone (DESYREL) 50 MG tablet Take 50 mg by mouth at bedtime as needed for sleep.      No current facility-administered medications for this visit.    Review of Systems  Constitutional: Negative for chills, diaphoresis, fever, malaise/fatigue and weight loss.  HENT: Negative for congestion, ear discharge, ear pain, hearing loss, nosebleeds, sinus pain, sore throat and tinnitus.   Eyes: Negative for blurred vision.  Respiratory: Positive for shortness of breath (on exertion). Negative for cough, hemoptysis and sputum production.   Cardiovascular: Positive for leg swelling. Negative for chest pain and palpitations.  Gastrointestinal: Positive for diarrhea (rare). Negative for abdominal pain, blood in stool, constipation, heartburn, melena, nausea and vomiting.  Genitourinary: Positive for dysuria (burning and itching, on and off x5-6 months). Negative for frequency, hematuria and urgency.  Musculoskeletal: Positive for back pain (due to arthritis and scoliosis, wears a brace) and joint pain (knees). Negative for myalgias and neck pain.  Skin: Negative for itching and rash.  Neurological: Positive for headaches (due to allergies). Negative for dizziness, tingling, sensory change and weakness.       Poor balance.  Endo/Heme/Allergies: Positive for environmental allergies. Bruises/bleeds easily (upper extremities).       Stays cold  Psychiatric/Behavioral: Negative for depression and memory loss. The patient is nervous/anxious (takes Xanax at night). The patient does not have insomnia.   All other systems reviewed and are negative.  Performance status (ECOG): 1***  Vitals There were no vitals taken  for this visit.   Physical Exam Vitals and nursing note reviewed.  Constitutional:      General: She is not in acute distress.    Appearance: She is not diaphoretic.  HENT:     Head: Normocephalic and atraumatic.     Comments: Short gray hair.    Mouth/Throat:     Mouth: Mucous membranes are moist.     Pharynx: Oropharynx is clear.  Eyes:     General: No scleral icterus.    Extraocular Movements: Extraocular movements intact.     Conjunctiva/sclera: Conjunctivae normal.     Pupils: Pupils are equal, round, and reactive to light.     Comments: Glasses.  Blue eyes.  Cardiovascular:     Rate and Rhythm: Normal rate and regular rhythm.     Heart sounds: Normal heart sounds. No murmur heard.   Pulmonary:  Effort: Pulmonary effort is normal. No respiratory distress.     Breath sounds: Normal breath sounds. No wheezing or rales.  Chest:     Chest wall: No tenderness.  Breasts:     Right: No axillary adenopathy or supraclavicular adenopathy.     Left: No axillary adenopathy or supraclavicular adenopathy.    Abdominal:     General: Bowel sounds are normal. There is no distension.     Palpations: Abdomen is soft. There is no mass.     Tenderness: There is abdominal tenderness (left side with palpation). There is no guarding or rebound.  Musculoskeletal:        General: Tenderness (BLE) present. No swelling. Normal range of motion.     Cervical back: Normal range of motion and neck supple.     Right lower leg: Edema (3+) present.     Left lower leg: Edema (3+, L>R) present.     Comments: Back brace in place- removed for exam.  Lymphadenopathy:     Head:     Right side of head: No preauricular, posterior auricular or occipital adenopathy.     Left side of head: No preauricular, posterior auricular or occipital adenopathy.     Cervical: No cervical adenopathy.     Upper Body:     Right upper body: No supraclavicular or axillary adenopathy.     Left upper body: No  supraclavicular or axillary adenopathy.     Lower Body: No right inguinal adenopathy. No left inguinal adenopathy.  Skin:    General: Skin is warm and dry.  Neurological:     Mental Status: She is alert and oriented to person, place, and time.  Psychiatric:        Behavior: Behavior normal.        Thought Content: Thought content normal.        Judgment: Judgment normal.    No visits with results within 3 Day(s) from this visit.  Latest known visit with results is:  Appointment on 05/13/2020  Component Date Value Ref Range Status  . IgG (Immunoglobin G), Serum 05/13/2020 546* 586 - 1,602 mg/dL Final  . IgA 05/13/2020 57* 64 - 422 mg/dL Final  . IgM (Immunoglobulin M), Srm 05/13/2020 85  26 - 217 mg/dL Final  . Total Protein ELP 05/13/2020 6.1  6.0 - 8.5 g/dL Corrected  . Albumin SerPl Elph-Mcnc 05/13/2020 3.0  2.9 - 4.4 g/dL Corrected  . Alpha 1 05/13/2020 0.5* 0.0 - 0.4 g/dL Corrected  . Alpha2 Glob SerPl Elph-Mcnc 05/13/2020 1.2* 0.4 - 1.0 g/dL Corrected  . B-Globulin SerPl Elph-Mcnc 05/13/2020 0.9  0.7 - 1.3 g/dL Corrected  . Gamma Glob SerPl Elph-Mcnc 05/13/2020 0.6  0.4 - 1.8 g/dL Corrected  . M Protein SerPl Elph-Mcnc 05/13/2020 Not Observed  Not Observed g/dL Corrected  . Globulin, Total 05/13/2020 3.1  2.2 - 3.9 g/dL Corrected  . Albumin/Glob SerPl 05/13/2020 1.0  0.7 - 1.7 Corrected  . IFE 1 05/13/2020 Comment   Corrected   Comment: (NOTE) The immunofixation pattern appears unremarkable. Evidence of monoclonal protein is not apparent.   . Please Note 05/13/2020 Comment   Corrected   Comment: (NOTE) Protein electrophoresis scan will follow via computer, mail, or courier delivery. Performed At: [facility] [place], [place] [id number] Rush Farmer MD [id number]   . TSH 05/13/2020 2.675  0.350 - 4.500 uIU/mL Final   Comment: Performed by a 3rd Generation assay with a functional sensitivity of <=0.01 uIU/mL. Performed at [facility], [address], [place], [place] [address]   . Kappa free light chain 05/13/2020 35.1* 3.3 - 19.4 mg/L Final  . Lamda free light chains 05/13/2020 30.6* 5.7 - 26.3 mg/L Final  . Kappa, lamda light chain ratio 05/13/2020 1.15  0.26 - 1.65 Final   Comment: (NOTE) Performed At: [facility] [place], [place] [id number] Rush Farmer MD [id number]   . Folate 05/13/2020 70.7  >5.9 ng/mL Corrected   Comment: RESULTS CONFIRMED BY MANUAL DILUTION Performed at [facility], [address] [place], [place] [address] CORRECTED ON 01/05 AT 0105: PREVIOUSLY REPORTED AS >47.0   . Vitamin B-12 05/13/2020 4,578* 180 - 914 pg/mL Final   Comment: (NOTE) This assay is not validated for testing neonatal or myeloproliferative syndrome specimens for Vitamin B12 levels. Performed at [facility], [place] [address], [place], [place] [address]   . Retic Ct Pct 05/13/2020 2.6  0.4 - 3.1 % Final  . RBC. 05/13/2020 2.90* 3.87 - 5.11 MIL/uL Final  . Retic Count, Absolute 05/13/2020 76.6  19.0 - 186.0 K/uL Final  . Immature Retic Fract 05/13/2020 23.1* 2.3 - 15.9 % Final   Performed at [facility], [address] [place], [place] [address]  . Ferritin 05/13/2020 21  11 - 307 ng/mL Final   Performed at [facility], [place] [place], [place] [address]  . WBC 05/13/2020 9.1  4.0 - 10.5 K/uL Final  . RBC 05/13/2020 2.96* 3.87 - 5.11 MIL/uL Final  . Hemoglobin 05/13/2020 8.6* 12.0 - 15.0 g/dL Final  . HCT 05/13/2020 27.7* 36.0 - 46.0 % Final  . MCV 05/13/2020 93.6  80.0 - 100.0 fL Final  . MCH 05/13/2020 29.1  26.0 - 34.0 pg Final  . MCHC 05/13/2020 31.0  30.0 - 36.0 g/dL Final  . RDW 05/13/2020 13.3  11.5 - 15.5 % Final  . Platelets 05/13/2020 599* 150 - 400 K/uL Final  . nRBC 05/13/2020 0.0  0.0 - 0.2 % Final  . Neutrophils Relative % 05/13/2020 78  % Final  . Neutro Abs 05/13/2020 7.1  1.7 - 7.7 K/uL Final  . Lymphocytes Relative  05/13/2020 11  % Final  . Lymphs Abs 05/13/2020 1.0  0.7 - 4.0 K/uL Final  . Monocytes Relative 05/13/2020 10  % Final  . Monocytes Absolute 05/13/2020 0.9  0.1 - 1.0 K/uL Final  . Eosinophils Relative 05/13/2020 0  % Final  . Eosinophils Absolute 05/13/2020 0.0  0.0 - 0.5 K/uL Final  . Basophils Relative 05/13/2020 1  % Final  . Basophils Absolute 05/13/2020 0.1  0.0 - 0.1 K/uL Final  . Immature Granulocytes 05/13/2020 0  % Final  . Abs Immature Granulocytes 05/13/2020 0.04  0.00 - 0.07 K/uL Final   Performed at [facility], [address] [place], [place] [address]  . Iron 05/13/2020 16* 28 - 170 ug/dL Final  . TIBC 05/13/2020 319  250 - 450 ug/dL Final  . Saturation Ratios 05/13/2020 5* 10.4 - 31.8 % Final  . UIBC 05/13/2020 303  ug/dL Final   Performed at [facility], [address] [place], [place] [address]  . Sed Rate 05/13/2020 73* 0 - 30 mm/hr Final   Performed at [facility], [address] [place], [place] [address]    Assessment:  Carlicia Leavens is a 87 y.o. female with anemia of stage 3b chronic kidney disease and iron deficiency.  Diet appears good.  She has  taken oral iron once daily for years but does not take it with vitamin C. She takes Synthroid for hypothyroidism.  CBC on 04/24/2020 revealed a hematocrit 27.0, hemoglobin 8.4, MCV 96.1, platelets 542,000, WBC 8,600.   She received Epogen 40,000 units on 03/04/2015 and 03/10/2015.  Creatinine was 1.41 on 04/24/2020  She has a history of iron deficiency anemia.  She received Feraheme on 03/05/2015 and 03/11/2015.  She has not had a colonoscopy in 15-20 years.  The patient does not plan to receive the COVID-19 vaccine. She had a bad reaction to the flu vaccine 10 years ago.  Symptomatically, ***  Plan: 1.   Review work-up   2.   Iron deficiency anemia  Patient has been on oral iron for years.   Encourage her to take iron with OJ or vitamin C.  She denies any  bleeding.  Last colonoscopy was 15-20 years ago.  Discuss assessing iron stores.  Preauth Venofer. 3.   Anemia of stage 3b chronic kidney disease   Renal function panel on 04/24/2020 revealed a creatinine of 1.41, calcium 8.7, albumin 3.1.  GFR was estimated at 43 ml/minute.  Hemoglobin has dropped from 11.4 to 8.4.  Discuss additional work-up.  Preauth Retacrit. 4.   Patient requesting refill for Xanax  RN:  Please contact Toni Arthurs, NP re: Xanax. 5.   RTC in 1 week for MD assessment, review of labs, discussion regarding direction of therapy, and +/- Retacrit.  I discussed the assessment and treatment plan with the patient.  The patient was provided an opportunity to ask questions and all were answered.  The patient agreed with the plan and demonstrated an understanding of the instructions.  The patient was advised to call back if the symptoms worsen or if the condition fails to improve as anticipated.  I provided *** minutes of face-to-face time during this this encounter and > 50% was spent counseling as documented under my assessment and plan.  Melissa C. Mike Gip, MD, PhD    06/09/2020, 1:37 PM  I, Mirian Mo Tufford, am acting as Education administrator for [employer]. Mike Gip, MD, PhD.  I, Melissa C. Mike Gip, MD, have reviewed the above documentation for accuracy and completeness, and I agree with the above.

## 2020-06-10 ENCOUNTER — Ambulatory Visit: Payer: Medicare Other | Admitting: Hematology and Oncology

## 2020-06-10 ENCOUNTER — Ambulatory Visit: Payer: Medicare Other

## 2020-06-10 ENCOUNTER — Other Ambulatory Visit: Payer: Self-pay | Admitting: Hematology and Oncology

## 2020-06-17 NOTE — Progress Notes (Incomplete)
[facility]  [address] [place], [place] [address] Phone: [phone]  Fax: [phone]   Clinic Day:  06/17/2020  Referring physician: Sherrin Daisy, MD  Chief Complaint: Lynn Rosario is a 87 y.o. female with anemia of stage 3b chronic kidney disease and iron deficiency who is seen for review of work-up and discussion regarding direction of therapy.   HPI: The patient was last seen in the hematology clinic on 05/13/2020 for new patient assessment. At that time, she felt "good". She reported shortness of breath on exertion, leg swelling, easy bruising, rare diarrhea, arthritis in her knees and back, and poor balance. She was wearing a back brace at all times and took hydrocodone daily for back pain. She denied fevers, sweats, ice pica or any other cravings, nausea, vomiting, or bleeding.  She noted being on oral iron for years.  Work-up revealed a hematocrit of 27.7, hemoglobin 8.6, MCV 93.6, platelets 599,000, WBC 9,100 with an ANC of 7100.  Ferritin was 21 (low) with an iron saturation of 5% and a TIBC of 319. Reticulocyte count was 2.6%. Sed rate was 73. TSH was 2.675. Vitamin B12 was 4,578 (high) and folate 70.7 (high). SPEP showed no M spike. Kappa free light chains were 35.1, lambda free light chains 30.6, and ratio 1.15 (normal).  She was contacted.  B12 1000 mcg a day was discontinued on 05/15/2020.    She saw Dr. Holley Raring on 05/20/2020. She had dysuria.  Most recent urine culture revealed streptococcus viridans; she had E coli in the past.  She was empirically treated with Augmentin.  She was to wear compression stocking for her lower extremity edema.  During the interim, ***    Past Medical History:  Diagnosis Date  . Anemia   . Hypertension   . Hypothyroidism   . Insomnia   . Lumbar radiculitis   . Lumbar spondylosis   . Lumbar stenosis with neurogenic claudication   . Stage III chronic kidney disease [facility])     Past Surgical  History:  Procedure Laterality Date  . ABDOMINAL HYSTERECTOMY    . APPENDECTOMY    . TOTAL KNEE ARTHROPLASTY Left 2002    Family History  Problem Relation Age of Onset  . Stroke Mother   . CAD Father   . Stomach cancer Sister   . Breast cancer Sister   . Breast cancer Sister   . Breast cancer Daughter   The patient denies a family history of blood disorders or kidney diseases.   Social History:  reports that she has never smoked. She has never used smokeless tobacco. She reports that she does not drink alcohol and does not use drugs. She does not use drugs or drink alcohol. She denies any exposure to radiation or toxins. She has not worked in 15-17 years. She worked in a [employer], [employer]. The patient lives in [place]. She lives three doors down from her daughter and POA, [name]. The patient is accompanied by [name]*** today.  Allergies:  Allergies  Allergen Reactions  . Celecoxib Other (See Comments)    Decreased kidney function   . Clarithromycin Other (See Comments)    Loss of taste     Current Medications: Current Outpatient Medications  Medication Sig Dispense Refill  . acetaminophen (TYLENOL) 325 MG tablet Take 650 mg by mouth daily after lunch.    . ALPRAZolam (XANAX) 0.25 MG tablet TAKE (1) TABLET BY MOUTH TWICE DAILY AS NEEDED FOR SLEEP/ANXIETY    .  amLODipine (NORVASC) 2.5 MG tablet TAKE (1) TABLET BY MOUTH EVERY DAY    . aspirin EC 81 MG tablet Take 81 mg by mouth daily.     . Cetirizine HCl 10 MG CAPS Take 10 mg by mouth once.     . Cyanocobalamin 2500 MCG SUBL Place 1 tablet under the tongue daily.     . Cyanocobalamin 2500 MCG SUBL Place under the tongue.    . diphenhydramine-acetaminophen (TYLENOL PM) 25-500 MG TABS tablet Take by mouth.    . ferrous sulfate 325 (65 FE) MG EC tablet Take 325 mg by mouth 2 (two) times daily.     . ferrous sulfate 325 (65 FE) MG EC tablet Take by mouth.    . furosemide (LASIX) 20 MG tablet Take by mouth.    Marland Kitchen  HYDROcodone-acetaminophen (NORCO/VICODIN) 5-325 MG per tablet 1 tablet twice daily    . levothyroxine (SYNTHROID) 75 MCG tablet TAKE 1 TABLET ON AN EMPTY STOMACH WITH A GLASS OF WATER AT LEAST 30 TO 60 MINUTES BEFORE BREAKFAST.    Marland Kitchen levothyroxine (SYNTHROID, LEVOTHROID) 75 MCG tablet Take 75 mcg by mouth daily before breakfast.    . lisinopril (PRINIVIL,ZESTRIL) 5 MG tablet Take 5 mg by mouth daily.     . Multiple Vitamin (MULTI-VITAMINS) TABS Take 1 tablet by mouth daily.     . sodium bicarbonate 650 MG tablet Take 650 mg by mouth 2 (two) times daily.  (Patient not taking: Reported on 05/13/2020)    . traZODone (DESYREL) 50 MG tablet Take 50 mg by mouth at bedtime as needed for sleep.      No current facility-administered medications for this visit.    Review of Systems  Constitutional: Negative for chills, diaphoresis, fever, malaise/fatigue and weight loss.  HENT: Negative for congestion, ear discharge, ear pain, hearing loss, nosebleeds, sinus pain, sore throat and tinnitus.   Eyes: Negative for blurred vision.  Respiratory: Positive for shortness of breath (on exertion). Negative for cough, hemoptysis and sputum production.   Cardiovascular: Positive for leg swelling. Negative for chest pain and palpitations.  Gastrointestinal: Positive for diarrhea (rare). Negative for abdominal pain, blood in stool, constipation, heartburn, melena, nausea and vomiting.  Genitourinary: Positive for dysuria (burning and itching, on and off x5-6 months). Negative for frequency, hematuria and urgency.  Musculoskeletal: Positive for back pain (due to arthritis and scoliosis, wears a brace) and joint pain (knees). Negative for myalgias and neck pain.  Skin: Negative for itching and rash.  Neurological: Positive for headaches (due to allergies). Negative for dizziness, tingling, sensory change and weakness.       Poor balance.  Endo/Heme/Allergies: Positive for environmental allergies. Bruises/bleeds easily  (upper extremities).       Stays cold  Psychiatric/Behavioral: Negative for depression and memory loss. The patient is nervous/anxious (takes Xanax at night). The patient does not have insomnia.   All other systems reviewed and are negative.  Performance status (ECOG): 1***  Vitals There were no vitals taken for this visit.   Physical Exam Vitals and nursing note reviewed.  Constitutional:      General: She is not in acute distress.    Appearance: She is not diaphoretic.  HENT:     Head: Normocephalic and atraumatic.     Comments: Short gray hair.    Mouth/Throat:     Mouth: Mucous membranes are moist.     Pharynx: Oropharynx is clear.  Eyes:     General: No scleral icterus.    Extraocular Movements: Extraocular  movements intact.     Conjunctiva/sclera: Conjunctivae normal.     Pupils: Pupils are equal, round, and reactive to light.     Comments: Glasses.  Blue eyes.  Cardiovascular:     Rate and Rhythm: Normal rate and regular rhythm.     Heart sounds: Normal heart sounds. No murmur heard.   Pulmonary:     Effort: Pulmonary effort is normal. No respiratory distress.     Breath sounds: Normal breath sounds. No wheezing or rales.  Chest:     Chest wall: No tenderness.  Breasts:     Right: No axillary adenopathy or supraclavicular adenopathy.     Left: No axillary adenopathy or supraclavicular adenopathy.    Abdominal:     General: Bowel sounds are normal. There is no distension.     Palpations: Abdomen is soft. There is no mass.     Tenderness: There is abdominal tenderness (left side with palpation). There is no guarding or rebound.  Musculoskeletal:        General: Tenderness (BLE) present. No swelling. Normal range of motion.     Cervical back: Normal range of motion and neck supple.     Right lower leg: Edema (3+) present.     Left lower leg: Edema (3+, L>R) present.     Comments: Back brace in place- removed for exam.  Lymphadenopathy:     Head:     Right  side of head: No preauricular, posterior auricular or occipital adenopathy.     Left side of head: No preauricular, posterior auricular or occipital adenopathy.     Cervical: No cervical adenopathy.     Upper Body:     Right upper body: No supraclavicular or axillary adenopathy.     Left upper body: No supraclavicular or axillary adenopathy.     Lower Body: No right inguinal adenopathy. No left inguinal adenopathy.  Skin:    General: Skin is warm and dry.  Neurological:     Mental Status: She is alert and oriented to person, place, and time.  Psychiatric:        Behavior: Behavior normal.        Thought Content: Thought content normal.        Judgment: Judgment normal.    No visits with results within 3 Day(s) from this visit.  Latest known visit with results is:  Appointment on 05/13/2020  Component Date Value Ref Range Status  . IgG (Immunoglobin G), Serum 05/13/2020 546* 586 - 1,602 mg/dL Final  . IgA 05/13/2020 57* 64 - 422 mg/dL Final  . IgM (Immunoglobulin M), Srm 05/13/2020 85  26 - 217 mg/dL Final  . Total Protein ELP 05/13/2020 6.1  6.0 - 8.5 g/dL Corrected  . Albumin SerPl Elph-Mcnc 05/13/2020 3.0  2.9 - 4.4 g/dL Corrected  . Alpha 1 05/13/2020 0.5* 0.0 - 0.4 g/dL Corrected  . Alpha2 Glob SerPl Elph-Mcnc 05/13/2020 1.2* 0.4 - 1.0 g/dL Corrected  . B-Globulin SerPl Elph-Mcnc 05/13/2020 0.9  0.7 - 1.3 g/dL Corrected  . Gamma Glob SerPl Elph-Mcnc 05/13/2020 0.6  0.4 - 1.8 g/dL Corrected  . M Protein SerPl Elph-Mcnc 05/13/2020 Not Observed  Not Observed g/dL Corrected  . Globulin, Total 05/13/2020 3.1  2.2 - 3.9 g/dL Corrected  . Albumin/Glob SerPl 05/13/2020 1.0  0.7 - 1.7 Corrected  . IFE 1 05/13/2020 Comment   Corrected   Comment: (NOTE) The immunofixation pattern appears unremarkable. Evidence of monoclonal protein is not apparent.   . Please Note 05/13/2020 Comment  Corrected   Comment: (NOTE) Protein electrophoresis scan will follow via computer, mail, or courier  delivery. Performed At: [facility] [place], [place] [id number] Rush Farmer MD [id number]   . TSH 05/13/2020 2.675  0.350 - 4.500 uIU/mL Final   Comment: Performed by a 3rd Generation assay with a functional sensitivity of <=0.01 uIU/mL. Performed at [facility], [address] [place], [place] [address]   . Kappa free light chain 05/13/2020 35.1* 3.3 - 19.4 mg/L Final  . Lamda free light chains 05/13/2020 30.6* 5.7 - 26.3 mg/L Final  . Kappa, lamda light chain ratio 05/13/2020 1.15  0.26 - 1.65 Final   Comment: (NOTE) Performed At: [facility] [place], [place] [id number] Rush Farmer MD [id number]   . Folate 05/13/2020 70.7  >5.9 ng/mL Corrected   Comment: RESULTS CONFIRMED BY MANUAL DILUTION Performed at [facility], [address] [place], [place] [address] CORRECTED ON 01/05 AT 0105: PREVIOUSLY REPORTED AS >47.0   . Vitamin B-12 05/13/2020 4,578* 180 - 914 pg/mL Final   Comment: (NOTE) This assay is not validated for testing neonatal or myeloproliferative syndrome specimens for Vitamin B12 levels. Performed at [facility], [place] [address], [place], [place] [address]   . Retic Ct Pct 05/13/2020 2.6  0.4 - 3.1 % Final  . RBC. 05/13/2020 2.90* 3.87 - 5.11 MIL/uL Final  . Retic Count, Absolute 05/13/2020 76.6  19.0 - 186.0 K/uL Final  . Immature Retic Fract 05/13/2020 23.1* 2.3 - 15.9 % Final   Performed at [facility], [address], [place], [place] [address]  . Ferritin 05/13/2020 21  11 - 307 ng/mL Final   Performed at [facility], [address] [place], [place] [address]  . WBC 05/13/2020 9.1  4.0 - 10.5 K/uL Final  . RBC 05/13/2020 2.96* 3.87 - 5.11 MIL/uL Final  . Hemoglobin 05/13/2020 8.6* 12.0 - 15.0 g/dL Final  . HCT 05/13/2020 27.7* 36.0 - 46.0 % Final  . MCV 05/13/2020 93.6  80.0 - 100.0 fL Final  . MCH 05/13/2020 29.1  26.0 - 34.0 pg Final  . MCHC  05/13/2020 31.0  30.0 - 36.0 g/dL Final  . RDW 05/13/2020 13.3  11.5 - 15.5 % Final  . Platelets 05/13/2020 599* 150 - 400 K/uL Final  . nRBC 05/13/2020 0.0  0.0 - 0.2 % Final  . Neutrophils Relative % 05/13/2020 78  % Final  . Neutro Abs 05/13/2020 7.1  1.7 - 7.7 K/uL Final  . Lymphocytes Relative 05/13/2020 11  % Final  . Lymphs Abs 05/13/2020 1.0  0.7 - 4.0 K/uL Final  . Monocytes Relative 05/13/2020 10  % Final  . Monocytes Absolute 05/13/2020 0.9  0.1 - 1.0 K/uL Final  . Eosinophils Relative 05/13/2020 0  % Final  . Eosinophils Absolute 05/13/2020 0.0  0.0 - 0.5 K/uL Final  . Basophils Relative 05/13/2020 1  % Final  . Basophils Absolute 05/13/2020 0.1  0.0 - 0.1 K/uL Final  . Immature Granulocytes 05/13/2020 0  % Final  . Abs Immature Granulocytes 05/13/2020 0.04  0.00 - 0.07 K/uL Final   Performed at [facility], [address] [place], [place] [address]  . Iron 05/13/2020 16* 28 - 170 ug/dL Final  . TIBC 05/13/2020 319  250 - 450 ug/dL Final  . Saturation Ratios 05/13/2020 5* 10.4 - 31.8 % Final  . UIBC 05/13/2020 303  ug/dL Final   Performed at [facility], [address] [place], [place] [address]  . Sed Rate 05/13/2020 73* 0 - 30 mm/hr Final   Performed at [facility], [address], [place], [place] [address]    Assessment:  Lynn Rosario is a 87 y.o. female with anemia of stage 3b chronic kidney disease and iron deficiency.  Diet appears good.  She has taken oral iron once daily for years but does not take it with vitamin C. She takes Synthroid for hypothyroidism.  CBC on 04/24/2020 revealed a hematocrit 27.0, hemoglobin 8.4, MCV 96.1, platelets 542,000, WBC 8,600.   Work-up on 05/13/2020 revealed a hematocrit of 27.7, hemoglobin 8.6, MCV 93.6, platelets 599,000, WBC 9,100 with an ANC of 7100.  Ferritin was 21 (low) with an iron saturation of 5% and a TIBC of 319. Reticulocyte count was 2.6%. Sed rate was 73. TSH was 2.675.  Vitamin B12 was 4,578 (high) and folate 70.7 (high).  SPEP and FLCA were normal.  She received Epogen 40,000 units on 03/04/2015 and 03/10/2015.  Creatinine was 1.41 on 04/24/2020  She has a history of iron deficiency anemia.  She received Feraheme on 03/05/2015 and 03/11/2015.  She has not had a colonoscopy in 15-20 years.  The patient does not plan to receive the COVID-19 vaccine. She had a bad reaction to the flu vaccine 10 years ago.  Symptomatically, ***  Plan: 1.   Labs today:  CBC.   2.   Iron deficiency anemia  Patient has been on oral iron for years.   Encourage her to take iron with OJ or vitamin C.  She denies any bleeding.  Last colonoscopy was 15-20 years ago.  Discuss assessing iron stores.  Preauth Venofer. 3.   Anemia of stage 3b chronic kidney disease   Renal function panel on 04/24/2020 revealed a creatinine of 1.41, calcium 8.7, albumin 3.1.  GFR was estimated at 43 ml/minute.  Hemoglobin has dropped from 11.4 to 8.4.  Discuss additional work-up.  Preauth Retacrit. 4.   Patient requesting refill for Xanax  RN:  Please contact Toni Arthurs, NP re: Xanax. 5.   RTC in 1 week for MD assessment, review of labs, discussion regarding direction of therapy, and +/- Retacrit.  I discussed the assessment and treatment plan with the patient.  The patient was provided an opportunity to ask questions and all were answered.  The patient agreed with the plan and demonstrated an understanding of the instructions.  The patient was advised to call back if the symptoms worsen or if the condition fails to improve as anticipated.  I provided *** minutes of face-to-face time during this this encounter and > 50% was spent counseling as documented under my assessment and plan.  Melissa C. Mike Gip, MD, PhD    06/17/2020, 8:47 AM  I, Mirian Mo Tufford, am acting as Education administrator for [employer]. Mike Gip, MD, PhD.  I, Melissa C. Mike Gip, MD, have reviewed the above documentation for accuracy  and completeness, and I agree with the above.

## 2020-06-18 ENCOUNTER — Inpatient Hospital Stay: Payer: Medicare Other | Attending: Hematology and Oncology | Admitting: Hematology and Oncology

## 2020-06-18 ENCOUNTER — Other Ambulatory Visit: Payer: Self-pay | Admitting: Hematology and Oncology

## 2020-06-18 ENCOUNTER — Inpatient Hospital Stay: Payer: Medicare Other

## 2020-06-23 NOTE — Progress Notes (Incomplete)
[facility]  [address] [place], [place] [address] Phone: [phone]  Fax: [phone]   Clinic Day:  06/23/2020  Referring physician: Sherrin Daisy, MD  Chief Complaint: Lynn Rosario is a 87 y.o. female with anemia of stage 3b chronic kidney disease and iron deficiency who is seen for review of work-up and discussion regarding direction of therapy.  HPI: The patient was last seen in the hematology clinic on 05/13/2020 for new patient assessment. At that time, she felt "good".  She reported shortness of breath on exertion, leg swelling, easy bruising, rare diarrhea, arthritis in her knees and back, and poor balance. She wore a back brace at all times and took hydrocodone daily for back pain. She denied fevers, sweats, ice pica or any other cravings, nausea, vomiting, or bleeding (melena, hematochezia, hematuria or vaginal bleeding).  Work-up revealed a hematocrit of 27.7, hemoglobin 8.6, MCV 93.6, platelets 599,000, WBC 9,100. Ferritin was 21 with an iron saturation of 5% and a TIBC of 319. Reticulocyte count was 2.6%.  Sed rate was 73. Vitamin B12 was 4.578 and folate 70.7. TSH was 2.675. SPEP showed no M-spike. Kappa free light chains were 35.1, lambda free light chains 30.6, and ratio 1.15 (normal).  During the interim, ***    Past Medical History:  Diagnosis Date  . Anemia   . Hypertension   . Hypothyroidism   . Insomnia   . Lumbar radiculitis   . Lumbar spondylosis   . Lumbar stenosis with neurogenic claudication   . Stage III chronic kidney disease [facility])     Past Surgical History:  Procedure Laterality Date  . ABDOMINAL HYSTERECTOMY    . APPENDECTOMY    . TOTAL KNEE ARTHROPLASTY Left 2002    Family History  Problem Relation Age of Onset  . Stroke Mother   . CAD Father   . Stomach cancer Sister   . Breast cancer Sister   . Breast cancer Sister   . Breast cancer Daughter   The patient denies a family history of blood  disorders or kidney diseases.   Social History:  reports that she has never smoked. She has never used smokeless tobacco. She reports that she does not drink alcohol and does not use drugs. She does not use drugs or drink alcohol. She denies any exposure to radiation or toxins. She has not worked in 15-17 years. She worked in a [employer], [employer]. The patient lives in [place]. She lives three doors down from her daughter and POA, [name]. The patient is accompanied by [name]*** today.  Allergies:  Allergies  Allergen Reactions  . Celecoxib Other (See Comments)    Decreased kidney function   . Clarithromycin Other (See Comments)    Loss of taste     Current Medications: Current Outpatient Medications  Medication Sig Dispense Refill  . acetaminophen (TYLENOL) 325 MG tablet Take 650 mg by mouth daily after lunch.    . ALPRAZolam (XANAX) 0.25 MG tablet TAKE (1) TABLET BY MOUTH TWICE DAILY AS NEEDED FOR SLEEP/ANXIETY    . amLODipine (NORVASC) 2.5 MG tablet TAKE (1) TABLET BY MOUTH EVERY DAY    . aspirin EC 81 MG tablet Take 81 mg by mouth daily.     . Cetirizine HCl 10 MG CAPS Take 10 mg by mouth once.     . Cyanocobalamin 2500 MCG SUBL Place 1 tablet under the tongue daily.     . Cyanocobalamin 2500 MCG SUBL Place under the tongue.    Marland Kitchen  diphenhydramine-acetaminophen (TYLENOL PM) 25-500 MG TABS tablet Take by mouth.    . ferrous sulfate 325 (65 FE) MG EC tablet Take 325 mg by mouth 2 (two) times daily.     . ferrous sulfate 325 (65 FE) MG EC tablet Take by mouth.    . furosemide (LASIX) 20 MG tablet Take by mouth.    Marland Kitchen HYDROcodone-acetaminophen (NORCO/VICODIN) 5-325 MG per tablet 1 tablet twice daily    . levothyroxine (SYNTHROID) 75 MCG tablet TAKE 1 TABLET ON AN EMPTY STOMACH WITH A GLASS OF WATER AT LEAST 30 TO 60 MINUTES BEFORE BREAKFAST.    Marland Kitchen levothyroxine (SYNTHROID, LEVOTHROID) 75 MCG tablet Take 75 mcg by mouth daily before breakfast.    . lisinopril (PRINIVIL,ZESTRIL) 5 MG  tablet Take 5 mg by mouth daily.     . Multiple Vitamin (MULTI-VITAMINS) TABS Take 1 tablet by mouth daily.     . sodium bicarbonate 650 MG tablet Take 650 mg by mouth 2 (two) times daily.  (Patient not taking: Reported on 05/13/2020)    . traZODone (DESYREL) 50 MG tablet Take 50 mg by mouth at bedtime as needed for sleep.      No current facility-administered medications for this visit.    Review of Systems  Constitutional: Negative for chills, diaphoresis, fever, malaise/fatigue and weight loss.  HENT: Negative for congestion, ear discharge, ear pain, hearing loss, nosebleeds, sinus pain, sore throat and tinnitus.   Eyes: Negative for blurred vision.  Respiratory: Positive for shortness of breath (on exertion). Negative for cough, hemoptysis and sputum production.   Cardiovascular: Positive for leg swelling. Negative for chest pain and palpitations.  Gastrointestinal: Positive for diarrhea (rare). Negative for abdominal pain, blood in stool, constipation, heartburn, melena, nausea and vomiting.  Genitourinary: Positive for dysuria (burning and itching, on and off x5-6 months). Negative for frequency, hematuria and urgency.  Musculoskeletal: Positive for back pain (due to arthritis and scoliosis, wears a brace) and joint pain (knees). Negative for myalgias and neck pain.  Skin: Negative for itching and rash.  Neurological: Positive for headaches (due to allergies). Negative for dizziness, tingling, sensory change and weakness.       Poor balance.  Endo/Heme/Allergies: Positive for environmental allergies. Bruises/bleeds easily (upper extremities).       Stays cold  Psychiatric/Behavioral: Negative for depression and memory loss. The patient is nervous/anxious (takes Xanax at night). The patient does not have insomnia.   All other systems reviewed and are negative.  Performance status (ECOG): 1  Vitals There were no vitals taken for this visit.   Physical Exam Vitals and nursing note  reviewed.  Constitutional:      General: She is not in acute distress.    Appearance: She is not diaphoretic.  HENT:     Head: Normocephalic and atraumatic.     Comments: Short gray hair.    Mouth/Throat:     Mouth: Mucous membranes are moist.     Pharynx: Oropharynx is clear.  Eyes:     General: No scleral icterus.    Extraocular Movements: Extraocular movements intact.     Conjunctiva/sclera: Conjunctivae normal.     Pupils: Pupils are equal, round, and reactive to light.     Comments: Glasses.  Blue eyes.  Cardiovascular:     Rate and Rhythm: Normal rate and regular rhythm.     Heart sounds: Normal heart sounds. No murmur heard.   Pulmonary:     Effort: Pulmonary effort is normal. No respiratory distress.  Breath sounds: Normal breath sounds. No wheezing or rales.  Chest:     Chest wall: No tenderness.  Breasts:     Right: No axillary adenopathy or supraclavicular adenopathy.     Left: No axillary adenopathy or supraclavicular adenopathy.    Abdominal:     General: Bowel sounds are normal. There is no distension.     Palpations: Abdomen is soft. There is no mass.     Tenderness: There is abdominal tenderness (left side with palpation). There is no guarding or rebound.  Musculoskeletal:        General: Tenderness (BLE) present. No swelling. Normal range of motion.     Cervical back: Normal range of motion and neck supple.     Right lower leg: Edema (3+) present.     Left lower leg: Edema (3+, L>R) present.     Comments: Back brace in place- removed for exam.  Lymphadenopathy:     Head:     Right side of head: No preauricular, posterior auricular or occipital adenopathy.     Left side of head: No preauricular, posterior auricular or occipital adenopathy.     Cervical: No cervical adenopathy.     Upper Body:     Right upper body: No supraclavicular or axillary adenopathy.     Left upper body: No supraclavicular or axillary adenopathy.     Lower Body: No right  inguinal adenopathy. No left inguinal adenopathy.  Skin:    General: Skin is warm and dry.  Neurological:     Mental Status: She is alert and oriented to person, place, and time.  Psychiatric:        Behavior: Behavior normal.        Thought Content: Thought content normal.        Judgment: Judgment normal.    No visits with results within 3 Day(s) from this visit.  Latest known visit with results is:  Appointment on 05/13/2020  Component Date Value Ref Range Status  . IgG (Immunoglobin G), Serum 05/13/2020 546* 586 - 1,602 mg/dL Final  . IgA 05/13/2020 57* 64 - 422 mg/dL Final  . IgM (Immunoglobulin M), Srm 05/13/2020 85  26 - 217 mg/dL Final  . Total Protein ELP 05/13/2020 6.1  6.0 - 8.5 g/dL Corrected  . Albumin SerPl Elph-Mcnc 05/13/2020 3.0  2.9 - 4.4 g/dL Corrected  . Alpha 1 05/13/2020 0.5* 0.0 - 0.4 g/dL Corrected  . Alpha2 Glob SerPl Elph-Mcnc 05/13/2020 1.2* 0.4 - 1.0 g/dL Corrected  . B-Globulin SerPl Elph-Mcnc 05/13/2020 0.9  0.7 - 1.3 g/dL Corrected  . Gamma Glob SerPl Elph-Mcnc 05/13/2020 0.6  0.4 - 1.8 g/dL Corrected  . M Protein SerPl Elph-Mcnc 05/13/2020 Not Observed  Not Observed g/dL Corrected  . Globulin, Total 05/13/2020 3.1  2.2 - 3.9 g/dL Corrected  . Albumin/Glob SerPl 05/13/2020 1.0  0.7 - 1.7 Corrected  . IFE 1 05/13/2020 Comment   Corrected   Comment: (NOTE) The immunofixation pattern appears unremarkable. Evidence of monoclonal protein is not apparent.   . Please Note 05/13/2020 Comment   Corrected   Comment: (NOTE) Protein electrophoresis scan will follow via computer, mail, or courier delivery. Performed At: [facility] [place], [place] [id number] Rush Farmer MD [id number]   . TSH 05/13/2020 2.675  0.350 - 4.500 uIU/mL Final   Comment: Performed by a 3rd Generation assay with a functional sensitivity of <=0.01 uIU/mL. Performed at [facility], [address], [place], [place] [address]   . Kappa  free  light chain 05/13/2020 35.1* 3.3 - 19.4 mg/L Final  . Lamda free light chains 05/13/2020 30.6* 5.7 - 26.3 mg/L Final  . Kappa, lamda light chain ratio 05/13/2020 1.15  0.26 - 1.65 Final   Comment: (NOTE) Performed At: [facility] [place], [place] [id number] Rush Farmer MD [id number]   . Folate 05/13/2020 70.7  >5.9 ng/mL Corrected   Comment: RESULTS CONFIRMED BY MANUAL DILUTION Performed at [facility], [address] [place], [place] [address] CORRECTED ON 01/05 AT 0105: PREVIOUSLY REPORTED AS >47.0   . Vitamin B-12 05/13/2020 4,578* 180 - 914 pg/mL Final   Comment: (NOTE) This assay is not validated for testing neonatal or myeloproliferative syndrome specimens for Vitamin B12 levels. Performed at [facility], [place] [address] [place], [place] [address]   . Retic Ct Pct 05/13/2020 2.6  0.4 - 3.1 % Final  . RBC. 05/13/2020 2.90* 3.87 - 5.11 MIL/uL Final  . Retic Count, Absolute 05/13/2020 76.6  19.0 - 186.0 K/uL Final  . Immature Retic Fract 05/13/2020 23.1* 2.3 - 15.9 % Final   Performed at [facility], [address], [place], [place] [address]  . Ferritin 05/13/2020 21  11 - 307 ng/mL Final   Performed at [facility], [address] [place], [place] [address]  . WBC 05/13/2020 9.1  4.0 - 10.5 K/uL Final  . RBC 05/13/2020 2.96* 3.87 - 5.11 MIL/uL Final  . Hemoglobin 05/13/2020 8.6* 12.0 - 15.0 g/dL Final  . HCT 05/13/2020 27.7* 36.0 - 46.0 % Final  . MCV 05/13/2020 93.6  80.0 - 100.0 fL Final  . MCH 05/13/2020 29.1  26.0 - 34.0 pg Final  . MCHC 05/13/2020 31.0  30.0 - 36.0 g/dL Final  . RDW 05/13/2020 13.3  11.5 - 15.5 % Final  . Platelets 05/13/2020 599* 150 - 400 K/uL Final  . nRBC 05/13/2020 0.0  0.0 - 0.2 % Final  . Neutrophils Relative % 05/13/2020 78  % Final  . Neutro Abs 05/13/2020 7.1  1.7 - 7.7 K/uL Final  . Lymphocytes Relative 05/13/2020 11  % Final  . Lymphs Abs 05/13/2020 1.0  0.7 - 4.0 K/uL  Final  . Monocytes Relative 05/13/2020 10  % Final  . Monocytes Absolute 05/13/2020 0.9  0.1 - 1.0 K/uL Final  . Eosinophils Relative 05/13/2020 0  % Final  . Eosinophils Absolute 05/13/2020 0.0  0.0 - 0.5 K/uL Final  . Basophils Relative 05/13/2020 1  % Final  . Basophils Absolute 05/13/2020 0.1  0.0 - 0.1 K/uL Final  . Immature Granulocytes 05/13/2020 0  % Final  . Abs Immature Granulocytes 05/13/2020 0.04  0.00 - 0.07 K/uL Final   Performed at [facility], [address] [place],  [address]  . Iron 05/13/2020 16* 28 - 170 ug/dL Final  . TIBC 05/13/2020 319  250 - 450 ug/dL Final  . Saturation Ratios 05/13/2020 5* 10.4 - 31.8 % Final  . UIBC 05/13/2020 303  ug/dL Final   Performed at [facility], [address], [place],  [address]  . Sed Rate 05/13/2020 73* 0 - 30 mm/hr Final   Performed at [facility], [address] [place],  [address]    Assessment:  Camya Haydon is a 87 y.o. female with anemia of stage 3b chronic kidney disease and iron deficiency.  Diet appears good.  She has taken oral iron once daily for years but does not take  it with vitamin C. She takes Synthroid for hypothyroidism.  CBC on 04/24/2020 revealed a hematocrit 27.0, hemoglobin 8.4, MCV 96.1, platelets 542,000, WBC 8,600.   Work-up on 05/13/2020 revealed a hematocrit of 27.7, hemoglobin 8.6, MCV 93.6, platelets 599,000, WBC 9,100. Ferritin was 21 with an iron saturation of 5% and a TIBC of 319. Reticulocyte count was 2.6%.  Sed rate was 73. Vitamin B12 was 4.578 and folate 70.7. TSH was 2.675. SPEP showed no M-spike. Kappa free light chains were 35.1, lambda free light chains 30.6, and ratio 1.15 (normal).  She received Epogen 40,000 units on 03/04/2015 and 03/10/2015.  Creatinine was 1.41 on 04/24/2020  She has iron deficiency anemia.  She received Feraheme on 03/05/2015 and 03/11/2015.  She has taken oral iron for years.  She has not had a  colonoscopy in 15-20 years.    The patient does not plan to receive the COVID-19 vaccine. She had a bad reaction to the flu vaccine 10 years ago.  Symptomatically, ***  Plan: 1.   Labs today:  CBC, ferritin.   2.   Iron deficiency anemia  Patient has been on oral iron for years.   Encourage her to take iron with OJ or vitamin C.  She denies any bleeding.  Last colonoscopy was 15-20 years ago.  Discuss assessing iron stores.  Preauth Venofer. 3.   Anemia of stage 3b chronic kidney disease   Renal function panel on 04/24/2020 revealed a creatinine of 1.41, calcium 8.7, albumin 3.1.  GFR was estimated at 43 ml/minute.  Hemoglobin has dropped from 11.4 to 8.4.  Discuss additional work-up.  Preauth Retacrit. 4.   Patient requesting refill for Xanax  RN:  Please contact Toni Arthurs, NP re: Xanax. 5.   RTC in 1 week for MD assessment, review of labs, discussion regarding direction of therapy, and +/- Retacrit.  I discussed the assessment and treatment plan with the patient.  The patient was provided an opportunity to ask questions and all were answered.  The patient agreed with the plan and demonstrated an understanding of the instructions.  The patient was advised to call back if the symptoms worsen or if the condition fails to improve as anticipated.  I provided 31 minutes of face-to-face time during this this encounter and > 50% was spent counseling as documented under my assessment and plan. An additional 15 minutes were spent reviewing her chart (Epic and Care Everywhere) including notes, labs, and imaging studies.    Melissa C. Mike Gip, MD, PhD    06/23/2020, 1:10 PM  I, Mirian Mo Tufford, am acting as Education administrator for [employer]. Mike Gip, MD, PhD.  I, Melissa C. Mike Gip, MD, have reviewed the above documentation for accuracy and completeness, and I agree with the above.

## 2020-06-24 ENCOUNTER — Inpatient Hospital Stay: Payer: Medicare Other

## 2020-06-24 ENCOUNTER — Inpatient Hospital Stay: Payer: Medicare Other | Admitting: Hematology and Oncology

## 2020-06-24 ENCOUNTER — Other Ambulatory Visit: Payer: Self-pay | Admitting: Hematology and Oncology

## 2020-06-24 ENCOUNTER — Telehealth: Payer: Self-pay

## 2020-06-24 ENCOUNTER — Other Ambulatory Visit: Payer: Self-pay | Admitting: *Deleted

## 2020-06-24 DIAGNOSIS — D509 Iron deficiency anemia, unspecified: Secondary | ICD-10-CM

## 2020-06-24 NOTE — Telephone Encounter (Signed)
Spoke with patient daughter. She has been trying everything she can to get her mom to come in for an appt but she is refusing. She has rescheduled todays appt for 07/02/20 and will try again. Advised her in the mean time to make sure she is taking at least one if not two iron pills a day and to try to get vit c in with them if possible. Dr C is aware.

## 2020-06-25 ENCOUNTER — Encounter: Payer: Self-pay | Admitting: Hematology and Oncology

## 2020-07-01 NOTE — Progress Notes (Incomplete)
[facility]  [address] [place], [place] [address] Phone: [phone]  Fax: [phone]   Clinic Day:  07/01/2020  Referring physician: Sherrin Daisy, MD  Chief Complaint: Lynn Rosario is a 87 y.o. female with anemia of stage 3b chronic kidney disease and iron deficiency who is seen for review of work-up and discussion regarding direction of therapy.  HPI: The patient was last seen in the hematology clinic on 05/13/2020 for new patient assessment. At that time, she felt "good".  She reported shortness of breath on exertion, leg swelling, easy bruising, rare diarrhea, arthritis in her knees and back, and poor balance. She wore a back brace at all times and took hydrocodone daily for back pain. She denied fevers, sweats, ice pica or any other cravings, nausea, vomiting, or bleeding (melena, hematochezia, hematuria or vaginal bleeding).  Work-up revealed a hematocrit of 27.7, hemoglobin 8.6, MCV 93.6, platelets 599,000, WBC 9,100. Ferritin was 21 with an iron saturation of 5% and a TIBC of 319. Reticulocyte count was 2.6%.  Sed rate was 73. Vitamin B12 was 4.578 and folate 70.7. TSH was 2.675. SPEP showed no M-spike. Kappa free light chains were 35.1, lambda free light chains 30.6, and ratio 1.15 (normal).  During the interim, ***    Past Medical History:  Diagnosis Date  . Anemia   . Hypertension   . Hypothyroidism   . Insomnia   . Lumbar radiculitis   . Lumbar spondylosis   . Lumbar stenosis with neurogenic claudication   . Stage III chronic kidney disease [facility])     Past Surgical History:  Procedure Laterality Date  . ABDOMINAL HYSTERECTOMY    . APPENDECTOMY    . TOTAL KNEE ARTHROPLASTY Left 2002    Family History  Problem Relation Age of Onset  . Stroke Mother   . CAD Father   . Stomach cancer Sister   . Breast cancer Sister   . Breast cancer Sister   . Breast cancer Daughter   The patient denies a family history of blood  disorders or kidney diseases.   Social History:  reports that she has never smoked. She has never used smokeless tobacco. She reports that she does not drink alcohol and does not use drugs. She does not use drugs or drink alcohol. She denies any exposure to radiation or toxins. She has not worked in 15-17 years. She worked in a [employer]. The patient lives in [place]. She lives three doors down from her daughter and POA, [name]. The patient is accompanied by [name]*** today.  Allergies:  Allergies  Allergen Reactions  . Celecoxib Other (See Comments)    Decreased kidney function   . Clarithromycin Other (See Comments)    Loss of taste     Current Medications: Current Outpatient Medications  Medication Sig Dispense Refill  . acetaminophen (TYLENOL) 325 MG tablet Take 650 mg by mouth daily after lunch.    . ALPRAZolam (XANAX) 0.25 MG tablet TAKE (1) TABLET BY MOUTH TWICE DAILY AS NEEDED FOR SLEEP/ANXIETY    . amLODipine (NORVASC) 2.5 MG tablet TAKE (1) TABLET BY MOUTH EVERY DAY    . aspirin EC 81 MG tablet Take 81 mg by mouth daily.     . Cetirizine HCl 10 MG CAPS Take 10 mg by mouth once.     . Cyanocobalamin 2500 MCG SUBL Place 1 tablet under the tongue daily.     . Cyanocobalamin 2500 MCG SUBL Place under the tongue.    Marland Kitchen  diphenhydramine-acetaminophen (TYLENOL PM) 25-500 MG TABS tablet Take by mouth.    . ferrous sulfate 325 (65 FE) MG EC tablet Take 325 mg by mouth 2 (two) times daily.     . ferrous sulfate 325 (65 FE) MG EC tablet Take by mouth.    . furosemide (LASIX) 20 MG tablet Take by mouth.    Marland Kitchen HYDROcodone-acetaminophen (NORCO/VICODIN) 5-325 MG per tablet 1 tablet twice daily    . levothyroxine (SYNTHROID) 75 MCG tablet TAKE 1 TABLET ON AN EMPTY STOMACH WITH A GLASS OF WATER AT LEAST 30 TO 60 MINUTES BEFORE BREAKFAST.    Marland Kitchen levothyroxine (SYNTHROID, LEVOTHROID) 75 MCG tablet Take 75 mcg by mouth daily before breakfast.    . lisinopril (PRINIVIL,ZESTRIL) 5 MG  tablet Take 5 mg by mouth daily.     . Multiple Vitamin (MULTI-VITAMINS) TABS Take 1 tablet by mouth daily.     . sodium bicarbonate 650 MG tablet Take 650 mg by mouth 2 (two) times daily.  (Patient not taking: Reported on 05/13/2020)    . traZODone (DESYREL) 50 MG tablet Take 50 mg by mouth at bedtime as needed for sleep.      No current facility-administered medications for this visit.    Review of Systems  Constitutional: Negative for chills, diaphoresis, fever, malaise/fatigue and weight loss.  HENT: Negative for congestion, ear discharge, ear pain, hearing loss, nosebleeds, sinus pain, sore throat and tinnitus.   Eyes: Negative for blurred vision.  Respiratory: Positive for shortness of breath (on exertion). Negative for cough, hemoptysis and sputum production.   Cardiovascular: Positive for leg swelling. Negative for chest pain and palpitations.  Gastrointestinal: Positive for diarrhea (rare). Negative for abdominal pain, blood in stool, constipation, heartburn, melena, nausea and vomiting.  Genitourinary: Positive for dysuria (burning and itching, on and off x5-6 months). Negative for frequency, hematuria and urgency.  Musculoskeletal: Positive for back pain (due to arthritis and scoliosis, wears a brace) and joint pain (knees). Negative for myalgias and neck pain.  Skin: Negative for itching and rash.  Neurological: Positive for headaches (due to allergies). Negative for dizziness, tingling, sensory change and weakness.       Poor balance.  Endo/Heme/Allergies: Positive for environmental allergies. Bruises/bleeds easily (upper extremities).       Stays cold  Psychiatric/Behavioral: Negative for depression and memory loss. The patient is nervous/anxious (takes Xanax at night). The patient does not have insomnia.   All other systems reviewed and are negative.  Performance status (ECOG): 1***  Vitals There were no vitals taken for this visit.   Physical Exam Vitals and nursing note  reviewed.  Constitutional:      General: She is not in acute distress.    Appearance: She is not diaphoretic.  HENT:     Head: Normocephalic and atraumatic.     Comments: Short gray hair.    Mouth/Throat:     Mouth: Mucous membranes are moist.     Pharynx: Oropharynx is clear.  Eyes:     General: No scleral icterus.    Extraocular Movements: Extraocular movements intact.     Conjunctiva/sclera: Conjunctivae normal.     Pupils: Pupils are equal, round, and reactive to light.     Comments: Glasses.  Blue eyes.  Cardiovascular:     Rate and Rhythm: Normal rate and regular rhythm.     Heart sounds: Normal heart sounds. No murmur heard.   Pulmonary:     Effort: Pulmonary effort is normal. No respiratory distress.  Breath sounds: Normal breath sounds. No wheezing or rales.  Chest:     Chest wall: No tenderness.  Breasts:     Right: No axillary adenopathy or supraclavicular adenopathy.     Left: No axillary adenopathy or supraclavicular adenopathy.    Abdominal:     General: Bowel sounds are normal. There is no distension.     Palpations: Abdomen is soft. There is no mass.     Tenderness: There is abdominal tenderness (left side with palpation). There is no guarding or rebound.  Musculoskeletal:        General: Tenderness (BLE) present. No swelling. Normal range of motion.     Cervical back: Normal range of motion and neck supple.     Right lower leg: Edema (3+) present.     Left lower leg: Edema (3+, L>R) present.     Comments: Back brace in place- removed for exam.  Lymphadenopathy:     Head:     Right side of head: No preauricular, posterior auricular or occipital adenopathy.     Left side of head: No preauricular, posterior auricular or occipital adenopathy.     Cervical: No cervical adenopathy.     Upper Body:     Right upper body: No supraclavicular or axillary adenopathy.     Left upper body: No supraclavicular or axillary adenopathy.     Lower Body: No right  inguinal adenopathy. No left inguinal adenopathy.  Skin:    General: Skin is warm and dry.  Neurological:     Mental Status: She is alert and oriented to person, place, and time.  Psychiatric:        Behavior: Behavior normal.        Thought Content: Thought content normal.        Judgment: Judgment normal.    No visits with results within 3 Day(s) from this visit.  Latest known visit with results is:  Appointment on 05/13/2020  Component Date Value Ref Range Status  . IgG (Immunoglobin G), Serum 05/13/2020 546* 586 - 1,602 mg/dL Final  . IgA 05/13/2020 57* 64 - 422 mg/dL Final  . IgM (Immunoglobulin M), Srm 05/13/2020 85  26 - 217 mg/dL Final  . Total Protein ELP 05/13/2020 6.1  6.0 - 8.5 g/dL Corrected  . Albumin SerPl Elph-Mcnc 05/13/2020 3.0  2.9 - 4.4 g/dL Corrected  . Alpha 1 05/13/2020 0.5* 0.0 - 0.4 g/dL Corrected  . Alpha2 Glob SerPl Elph-Mcnc 05/13/2020 1.2* 0.4 - 1.0 g/dL Corrected  . B-Globulin SerPl Elph-Mcnc 05/13/2020 0.9  0.7 - 1.3 g/dL Corrected  . Gamma Glob SerPl Elph-Mcnc 05/13/2020 0.6  0.4 - 1.8 g/dL Corrected  . M Protein SerPl Elph-Mcnc 05/13/2020 Not Observed  Not Observed g/dL Corrected  . Globulin, Total 05/13/2020 3.1  2.2 - 3.9 g/dL Corrected  . Albumin/Glob SerPl 05/13/2020 1.0  0.7 - 1.7 Corrected  . IFE 1 05/13/2020 Comment   Corrected   Comment: (NOTE) The immunofixation pattern appears unremarkable. Evidence of monoclonal protein is not apparent.   . Please Note 05/13/2020 Comment   Corrected   Comment: (NOTE) Protein electrophoresis scan will follow via computer, mail, or courier delivery. Performed At: [facility] [place], [place] [id number] Rush Farmer MD [id number]   . TSH 05/13/2020 2.675  0.350 - 4.500 uIU/mL Final   Comment: Performed by a 3rd Generation assay with a functional sensitivity of <=0.01 uIU/mL. Performed at [facility], [address], [place], [place] [address]   . Kappa  free  light chain 05/13/2020 35.1* 3.3 - 19.4 mg/L Final  . Lamda free light chains 05/13/2020 30.6* 5.7 - 26.3 mg/L Final  . Kappa, lamda light chain ratio 05/13/2020 1.15  0.26 - 1.65 Final   Comment: (NOTE) Performed At: [facility] [place], [place] [id number] Rush Farmer MD [id number]   . Folate 05/13/2020 70.7  >5.9 ng/mL Corrected   Comment: RESULTS CONFIRMED BY MANUAL DILUTION Performed at [facility], [address] [place], [place] [address] CORRECTED ON 01/05 AT 0105: PREVIOUSLY REPORTED AS >47.0   . Vitamin B-12 05/13/2020 4,578* 180 - 914 pg/mL Final   Comment: (NOTE) This assay is not validated for testing neonatal or myeloproliferative syndrome specimens for Vitamin B12 levels. Performed at [facility], [place] [address], [place], [place] [address]   . Retic Ct Pct 05/13/2020 2.6  0.4 - 3.1 % Final  . RBC. 05/13/2020 2.90* 3.87 - 5.11 MIL/uL Final  . Retic Count, Absolute 05/13/2020 76.6  19.0 - 186.0 K/uL Final  . Immature Retic Fract 05/13/2020 23.1* 2.3 - 15.9 % Final   Performed at [facility], [address], [place], [place] [address]  . Ferritin 05/13/2020 21  11 - 307 ng/mL Final   Performed at [facility], [address] [place], [place] [address]  . WBC 05/13/2020 9.1  4.0 - 10.5 K/uL Final  . RBC 05/13/2020 2.96* 3.87 - 5.11 MIL/uL Final  . Hemoglobin 05/13/2020 8.6* 12.0 - 15.0 g/dL Final  . HCT 05/13/2020 27.7* 36.0 - 46.0 % Final  . MCV 05/13/2020 93.6  80.0 - 100.0 fL Final  . MCH 05/13/2020 29.1  26.0 - 34.0 pg Final  . MCHC 05/13/2020 31.0  30.0 - 36.0 g/dL Final  . RDW 05/13/2020 13.3  11.5 - 15.5 % Final  . Platelets 05/13/2020 599* 150 - 400 K/uL Final  . nRBC 05/13/2020 0.0  0.0 - 0.2 % Final  . Neutrophils Relative % 05/13/2020 78  % Final  . Neutro Abs 05/13/2020 7.1  1.7 - 7.7 K/uL Final  . Lymphocytes Relative 05/13/2020 11  % Final  . Lymphs Abs 05/13/2020 1.0  0.7 - 4.0 K/uL  Final  . Monocytes Relative 05/13/2020 10  % Final  . Monocytes Absolute 05/13/2020 0.9  0.1 - 1.0 K/uL Final  . Eosinophils Relative 05/13/2020 0  % Final  . Eosinophils Absolute 05/13/2020 0.0  0.0 - 0.5 K/uL Final  . Basophils Relative 05/13/2020 1  % Final  . Basophils Absolute 05/13/2020 0.1  0.0 - 0.1 K/uL Final  . Immature Granulocytes 05/13/2020 0  % Final  . Abs Immature Granulocytes 05/13/2020 0.04  0.00 - 0.07 K/uL Final   Performed at [facility], [address], [place], [place] [address]  . Iron 05/13/2020 16* 28 - 170 ug/dL Final  . TIBC 05/13/2020 319  250 - 450 ug/dL Final  . Saturation Ratios 05/13/2020 5* 10.4 - 31.8 % Final  . UIBC 05/13/2020 303  ug/dL Final   Performed at [facility], [address] [place], [place] [address]  . Sed Rate 05/13/2020 73* 0 - 30 mm/hr Final   Performed at [facility], [address] [place], [place] [address]    Assessment:  Lynn Rosario is a 87 y.o. female with anemia of stage 3b chronic kidney disease and iron deficiency.  Diet appears good.  She has taken oral iron once daily for years but does not take  it with vitamin C. She takes Synthroid for hypothyroidism.  CBC on 04/24/2020 revealed a hematocrit 27.0, hemoglobin 8.4, MCV 96.1, platelets 542,000, WBC 8,600.   Work-up on 05/13/2020 revealed a hematocrit of 27.7, hemoglobin 8.6, MCV 93.6, platelets 599,000, WBC 9,100. Ferritin was 21 with an iron saturation of 5% and a TIBC of 319. Reticulocyte count was 2.6%.  Sed rate was 73. Vitamin B12 was 4.578 and folate 70.7. TSH was 2.675. SPEP showed no M-spike. Kappa free light chains were 35.1, lambda free light chains 30.6, and ratio 1.15 (normal).  She received Epogen 40,000 units on 03/04/2015 and 03/10/2015.  Creatinine was 1.41 on 04/24/2020  She has iron deficiency anemia.  She received Feraheme on 03/05/2015 and 03/11/2015.  She has taken oral iron for years.  She has not had a  colonoscopy in 15-20 years.    The patient does not plan to receive the COVID-19 vaccine. She had a bad reaction to the flu vaccine 10 years ago.  Symptomatically, ***  Plan: 1.   Labs today:  CBC, ferritin.   2.   Iron deficiency anemia  Patient has been on oral iron for years.   Encourage her to take iron with OJ or vitamin C.  She denies any bleeding.  Last colonoscopy was 15-20 years ago.  Discuss assessing iron stores.  Preauth Venofer. 3.   Anemia of stage 3b chronic kidney disease   Renal function panel on 04/24/2020 revealed a creatinine of 1.41, calcium 8.7, albumin 3.1.  GFR was estimated at 43 ml/minute.  Hemoglobin has dropped from 11.4 to 8.4.  Discuss additional work-up.  Preauth Retacrit. 4.   Patient requesting refill for Xanax  RN:  Please contact Toni Arthurs, NP re: Xanax. 5.   RTC in 1 week for MD assessment, review of labs, discussion regarding direction of therapy, and +/- Retacrit.  I discussed the assessment and treatment plan with the patient.  The patient was provided an opportunity to ask questions and all were answered.  The patient agreed with the plan and demonstrated an understanding of the instructions.  The patient was advised to call back if the symptoms worsen or if the condition fails to improve as anticipated.  I provided *** minutes of face-to-face time during this this encounter and > 50% was spent counseling as documented under my assessment and plan.  Melissa C. Mike Gip, MD, PhD    07/01/2020, 8:52 AM  I, Mirian Mo Tufford, am acting as Education administrator for [employer]. Mike Gip, MD, PhD.  I, Melissa C. Mike Gip, MD, have reviewed the above documentation for accuracy and completeness, and I agree with the above.

## 2020-07-02 ENCOUNTER — Inpatient Hospital Stay: Payer: Medicare Other

## 2020-07-02 ENCOUNTER — Inpatient Hospital Stay: Payer: Medicare Other | Admitting: Hematology and Oncology

## 2020-07-02 ENCOUNTER — Encounter: Payer: Self-pay | Admitting: Hematology and Oncology

## 2020-07-02 ENCOUNTER — Inpatient Hospital Stay (HOSPITAL_BASED_OUTPATIENT_CLINIC_OR_DEPARTMENT_OTHER): Payer: Medicare Other | Admitting: Hematology and Oncology

## 2020-07-02 DIAGNOSIS — D509 Iron deficiency anemia, unspecified: Secondary | ICD-10-CM

## 2020-07-02 NOTE — Progress Notes (Signed)
[facility]  [address], [address] [place], [place] [address] Phone: [phone]  Fax: [phone]   Telephone Office Visit:  07/02/2020  Referring physician: Sherrin Daisy, MD  I connected with Lynn Rosario on 07/02/2020 at 3:13 PM by telephone and verified that I was speaking with the correct person using 2 identifiers.  The patient was at home.  I discussed the limitations, risk, security and privacy concerns of performing an evaluation and management service by videoconferencing and the availability of in person appointments.  I also discussed with the patient that there may be a patient responsible charge related to this service.  The patient expressed understanding and agreed to proceed.   Chief Complaint: Lynn Rosario is a 87 y.o. female with anemia of stage 3b chronic kidney disease and iron deficiency who is seen for review of work-up and discussion regarding direction of therapy.  HPI: The patient was last seen in the hematology clinic on 05/13/2020 for new patient assessment. At that time, she felt "good".  She reported shortness of breath on exertion, leg swelling, easy bruising, rare diarrhea, arthritis in her knees and back, and poor balance. She wore a back brace at all times and took hydrocodone daily for back pain. She denied fevers, sweats, ice pica or any other cravings, nausea, vomiting, or bleeding (melena, hematochezia, hematuria or vaginal bleeding).  Work-up revealed a hematocrit of 27.7, hemoglobin 8.6, MCV 93.6, platelets 599,000, WBC 9,100. Ferritin was 21 with an iron saturation of 5% and a TIBC of 319. Reticulocyte count was 2.6%.  Sed rate was 73. Vitamin B12 was 4,578 and folate 70.7. TSH was 2.675.  SPEP showed no M-spike. Kappa free light chains were 35.1, lambda free light chains 30.6, and ratio 1.15 (normal).  During the interim, she has been "pretty good." Her main problem is her arthritis.  She denies any melena,  hematochezia, hematuria or vaginal bleeding.  She had a bit of diarrhea this morning. She does not think it is food related. She eats well. She eats cereal for breakfast, half of a sandwich for lunch, and dinner. Lynn Rosario brings her food a few times per week. The rest of her symptoms are stable.  The patient takes oral iron once daily with Vitamin C. She stopped vitamin B12 in early January when she was first seen in clinic. When she was taking it, she took 1,000 mcg daily.    Past Medical History:  Diagnosis Date  . Anemia   . Hypertension   . Hypothyroidism   . Insomnia   . Lumbar radiculitis   . Lumbar spondylosis   . Lumbar stenosis with neurogenic claudication   . Stage III chronic kidney disease [facility])     Past Surgical History:  Procedure Laterality Date  . ABDOMINAL HYSTERECTOMY    . APPENDECTOMY    . TOTAL KNEE ARTHROPLASTY Left 2002    Family History  Problem Relation Age of Onset  . Stroke Mother   . CAD Father   . Stomach cancer Sister   . Breast cancer Sister   . Breast cancer Sister   . Breast cancer Daughter   The patient denies a family history of blood disorders or kidney diseases.   Social History:  reports that she has never smoked. She has never used smokeless tobacco. She reports that she does not drink alcohol and does not use drugs. She does not use drugs or drink alcohol. She denies any exposure to radiation or toxins. She has not worked  in 15-17 years. She worked in a [employer], engineering. The patient lives in [place]. She lives three doors down from her daughter and POA, Lynn Rosario. The patient is accompanied by Lynn Rosario today.  Participants in the patient's visit and their role in the encounter included the patient and Orlene Och, RN today.  The intake visit was provided by Orlene Och, RN today.  Allergies:  Allergies  Allergen Reactions  . Celecoxib Other (See Comments)    Decreased kidney function   . Clarithromycin Other (See Comments)     Loss of taste     Current Medications: Current Outpatient Medications  Medication Sig Dispense Refill  . ALPRAZolam (XANAX) 0.25 MG tablet TAKE (1) TABLET BY MOUTH TWICE DAILY AS NEEDED FOR SLEEP/ANXIETY    . aspirin EC 81 MG tablet Take 81 mg by mouth daily.     . Cetirizine HCl 10 MG CAPS Take 10 mg by mouth as needed.    . ferrous sulfate 325 (65 FE) MG EC tablet Take by mouth daily.    . furosemide (LASIX) 20 MG tablet Take 20 mg by mouth. Every other day    . HYDROcodone-acetaminophen (NORCO/VICODIN) 5-325 MG per tablet 1 tablet 3 (three) times daily.    Marland Kitchen levothyroxine (SYNTHROID) 75 MCG tablet TAKE 1 TABLET ON AN EMPTY STOMACH WITH A GLASS OF WATER AT LEAST 30 TO 60 MINUTES BEFORE BREAKFAST.    . Multiple Vitamin (MULTI-VITAMINS) TABS Take 1 tablet by mouth daily.     . sodium bicarbonate 650 MG tablet Take 650 mg by mouth 2 (two) times daily.    Marland Kitchen acetaminophen (TYLENOL) 325 MG tablet Take 650 mg by mouth daily after lunch. (Patient not taking: Reported on 07/02/2020)    . amLODipine (NORVASC) 2.5 MG tablet TAKE (1) TABLET BY MOUTH EVERY DAY (Patient not taking: Reported on 07/02/2020)    . Cyanocobalamin 2500 MCG SUBL Place 1 tablet under the tongue daily.  (Patient not taking: Reported on 07/02/2020)     No current facility-administered medications for this visit.    Review of Systems  Constitutional: Negative for chills, diaphoresis, fever, malaise/fatigue and weight loss (no new weight).  HENT: Negative for congestion, ear discharge, ear pain, hearing loss, nosebleeds, sinus pain, sore throat and tinnitus.   Eyes: Negative for blurred vision.  Respiratory: Positive for shortness of breath (on exertion). Negative for cough, hemoptysis and sputum production.   Cardiovascular: Positive for leg swelling. Negative for chest pain and palpitations.  Gastrointestinal: Positive for diarrhea (this morning). Negative for abdominal pain, blood in stool, constipation, heartburn, melena,  nausea and vomiting.  Genitourinary: Positive for dysuria (burning and itching, on and off x5-6 months). Negative for frequency, hematuria and urgency.  Musculoskeletal: Positive for back pain (due to arthritis and scoliosis, wears a brace) and joint pain (knees). Negative for myalgias and neck pain.  Skin: Negative for itching and rash.  Neurological: Positive for headaches (due to allergies). Negative for dizziness, tingling, sensory change and weakness.       Poor balance.  Endo/Heme/Allergies: Positive for environmental allergies. Does not bruise/bleed easily.  Psychiatric/Behavioral: Negative for depression and memory loss. The patient is nervous/anxious (takes Xanax at night). The patient does not have insomnia.   All other systems reviewed and are negative.  Performance status (ECOG): 1  Vitals There were no vitals taken for this visit.   Physical Exam Vitals and nursing note reviewed.  Constitutional:      General: She is not in acute  distress.    Appearance: She is not diaphoretic.  HENT:     Head: Normocephalic and atraumatic.  Eyes:     General: No scleral icterus.    Conjunctiva/sclera: Conjunctivae normal.  Neurological:     Mental Status: She is alert and oriented to person, place, and time.  Psychiatric:        Behavior: Behavior normal.        Thought Content: Thought content normal.        Judgment: Judgment normal.    No visits with results within 3 Day(s) from this visit.  Latest known visit with results is:  Appointment on 05/13/2020  Component Date Value Ref Range Status  . IgG (Immunoglobin G), Serum 05/13/2020 546* 586 - 1,602 mg/dL Final  . IgA 05/13/2020 57* 64 - 422 mg/dL Final  . IgM (Immunoglobulin M), Srm 05/13/2020 85  26 - 217 mg/dL Final  . Total Protein ELP 05/13/2020 6.1  6.0 - 8.5 g/dL Corrected  . Albumin SerPl Elph-Mcnc 05/13/2020 3.0  2.9 - 4.4 g/dL Corrected  . Alpha 1 05/13/2020 0.5* 0.0 - 0.4 g/dL Corrected  . Alpha2 Glob SerPl  Elph-Mcnc 05/13/2020 1.2* 0.4 - 1.0 g/dL Corrected  . B-Globulin SerPl Elph-Mcnc 05/13/2020 0.9  0.7 - 1.3 g/dL Corrected  . Gamma Glob SerPl Elph-Mcnc 05/13/2020 0.6  0.4 - 1.8 g/dL Corrected  . M Protein SerPl Elph-Mcnc 05/13/2020 Not Observed  Not Observed g/dL Corrected  . Globulin, Total 05/13/2020 3.1  2.2 - 3.9 g/dL Corrected  . Albumin/Glob SerPl 05/13/2020 1.0  0.7 - 1.7 Corrected  . IFE 1 05/13/2020 Comment   Corrected   Comment: (NOTE) The immunofixation pattern appears unremarkable. Evidence of monoclonal protein is not apparent.   . Please Note 05/13/2020 Comment   Corrected   Comment: (NOTE) Protein electrophoresis scan will follow via computer, mail, or courier delivery. Performed At: [facility] [place], [place] [id number] Rush Farmer MD [id number]   . TSH 05/13/2020 2.675  0.350 - 4.500 uIU/mL Final   Comment: Performed by a 3rd Generation assay with a functional sensitivity of <=0.01 uIU/mL. Performed at [facility], [address] [place], [place] [address]   . Kappa free light chain 05/13/2020 35.1* 3.3 - 19.4 mg/L Final  . Lamda free light chains 05/13/2020 30.6* 5.7 - 26.3 mg/L Final  . Kappa, lamda light chain ratio 05/13/2020 1.15  0.26 - 1.65 Final   Comment: (NOTE) Performed At: [facility] [place], [place] [id number] Rush Farmer MD [id number]   . Folate 05/13/2020 70.7  >5.9 ng/mL Corrected   Comment: RESULTS CONFIRMED BY MANUAL DILUTION Performed at [facility], [address] [place], [place] [address] CORRECTED ON 01/05 AT 0105: PREVIOUSLY REPORTED AS >47.0   . Vitamin B-12 05/13/2020 4,578* 180 - 914 pg/mL Final   Comment: (NOTE) This assay is not validated for testing neonatal or myeloproliferative syndrome specimens for Vitamin B12 levels. Performed at [facility], [place] [address] [place], [place] [address]   . Retic Ct Pct 05/13/2020 2.6  0.4 - 3.1 %  Final  . RBC. 05/13/2020 2.90* 3.87 - 5.11 MIL/uL Final  . Retic Count, Absolute 05/13/2020 76.6  19.0 - 186.0 K/uL Final  . Immature Retic Fract 05/13/2020 23.1* 2.3 - 15.9 % Final   Performed at [facility], [address] [place], [place] [address]  . Ferritin 05/13/2020 21  11 - 307 ng/mL Final   Performed at [facility], [address] [place], [place] [address]  . WBC 05/13/2020 9.1  4.0 - 10.5 K/uL Final  . RBC 05/13/2020 2.96* 3.87 - 5.11 MIL/uL Final  . Hemoglobin 05/13/2020 8.6* 12.0 - 15.0 g/dL Final  . HCT 05/13/2020 27.7* 36.0 - 46.0 % Final  . MCV 05/13/2020 93.6  80.0 - 100.0 fL Final  . MCH 05/13/2020 29.1  26.0 - 34.0 pg Final  . MCHC 05/13/2020 31.0  30.0 - 36.0 g/dL Final  . RDW 05/13/2020 13.3  11.5 - 15.5 % Final  . Platelets 05/13/2020 599* 150 - 400 K/uL Final  . nRBC 05/13/2020 0.0  0.0 - 0.2 % Final  . Neutrophils Relative % 05/13/2020 78  % Final  . Neutro Abs 05/13/2020 7.1  1.7 - 7.7 K/uL Final  . Lymphocytes Relative 05/13/2020 11  % Final  . Lymphs Abs 05/13/2020 1.0  0.7 - 4.0 K/uL Final  . Monocytes Relative 05/13/2020 10  % Final  . Monocytes Absolute 05/13/2020 0.9  0.1 - 1.0 K/uL Final  . Eosinophils Relative 05/13/2020 0  % Final  . Eosinophils Absolute 05/13/2020 0.0  0.0 - 0.5 K/uL Final  . Basophils Relative 05/13/2020 1  % Final  . Basophils Absolute 05/13/2020 0.1  0.0 - 0.1 K/uL Final  . Immature Granulocytes 05/13/2020 0  % Final  . Abs Immature Granulocytes 05/13/2020 0.04  0.00 - 0.07 K/uL Final   Performed at [facility], [address], [place], [place] [address]  . Iron 05/13/2020 16* 28 - 170 ug/dL Final  . TIBC 05/13/2020 319  250 - 450 ug/dL Final  . Saturation Ratios 05/13/2020 5* 10.4 - 31.8 % Final  . UIBC 05/13/2020 303  ug/dL Final   Performed at [facility], [address], [place], [place] [address]  . Sed Rate 05/13/2020 73* 0 - 30 mm/hr Final   Performed at [facility], [address] [place],  [address]    Assessment:  Lynn Rosario is a 87 y.o. female with anemia of stage 3b chronic kidney disease and iron deficiency.  Diet appears good.  She has taken oral iron once daily for years but does not take it with vitamin C. She takes Synthroid for hypothyroidism.  CBC on 04/24/2020 revealed a hematocrit 27.0, hemoglobin 8.4, MCV 96.1, platelets 542,000, WBC 8,600.   Work-up on 05/13/2020 revealed a hematocrit of 27.7, hemoglobin 8.6, MCV 93.6, platelets 599,000, WBC 9,100. Ferritin was 21 with an iron saturation of 5% (low) and a TIBC of 319. Reticulocyte count was 2.6%.  Sed rate was 73. Vitamin B12 was 4,578 and folate 70.7. TSH was 2.675. SPEP showed no M-spike. Kappa free light chains were 35.1, lambda free light chains 30.6, and ratio 1.15 (normal).  She received Epogen 40,000 units on 03/04/2015 and 03/10/2015.  Creatinine was 1.41 on 04/24/2020  She has iron deficiency anemia.  She received Feraheme on 03/05/2015 and 03/11/2015.  She has taken oral iron for years.  She has not had a colonoscopy in 15-20 years.    The patient does not plan to receive the COVID-19 vaccine. She had a bad reaction to the flu vaccine 10 years ago.  Symptomatically, she feels "pretty good." Her main problem is her arthritis.  She denies any melena, hematochezia, hematuria or vaginal bleeding.  Plan: 1.   Iron deficiency anemia  Hematocrit 27.7.  Hemoglobin 8.6.  MCV 93.6 on 05/13/2020.     Ferritin 21 with an iron saturation of  5% and a TIBC of 319.     Additional work-up was negative.    She has been on oral iron for years.   Discuss falling hemoglobin despite oral iron.  She denies any bleeding.  Last colonoscopy was 15-20 years ago.  Discuss consideration of Venofer. 2.   Anemia of stage 3b chronic kidney disease   Creatinine was 1.41 on 04/24/2020.  GFR was estimated at 43 ml/minute.  Hemoglobin has dropped from 11.4 to 8.4.  Consider  Retacrit if iron stores are normal and hemoglobin remains < 10. 3.   RTC on 07/08/2020 at 10 AM for MD assess, labs (CBC, ferritin, hold tube), and +/- Venofer  I discussed the assessment and treatment plan with the patient.  The patient was provided an opportunity to ask questions and all were answered.  The patient agreed with the plan and demonstrated an understanding of the instructions.  The patient was advised to call back if the symptoms worsen or if the condition fails to improve as anticipated.  I provided 14 minutes (2:57 PM - 3:10 PM) of non-face-to-face telephone visit time during this this encounter and > 50% was spent counseling as documented under my assessment and plan.  I provided these services from the [facility] office   Melissa C. Mike Gip, MD, PhD    07/02/2020, 3:13 PM  I, De Burrs, am acting as scribe for [employer]. Mike Gip, MD, PhD.  I, Melissa C. Mike Gip, MD, have reviewed the above documentation for accuracy and completeness, and I agree with the above.

## 2020-07-07 NOTE — Progress Notes (Incomplete)
[facility]  [address] [place], [place] [address] Phone: [phone]  Fax: [phone]   Clinic Day:  07/07/2020  Referring physician: Sherrin Daisy, MD  Chief Complaint: Lynn Rosario is a 87 y.o. female with anemia of stage 3b chronic kidney disease and iron deficiency who is seen for 1 week assessment.  HPI: The patient was last seen in the hematology clinic on 07/02/2020 via telephone. At that time, ***.   During the interim, ***   Past Medical History:  Diagnosis Date  . Anemia   . Hypertension   . Hypothyroidism   . Insomnia   . Lumbar radiculitis   . Lumbar spondylosis   . Lumbar stenosis with neurogenic claudication   . Stage III chronic kidney disease [facility])     Past Surgical History:  Procedure Laterality Date  . ABDOMINAL HYSTERECTOMY    . APPENDECTOMY    . TOTAL KNEE ARTHROPLASTY Left 2002    Family History  Problem Relation Age of Onset  . Stroke Mother   . CAD Father   . Stomach cancer Sister   . Breast cancer Sister   . Breast cancer Sister   . Breast cancer Daughter   The patient denies a family history of blood disorders or kidney diseases.   Social History:  reports that she has never smoked. She has never used smokeless tobacco. She reports that she does not drink alcohol and does not use drugs. She does not use drugs or drink alcohol. She denies any exposure to radiation or toxins. She has not worked in 15-17 years. She worked in a [employer], engineering. The patient lives in [place]. She lives three doors down from her daughter and POA, [name]. The patient is accompanied by [name]*** today.  Allergies:  Allergies  Allergen Reactions  . Celecoxib Other (See Comments)    Decreased kidney function   . Clarithromycin Other (See Comments)    Loss of taste     Current Medications: Current Outpatient Medications  Medication Sig Dispense Refill  . acetaminophen (TYLENOL) 325 MG tablet Take 650  mg by mouth daily after lunch. (Patient not taking: Reported on 07/02/2020)    . ALPRAZolam (XANAX) 0.25 MG tablet TAKE (1) TABLET BY MOUTH TWICE DAILY AS NEEDED FOR SLEEP/ANXIETY    . amLODipine (NORVASC) 2.5 MG tablet TAKE (1) TABLET BY MOUTH EVERY DAY (Patient not taking: Reported on 07/02/2020)    . aspirin EC 81 MG tablet Take 81 mg by mouth daily.     . Cetirizine HCl 10 MG CAPS Take 10 mg by mouth as needed.    . Cyanocobalamin 2500 MCG SUBL Place 1 tablet under the tongue daily.  (Patient not taking: Reported on 07/02/2020)    . ferrous sulfate 325 (65 FE) MG EC tablet Take by mouth daily.    . furosemide (LASIX) 20 MG tablet Take 20 mg by mouth. Every other day    . HYDROcodone-acetaminophen (NORCO/VICODIN) 5-325 MG per tablet 1 tablet 3 (three) times daily.    Marland Kitchen levothyroxine (SYNTHROID) 75 MCG tablet TAKE 1 TABLET ON AN EMPTY STOMACH WITH A GLASS OF WATER AT LEAST 30 TO 60 MINUTES BEFORE BREAKFAST.    . Multiple Vitamin (MULTI-VITAMINS) TABS Take 1 tablet by mouth daily.     . sodium bicarbonate 650 MG tablet Take 650 mg by mouth 2 (two) times daily.     No current facility-administered medications for this visit.    Review of Systems  Constitutional: Negative for chills, diaphoresis, fever, malaise/fatigue and weight loss (no new weight).  HENT: Negative for congestion, ear discharge, ear pain, hearing loss, nosebleeds, sinus pain, sore throat and tinnitus.   Eyes: Negative for blurred vision.  Respiratory: Positive for shortness of breath (on exertion). Negative for cough, hemoptysis and sputum production.   Cardiovascular: Positive for leg swelling. Negative for chest pain and palpitations.  Gastrointestinal: Positive for diarrhea (this morning). Negative for abdominal pain, blood in stool, constipation, heartburn, melena, nausea and vomiting.  Genitourinary: Positive for dysuria (burning and itching, on and off x5-6 months). Negative for frequency, hematuria and urgency.   Musculoskeletal: Positive for back pain (due to arthritis and scoliosis, wears a brace) and joint pain (knees). Negative for myalgias and neck pain.  Skin: Negative for itching and rash.  Neurological: Positive for headaches (due to allergies). Negative for dizziness, tingling, sensory change and weakness.       Poor balance.  Endo/Heme/Allergies: Positive for environmental allergies. Does not bruise/bleed easily.  Psychiatric/Behavioral: Negative for depression and memory loss. The patient is nervous/anxious (takes Xanax at night). The patient does not have insomnia.   All other systems reviewed and are negative.  Performance status (ECOG): 1***  Vitals There were no vitals taken for this visit.   Physical Exam Vitals and nursing note reviewed.  Constitutional:      General: She is not in acute distress.    Appearance: She is not diaphoretic.  HENT:     Head: Normocephalic and atraumatic.  Eyes:     General: No scleral icterus.    Conjunctiva/sclera: Conjunctivae normal.  Neurological:     Mental Status: She is alert and oriented to person, place, and time.  Psychiatric:        Behavior: Behavior normal.        Thought Content: Thought content normal.        Judgment: Judgment normal.    No visits with results within 3 Day(s) from this visit.  Latest known visit with results is:  Appointment on 05/13/2020  Component Date Value Ref Range Status  . IgG (Immunoglobin G), Serum 05/13/2020 546* 586 - 1,602 mg/dL Final  . IgA 05/13/2020 57* 64 - 422 mg/dL Final  . IgM (Immunoglobulin M), Srm 05/13/2020 85  26 - 217 mg/dL Final  . Total Protein ELP 05/13/2020 6.1  6.0 - 8.5 g/dL Corrected  . Albumin SerPl Elph-Mcnc 05/13/2020 3.0  2.9 - 4.4 g/dL Corrected  . Alpha 1 05/13/2020 0.5* 0.0 - 0.4 g/dL Corrected  . Alpha2 Glob SerPl Elph-Mcnc 05/13/2020 1.2* 0.4 - 1.0 g/dL Corrected  . B-Globulin SerPl Elph-Mcnc 05/13/2020 0.9  0.7 - 1.3 g/dL Corrected  . Gamma Glob SerPl Elph-Mcnc  05/13/2020 0.6  0.4 - 1.8 g/dL Corrected  . M Protein SerPl Elph-Mcnc 05/13/2020 Not Observed  Not Observed g/dL Corrected  . Globulin, Total 05/13/2020 3.1  2.2 - 3.9 g/dL Corrected  . Albumin/Glob SerPl 05/13/2020 1.0  0.7 - 1.7 Corrected  . IFE 1 05/13/2020 Comment   Corrected   Comment: (NOTE) The immunofixation pattern appears unremarkable. Evidence of monoclonal protein is not apparent.   . Please Note 05/13/2020 Comment   Corrected   Comment: (NOTE) Protein electrophoresis scan will follow via computer, mail, or courier delivery. Performed At: [facility] [place], [place] [id number] Rush Farmer MD KN:[id number]   . TSH 05/13/2020 2.675  0.350 - 4.500 uIU/mL Final   Comment: Performed by a 3rd Generation assay with a functional sensitivity  of <=0.01 uIU/mL. Performed at [facility], [address], [place], [place] [address]   . Kappa free light chain 05/13/2020 35.1* 3.3 - 19.4 mg/L Final  . Lamda free light chains 05/13/2020 30.6* 5.7 - 26.3 mg/L Final  . Kappa, lamda light chain ratio 05/13/2020 1.15  0.26 - 1.65 Final   Comment: (NOTE) Performed At: [facility] [place], [place] [id number] Rush Farmer MD SJ:[id number]   . Folate 05/13/2020 70.7  >5.9 ng/mL Corrected   Comment: RESULTS CONFIRMED BY MANUAL DILUTION Performed at [facility], [address] [place], [place] [address] CORRECTED ON 01/05 AT 0105: PREVIOUSLY REPORTED AS >47.0   . Vitamin B-12 05/13/2020 4,578* 180 - 914 pg/mL Final   Comment: (NOTE) This assay is not validated for testing neonatal or myeloproliferative syndrome specimens for Vitamin B12 levels. Performed at [facility], [place] [address], [place], [place] [address]   . Retic Ct Pct 05/13/2020 2.6  0.4 - 3.1 % Final  . RBC. 05/13/2020 2.90* 3.87 - 5.11 MIL/uL Final  . Retic Count, Absolute 05/13/2020 76.6  19.0 - 186.0 K/uL Final  . Immature Retic Fract  05/13/2020 23.1* 2.3 - 15.9 % Final   Performed at [facility], [address], [place], [place] [address]  . Ferritin 05/13/2020 21  11 - 307 ng/mL Final   Performed at [facility], [address] [place], [place] [address]  . WBC 05/13/2020 9.1  4.0 - 10.5 K/uL Final  . RBC 05/13/2020 2.96* 3.87 - 5.11 MIL/uL Final  . Hemoglobin 05/13/2020 8.6* 12.0 - 15.0 g/dL Final  . HCT 05/13/2020 27.7* 36.0 - 46.0 % Final  . MCV 05/13/2020 93.6  80.0 - 100.0 fL Final  . MCH 05/13/2020 29.1  26.0 - 34.0 pg Final  . MCHC 05/13/2020 31.0  30.0 - 36.0 g/dL Final  . RDW 05/13/2020 13.3  11.5 - 15.5 % Final  . Platelets 05/13/2020 599* 150 - 400 K/uL Final  . nRBC 05/13/2020 0.0  0.0 - 0.2 % Final  . Neutrophils Relative % 05/13/2020 78  % Final  . Neutro Abs 05/13/2020 7.1  1.7 - 7.7 K/uL Final  . Lymphocytes Relative 05/13/2020 11  % Final  . Lymphs Abs 05/13/2020 1.0  0.7 - 4.0 K/uL Final  . Monocytes Relative 05/13/2020 10  % Final  . Monocytes Absolute 05/13/2020 0.9  0.1 - 1.0 K/uL Final  . Eosinophils Relative 05/13/2020 0  % Final  . Eosinophils Absolute 05/13/2020 0.0  0.0 - 0.5 K/uL Final  . Basophils Relative 05/13/2020 1  % Final  . Basophils Absolute 05/13/2020 0.1  0.0 - 0.1 K/uL Final  . Immature Granulocytes 05/13/2020 0  % Final  . Abs Immature Granulocytes 05/13/2020 0.04  0.00 - 0.07 K/uL Final   Performed at [facility], [address] [place], [place] [address]  . Iron 05/13/2020 16* 28 - 170 ug/dL Final  . TIBC 05/13/2020 319  250 - 450 ug/dL Final  . Saturation Ratios 05/13/2020 5* 10.4 - 31.8 % Final  . UIBC 05/13/2020 303  ug/dL Final   Performed at [facility], [address], [place], [place] [address]  . Sed Rate 05/13/2020 73* 0 - 30 mm/hr Final   Performed at [facility], [address] [place],  [address]    Assessment:  Lynn Rosario is a 87 y.o. female with anemia of stage 3b chronic kidney  disease and iron  deficiency.  Diet appears good.  She has taken oral iron once daily for years but does not take it with vitamin C. She takes Synthroid for hypothyroidism.  CBC on 04/24/2020 revealed a hematocrit 27.0, hemoglobin 8.4, MCV 96.1, platelets 542,000, WBC 8,600.   Work-up on 05/13/2020 revealed a hematocrit of 27.7, hemoglobin 8.6, MCV 93.6, platelets 599,000, WBC 9,100. Ferritin was 21 with an iron saturation of 5% and a TIBC of 319. Reticulocyte count was 2.6%.  Sed rate was 73. Vitamin B12 was 4,578 and folate 70.7. TSH was 2.675. SPEP showed no M-spike. Kappa free light chains were 35.1, lambda free light chains 30.6, and ratio 1.15 (normal).  She received Epogen 40,000 units on 03/04/2015 and 03/10/2015.  Creatinine was 1.41 on 04/24/2020  She has iron deficiency anemia.  She received Feraheme on 03/05/2015 and 03/11/2015.  She has taken oral iron for years.  She has not had a colonoscopy in 15-20 years.    The patient does not plan to receive the COVID-19 vaccine. She had a bad reaction to the flu vaccine 10 years ago.  Symptomatically, ***  Plan: 1.   Labs today: CBC, ferritin   2.   Iron deficiency anemia  Patient has been on oral iron for years.   Encourage her to take iron with OJ or vitamin C.  She denies any bleeding.  Last colonoscopy was 15-20 years ago.  Discuss assessing iron stores.  Preauth Venofer. 3.   Anemia of stage 3b chronic kidney disease   Renal function panel on 04/24/2020 revealed a creatinine of 1.41, calcium 8.7, albumin 3.1.  GFR was estimated at 43 ml/minute.  Hemoglobin has dropped from 11.4 to 8.4.  Discuss additional work-up.  Preauth Retacrit. 4.   Patient requesting refill for Xanax  RN:  Please contact Toni Arthurs, NP re: Xanax. 5.   RTC in 1 week for MD assessment, review of labs, discussion regarding direction of therapy, and +/- Retacrit.  I discussed the assessment and treatment plan with the patient.  The patient was provided  an opportunity to ask questions and all were answered.  The patient agreed with the plan and demonstrated an understanding of the instructions.  The patient was advised to call back if the symptoms worsen or if the condition fails to improve as anticipated.  I provided *** minutes of face-to-face time during this this encounter and > 50% was spent counseling as documented under my assessment and plan.  Melissa C. Mike Gip, MD, PhD    07/07/2020, 12:55 PM  I, Mirian Mo Tufford, am acting as Education administrator for [employer]. Mike Gip, MD, PhD.  I, Melissa C. Mike Gip, MD, have reviewed the above documentation for accuracy and completeness, and I agree with the above.

## 2020-07-08 ENCOUNTER — Inpatient Hospital Stay: Payer: Medicare Other

## 2020-07-08 ENCOUNTER — Inpatient Hospital Stay: Payer: Medicare Other | Attending: Hematology and Oncology | Admitting: Hematology and Oncology

## 2020-07-08 DIAGNOSIS — D631 Anemia in chronic kidney disease: Secondary | ICD-10-CM | POA: Insufficient documentation

## 2020-07-08 DIAGNOSIS — N1832 Chronic kidney disease, stage 3b: Secondary | ICD-10-CM | POA: Insufficient documentation

## 2020-07-10 ENCOUNTER — Ambulatory Visit: Payer: Medicare Other

## 2020-07-10 ENCOUNTER — Ambulatory Visit: Payer: Medicare Other | Admitting: Hematology and Oncology

## 2020-07-10 ENCOUNTER — Other Ambulatory Visit: Payer: Medicare Other

## 2020-07-15 ENCOUNTER — Ambulatory Visit (INDEPENDENT_AMBULATORY_CARE_PROVIDER_SITE_OTHER): Payer: Medicare Other

## 2020-07-15 ENCOUNTER — Other Ambulatory Visit: Payer: Self-pay

## 2020-07-15 ENCOUNTER — Emergency Department
Admission: EM | Admit: 2020-07-15 | Discharge: 2020-07-16 | Disposition: A | Discharge status: Home / Self Care | Payer: Medicare Other | Attending: Emergency Medicine | Admitting: Emergency Medicine

## 2020-07-15 ENCOUNTER — Ambulatory Visit
Admission: EM | Admit: 2020-07-15 | Discharge: 2020-07-15 | Disposition: A | Discharge status: Home / Self Care | Payer: Medicare Other | Attending: Emergency Medicine | Admitting: Emergency Medicine

## 2020-07-15 ENCOUNTER — Emergency Department: Payer: Medicare Other

## 2020-07-15 DIAGNOSIS — R0602 Shortness of breath: Secondary | ICD-10-CM

## 2020-07-15 DIAGNOSIS — Z7982 Long term (current) use of aspirin: Secondary | ICD-10-CM | POA: Diagnosis not present

## 2020-07-15 DIAGNOSIS — I509 Heart failure, unspecified: Secondary | ICD-10-CM | POA: Diagnosis not present

## 2020-07-15 DIAGNOSIS — D649 Anemia, unspecified: Secondary | ICD-10-CM | POA: Insufficient documentation

## 2020-07-15 DIAGNOSIS — Z20822 Contact with and (suspected) exposure to covid-19: Secondary | ICD-10-CM | POA: Diagnosis not present

## 2020-07-15 DIAGNOSIS — Z96652 Presence of left artificial knee joint: Secondary | ICD-10-CM | POA: Diagnosis not present

## 2020-07-15 DIAGNOSIS — I517 Cardiomegaly: Secondary | ICD-10-CM | POA: Diagnosis not present

## 2020-07-15 DIAGNOSIS — K449 Diaphragmatic hernia without obstruction or gangrene: Secondary | ICD-10-CM | POA: Diagnosis not present

## 2020-07-15 DIAGNOSIS — J90 Pleural effusion, not elsewhere classified: Secondary | ICD-10-CM

## 2020-07-15 DIAGNOSIS — M40204 Unspecified kyphosis, thoracic region: Secondary | ICD-10-CM

## 2020-07-15 DIAGNOSIS — N183 Chronic kidney disease, stage 3 unspecified: Secondary | ICD-10-CM | POA: Diagnosis not present

## 2020-07-15 DIAGNOSIS — I130 Hypertensive heart and chronic kidney disease with heart failure and stage 1 through stage 4 chronic kidney disease, or unspecified chronic kidney disease: Secondary | ICD-10-CM | POA: Insufficient documentation

## 2020-07-15 DIAGNOSIS — D631 Anemia in chronic kidney disease: Secondary | ICD-10-CM | POA: Diagnosis not present

## 2020-07-15 DIAGNOSIS — E039 Hypothyroidism, unspecified: Secondary | ICD-10-CM | POA: Diagnosis not present

## 2020-07-15 DIAGNOSIS — Z79899 Other long term (current) drug therapy: Secondary | ICD-10-CM | POA: Insufficient documentation

## 2020-07-15 LAB — BASIC METABOLIC PANEL
Anion gap: 8 (ref 5–15)
BUN: 26 mg/dL — ABNORMAL HIGH (ref 8–23)
CO2: 27 mmol/L (ref 22–32)
Calcium: 7.6 mg/dL — ABNORMAL LOW (ref 8.9–10.3)
Chloride: 99 mmol/L (ref 98–111)
Creatinine, Ser: 1.27 mg/dL — ABNORMAL HIGH (ref 0.44–1.00)
GFR, Estimated: 40 mL/min — ABNORMAL LOW (ref >60)
Glucose, Bld: 141 mg/dL — ABNORMAL HIGH (ref 70–99)
Potassium: 4.1 mmol/L (ref 3.5–5.1)
Sodium: 134 mmol/L — ABNORMAL LOW (ref 135–145)

## 2020-07-15 LAB — CBC WITH DIFFERENTIAL/PLATELET
Abs Immature Granulocytes: 0.04 10*3/uL (ref 0.00–0.07)
Basophils Absolute: 0.1 10*3/uL (ref 0.0–0.1)
Basophils Relative: 1 %
Eosinophils Absolute: 0 10*3/uL (ref 0.0–0.5)
Eosinophils Relative: 0 %
HCT: 26.4 % — ABNORMAL LOW (ref 36.0–46.0)
Hemoglobin: 7.8 g/dL — ABNORMAL LOW (ref 12.0–15.0)
Immature Granulocytes: 0 %
Lymphocytes Relative: 19 %
Lymphs Abs: 1.7 10*3/uL (ref 0.7–4.0)
MCH: 25.6 pg — ABNORMAL LOW (ref 26.0–34.0)
MCHC: 29.5 g/dL — ABNORMAL LOW (ref 30.0–36.0)
MCV: 86.6 fL (ref 80.0–100.0)
Monocytes Absolute: 0.7 10*3/uL (ref 0.1–1.0)
Monocytes Relative: 8 %
Neutro Abs: 6.5 10*3/uL (ref 1.7–7.7)
Neutrophils Relative %: 72 %
Platelets: 559 10*3/uL — ABNORMAL HIGH (ref 150–400)
RBC: 3.05 MIL/uL — ABNORMAL LOW (ref 3.87–5.11)
RDW: 17.5 % — ABNORMAL HIGH (ref 11.5–15.5)
WBC: 9.1 10*3/uL (ref 4.0–10.5)
nRBC: 0 % (ref 0.0–0.2)

## 2020-07-15 LAB — RESP PANEL BY RT-PCR (FLU A&B, COVID) ARPGX2
Influenza A by PCR: NEGATIVE
Influenza B by PCR: NEGATIVE
SARS Coronavirus 2 by RT PCR: NEGATIVE

## 2020-07-15 LAB — BRAIN NATRIURETIC PEPTIDE: B Natriuretic Peptide: 570.9 pg/mL — ABNORMAL HIGH (ref 0.0–100.0)

## 2020-07-15 LAB — TROPONIN I (HIGH SENSITIVITY): Troponin I (High Sensitivity): 17 ng/L (ref <18)

## 2020-07-15 MED ORDER — FUROSEMIDE 10 MG/ML IJ SOLN
40.0000 mg | Freq: Once | INTRAMUSCULAR | Status: AC
  Administered 2020-07-15: 40 mg via INTRAVENOUS
  Filled 2020-07-15: qty 4

## 2020-07-15 NOTE — Discharge Instructions (Addendum)
As we discussed your chest x-ray is concerning for developing CHF and I feel that you need to be evaluated in the emergency department and be given some Lasix to help pull off the fluid.

## 2020-07-15 NOTE — ED Triage Notes (Signed)
Pt states she has shortness of breath that started today, denies cough or fever. Pt states sob is worse with exertion. Pt states she has also had bilateral leg swelling since around christmas time. Pt takes lasix daily since January.

## 2020-07-15 NOTE — Discharge Instructions (Addendum)
Please seek medical attention for any high fevers, chest pain, shortness of breath, change in behavior, persistent vomiting, bloody stool or any other new or concerning symptoms.  

## 2020-07-15 NOTE — ED Provider Notes (Signed)
[facility]    CSN: [id number] Arrival date & time: 07/15/20  1743      History   Chief Complaint Chief Complaint  Patient presents with  . Shortness of Breath    HPI Lynn Rosario is a 87 y.o. female.   HPI   87 year old female here for evaluation of shortness of breath and bilateral leg swelling.  Patient reports that she developed her shortness of breath today and that increases with activity.  Patient states that she has not had a cough or chest pain.  Patient also has chronic lower extremity edema but it is worse today.  Patient was started on Lasix in December that was initially every other day but then she was changed to daily a month ago.  She took her last Lasix pill yesterday and has not taken one today because she is awaiting on a new prescription.  Patient states that she is voiding and that her urination has not decreased.  Past Medical History:  Diagnosis Date  . Anemia   . Hypertension   . Hypothyroidism   . Insomnia   . Lumbar radiculitis   . Lumbar spondylosis   . Lumbar stenosis with neurogenic claudication   . Stage III chronic kidney disease [facility])     Patient Active Problem List   Diagnosis Date Noted  . Iron deficiency anemia 05/12/2020  . Anemia due to stage 3 chronic kidney disease ([place]) 08/24/2016  . Anemia in chronic kidney disease 02/24/2015    Past Surgical History:  Procedure Laterality Date  . ABDOMINAL HYSTERECTOMY    . APPENDECTOMY    . TOTAL KNEE ARTHROPLASTY Left 2002    OB History   No obstetric history on file.      Home Medications    Prior to Admission medications   Medication Sig Start Date End Date Taking? Authorizing Provider  acetaminophen (TYLENOL) 325 MG tablet Take 650 mg by mouth daily after lunch.   Yes [provider]  ALPRAZolam (XANAX) 0.25 MG tablet TAKE (1) TABLET BY MOUTH TWICE DAILY AS NEEDED FOR SLEEP/ANXIETY 11/15/14  Yes [provider]  amLODipine (NORVASC) 2.5 MG  tablet  07/31/19 07/30/20 Yes [provider]  aspirin EC 81 MG tablet Take 81 mg by mouth daily.    Yes [provider]  Cetirizine HCl 10 MG CAPS Take 10 mg by mouth as needed.   Yes [provider]  ferrous sulfate 325 (65 FE) MG EC tablet Take by mouth daily.   Yes [provider]  furosemide (LASIX) 20 MG tablet Take 20 mg by mouth. Every other day 04/24/20 07/15/20 Yes [provider]  HYDROcodone-acetaminophen (NORCO/VICODIN) 5-325 MG per tablet 1 tablet 3 (three) times daily. 11/07/14  Yes [provider]  levothyroxine (SYNTHROID) 75 MCG tablet TAKE 1 TABLET ON AN EMPTY STOMACH WITH A GLASS OF WATER AT LEAST 30 TO 68 MINUTES BEFORE BREAKFAST. 04/23/11  Yes [provider]  Multiple Vitamin (MULTI-VITAMINS) TABS Take 1 tablet by mouth daily.    Yes [provider]  Cyanocobalamin 2500 MCG SUBL Place 1 tablet under the tongue daily.  Patient not taking: Reported on 07/02/2020    [provider]  sodium bicarbonate 650 MG tablet Take 650 mg by mouth 2 (two) times daily.    [provider]    Family History Family History  Problem Relation Age of Onset  . Stroke Mother   . CAD Father   . Stomach cancer Sister   .  Breast cancer Sister   . Breast cancer Sister   . Breast cancer Daughter     Social History Social History   Tobacco Use  . Smoking status: Never Smoker  . Smokeless tobacco: Never Used  Vaping Use  . Vaping Use: Never used  Substance Use Topics  . Alcohol use: No    Alcohol/week: 0.0 standard drinks  . Drug use: No     Allergies   Celecoxib and Clarithromycin   Review of Systems Review of Systems  Constitutional: Negative for activity change and appetite change.  Respiratory: Positive for shortness of breath. Negative for cough and wheezing.   Cardiovascular: Positive for leg swelling. Negative for chest pain and palpitations.  Skin: Negative for color change.   Hematological: Negative.   Psychiatric/Behavioral: Negative.      Physical Exam Triage Vital Signs ED Triage Vitals  Enc Vitals Group     BP 07/15/20 1820 (!) 169/64     Pulse Rate 07/15/20 1820 73     Resp 07/15/20 1820 (!) 22     Temp 07/15/20 1820 97.8 F (36.6 C)     Temp Source 07/15/20 1820 Oral     SpO2 07/15/20 1820 99 %     Weight --      Height --      Head Circumference --      Peak Flow --      Pain Score 07/15/20 1826 0     Pain Loc --      Pain Edu? --      Excl. in [place]? --    No data found.  Updated Vital Signs BP (!) 169/64 (BP Location: Left Arm)   Pulse 73   Temp 97.8 F (36.6 C) (Oral)   Resp (!) 22   SpO2 99%   Visual Acuity Right Eye Distance:   Left Eye Distance:   Bilateral Distance:    Right Eye Near:   Left Eye Near:    Bilateral Near:     Physical Exam Vitals and nursing note reviewed.  Constitutional:      General: She is not in acute distress.    Appearance: She is well-developed. She is ill-appearing.  HENT:     Head: Normocephalic.  Cardiovascular:     Rate and Rhythm: Normal rate and regular rhythm.     Heart sounds: Normal heart sounds.  Pulmonary:     Effort: Pulmonary effort is normal. No tachypnea.     Breath sounds: Normal breath sounds. No decreased breath sounds, wheezing, rhonchi or rales.  Musculoskeletal:     Right lower leg: Edema present.     Left lower leg: Edema present.  Skin:    General: Skin is warm and dry.     Capillary Refill: Capillary refill takes less than 2 seconds.     Findings: No erythema or rash.  Neurological:     General: No focal deficit present.     Mental Status: She is alert and oriented to person, place, and time.  Psychiatric:        Mood and Affect: Mood normal.        Behavior: Behavior normal.      UC Treatments / Results  Labs (all labs ordered are listed, but only abnormal results are displayed) Labs Reviewed - No data to display  EKG   Radiology DG Chest 2  View  Result Date: 07/15/2020 CLINICAL DATA:  Shortness of breath lower extremity edema in EXAM: CHEST - 2 VIEW COMPARISON:  10/26/2017 (report only, no images) FINDINGS: Increased AP diameter of the chest with hyperinflation and a background of coarsened interstitial and bronchitic changes. Small bilateral effusions are noted. Some additional patchy opacity present in the left lung base, could reflect layering fluid, atelectasis or edema. No pneumothorax. Large hiatal hernia is noted. Cardiac silhouette is enlarged. Tortuous and calcified aorta. Degenerative changes are present in the imaged spine and shoulders. Exaggerated thoracic kyphosis with midthoracic vertebral body height loss, difficult to fully visualize given osseous demineralization and processes in the lung bases. IMPRESSION: 1. Cardiomegaly and bilateral effusions, some additional opacity in the left lung base, could reflect layering fluid, atelectasis or edema. Correlate for clinical features of CHF. 2. Large hiatal hernia. 3. Exaggerated thoracic kyphosis with midthoracic compression deformities, age indeterminate and incompletely assessed on this exam. Electronically Signed   By: Lovena Le M.D.   On: 07/15/2020 19:35    Procedures Procedures (including critical care time)  Medications Ordered in UC Medications - No data to display  Initial Impression / Assessment and Plan / UC Course  I have reviewed the triage vital signs and the nursing notes.  Pertinent labs & imaging results that were available during my care of the patient were reviewed by me and considered in my medical decision making (see chart for details).   Patient is a very pleasant, ill-appearing 87 year old female here with her daughter for evaluation of worsening leg swelling and shortness of breath.  The leg swelling is not new but it has increased since yesterday.  Patient has been on daily Lasix but she took her last pill yesterday and is awaiting a refill.   Patient's heart sounds are normal, lung sounds are free of rales in all fields.  Patient's lower extremities have 3+ pitting edema to the level of mid thigh.  Patient supposed be wearing compression hose but she is unable to get them on.  Patient does have on her regular house and they are very taut.  Patient's last echocardiogram was July 2019 which showed an ejection fraction of 55%.  She does have mild mitral valve regurgitation and severe tricuspid valve regurgitation.  Patient also had mild aortic valve stenosis and moderate aortic valve regurgitation at that time.  Patient's exam is concerning for developing CHF.  Will obtain chest x-ray.  Radiology interpretation of chest x-ray is worsening bilateral effusions with increased opacity on the left.  Given patient's increasing edema and shortness of breath I feel that this is indicating congestive heart failure.  Discussed this with patient and advised her that she needs to be seen in the emergency department at [facility].  She has elected to go by POV.  Report called to Rachal the charge nurse in the emergency department at [facility].  Final Clinical Impressions(s) / UC Diagnoses   Final diagnoses:  SOB (shortness of breath)  Congestive heart failure, unspecified HF chronicity, unspecified heart failure type [facility])     Discharge Instructions     As we discussed your chest x-ray is concerning for developing CHF and I feel that you need to be evaluated in the emergency department and be given some Lasix to help pull off the fluid.    ED Prescriptions    None     PDMP not reviewed this encounter.   Margarette Canada, NP 07/15/20 1945

## 2020-07-15 NOTE — ED Notes (Signed)
purewick placed on pt.

## 2020-07-15 NOTE — ED Triage Notes (Signed)
Pt presents with SOB starting today and worsening throughout the day.  Some while sitting but worse with activity. Denies coughing, CP.  Bilateral leg is chronic but worsening in upper legs Bilateral pitting edema noted.  Is on Lasix daily.    Is taking an antibiotic for 'burning and itching' but is not sure of name.  Is supposed to be taken over 5 days.

## 2020-07-15 NOTE — ED Provider Notes (Signed)
[facility] Emergency Department Provider Note   ____________________________________________   I have reviewed the triage vital signs and the nursing notes.   HISTORY  Chief Complaint Shortness of Breath   History limited by: Not Limited   HPI Lynn Rosario is a 87 y.o. female who presents to the emergency department today from urgent care because of concerns for shortness of breath.  Patient states that the shortness of breath started today.  Has been bad throughout the day.  She does have a history of fluid overload and is on Lasix.  She does occasionally get shortness of breath that comes and goes but today was different because it was persistent.  She denies any associated chest pain.  No cough.  Denies any recent fevers.  She has noticed some increased swelling to her lower extremities today.  Denies missing any recent doses of Lasix.  Records reviewed. Per medical record review patient has a history of HTN, CKD.   Past Medical History:  Diagnosis Date  . Anemia   . Hypertension   . Hypothyroidism   . Insomnia   . Lumbar radiculitis   . Lumbar spondylosis   . Lumbar stenosis with neurogenic claudication   . Stage III chronic kidney disease [facility])     Patient Active Problem List   Diagnosis Date Noted  . Iron deficiency anemia 05/12/2020  . Anemia due to stage 3 chronic kidney disease ([place]) 08/24/2016  . Anemia in chronic kidney disease 02/24/2015    Past Surgical History:  Procedure Laterality Date  . ABDOMINAL HYSTERECTOMY    . APPENDECTOMY    . TOTAL KNEE ARTHROPLASTY Left 2002    Prior to Admission medications   Medication Sig Start Date End Date Taking? Authorizing Provider  acetaminophen (TYLENOL) 325 MG tablet Take 650 mg by mouth daily after lunch.    [provider]  ALPRAZolam (XANAX) 0.25 MG tablet TAKE (1) TABLET BY MOUTH TWICE DAILY AS NEEDED FOR SLEEP/ANXIETY 11/15/14   [provider]  amLODipine  (NORVASC) 2.5 MG tablet  07/31/19 07/30/20  [provider]  aspirin EC 81 MG tablet Take 81 mg by mouth daily.     [provider]  Cetirizine HCl 10 MG CAPS Take 10 mg by mouth as needed.    [provider]  Cyanocobalamin 2500 MCG SUBL Place 1 tablet under the tongue daily.  Patient not taking: Reported on 07/02/2020    [provider]  ferrous sulfate 325 (65 FE) MG EC tablet Take by mouth daily.    [provider]  furosemide (LASIX) 20 MG tablet Take 20 mg by mouth. Every other day 04/24/20 07/15/20  [provider]  HYDROcodone-acetaminophen (NORCO/VICODIN) 5-325 MG per tablet 1 tablet 3 (three) times daily. 11/07/14   [provider]  levothyroxine (SYNTHROID) 75 MCG tablet TAKE 1 TABLET ON AN EMPTY STOMACH WITH A GLASS OF WATER AT LEAST 30 TO 104 MINUTES BEFORE BREAKFAST. 04/23/11   [provider]  Multiple Vitamin (MULTI-VITAMINS) TABS Take 1 tablet by mouth daily.     [provider]  sodium bicarbonate 650 MG tablet Take 650 mg by mouth 2 (two) times daily.    [provider]    Allergies Celecoxib and Clarithromycin  Family History  Problem Relation Age of Onset  . Stroke Mother   . CAD Father   . Stomach cancer Sister   . Breast cancer Sister   . Breast cancer Sister   . Breast cancer Daughter  Social History Social History   Tobacco Use  . Smoking status: Never Smoker  . Smokeless tobacco: Never Used  Vaping Use  . Vaping Use: Never used  Substance Use Topics  . Alcohol use: No    Alcohol/week: 0.0 standard drinks  . Drug use: No    Review of Systems Constitutional: No fever/chills Eyes: No visual changes. ENT: No sore throat. Cardiovascular: Denies chest pain. Respiratory: Positive for shortness of breath. Gastrointestinal: No abdominal pain.  No nausea, no vomiting.  No diarrhea.   Genitourinary: Negative for dysuria. Musculoskeletal: Positive for lower extremity  edema.  Skin: Negative for rash. Neurological: Negative for headaches, focal weakness or numbness.  ____________________________________________   PHYSICAL EXAM:  VITAL SIGNS: ED Triage Vitals  Enc Vitals Group     BP 07/15/20 2037 (!) 155/61     Pulse Rate 07/15/20 2037 74     Resp 07/15/20 2037 20     Temp 07/15/20 2037 98.5 F (36.9 C)     Temp Source 07/15/20 2037 Oral     SpO2 07/15/20 2037 99 %     Weight 07/15/20 2035 104 lb (47.2 kg)     Height 07/15/20 2035 5\' 2"  (1.575 m)     Head Circumference --      Peak Flow --      Pain Score 07/15/20 2034 0   Constitutional: Alert and oriented.  Eyes: Conjunctivae are normal.  ENT      Head: Normocephalic and atraumatic.      Nose: No congestion/rhinnorhea.      Mouth/Throat: Mucous membranes are moist.      Neck: No stridor. Hematological/Lymphatic/Immunilogical: No cervical lymphadenopathy. Cardiovascular: Normal rate, regular rhythm.  No murmurs, rubs, or gallops.  Respiratory: Normal respiratory effort without tachypnea nor retractions. Breath sounds are clear and equal bilaterally. No wheezes/rales/rhonchi. Gastrointestinal: Soft and non tender. No rebound. No guarding.  Rectal:  GUIAC negative Musculoskeletal: Normal range of motion in all extremities. Positive for bilateral lower extremity edema.  Neurologic:  Normal speech and language. No gross focal neurologic deficits are appreciated.  Skin:  Skin is warm, dry and intact. No rash noted. Psychiatric: Mood and affect are normal. Speech and behavior are normal. Patient exhibits appropriate insight and judgment.  ____________________________________________    LABS (pertinent positives/negatives)  Trop hs 17 BNP 570.9 CBC wbc 9.1, hgb 7.8, plt 559 BMP na 134, k 4.1, glu 141, cr 1.27 COVID negative  ____________________________________________   EKG  I, Nance Pear, attending physician, personally viewed and interpreted this EKG  EKG Time:  2059 Rate: 74 Rhythm: sinus rhythm Axis: normal Intervals: qtc 455 QRS: narrow, q waves v1 ST changes: no st elevation Impression: abnormal ekg   ____________________________________________    RADIOLOGY  CXR from urgent care reviewed  ____________________________________________   PROCEDURES  Procedures  ____________________________________________   INITIAL IMPRESSION / ASSESSMENT AND PLAN / ED COURSE  Pertinent labs & imaging results that were available during my care of the patient were reviewed by me and considered in my medical decision making (see chart for details).   Patient presented to the emergency department today because of concerns for shortness of breath.  Patient's work-up here and chest x-ray obtained at urgent care is consistent with fluid overload.  I discussed this with the patient.  Will give patient dose of Lasix here for diuresis.  I do think if patient feels improvement she can likely go home to follow-up with outpatient doctors.  Additionally patient's blood work shows worsening anemia.  She was guaiac negative today.  Apparently she has required iron transfusions in the past.  Given no active bleeding I do think this can also be followed up as an outpatient.  ____________________________________________   FINAL CLINICAL IMPRESSION(S) / ED DIAGNOSES  Final diagnoses:  SOB (shortness of breath)  Congestive heart failure, unspecified HF chronicity, unspecified heart failure type ([place])  Anemia, unspecified type     Note: This dictation was prepared with Dragon dictation. Any transcriptional errors that result from this process are unintentional     Nance Pear, MD 07/15/20 2236

## 2020-07-15 NOTE — ED Notes (Signed)
Patient is being discharged from the Urgent Care and sent to the Emergency Department via POV . Per Charmayne Sheer NP, patient is in need of higher level of care due to further eval. Patient is aware and verbalizes understanding of plan of care.  Vitals:   07/15/20 1820  BP: (!) 169/64  Pulse: 73  Resp: (!) 22  Temp: 97.8 F (36.6 C)  SpO2: 99%

## 2020-07-16 ENCOUNTER — Telehealth: Payer: Self-pay | Admitting: Hematology and Oncology

## 2020-07-16 LAB — URINALYSIS, COMPLETE (UACMP) WITH MICROSCOPIC
Bacteria, UA: NONE SEEN
Bilirubin Urine: NEGATIVE
Glucose, UA: NEGATIVE mg/dL
Hgb urine dipstick: NEGATIVE
Ketones, ur: NEGATIVE mg/dL
Leukocytes,Ua: NEGATIVE
Nitrite: NEGATIVE
Protein, ur: NEGATIVE mg/dL
Specific Gravity, Urine: 1.006 (ref 1.005–1.030)
WBC, UA: NONE SEEN WBC/hpf (ref 0–5)
pH: 9 — ABNORMAL HIGH (ref 5.0–8.0)

## 2020-07-16 LAB — TROPONIN I (HIGH SENSITIVITY): Troponin I (High Sensitivity): 15 ng/L (ref <18)

## 2020-07-16 MED ORDER — CALCIUM GLUCONATE-NACL 1-0.675 GM/50ML-% IV SOLN
1.0000 g | Freq: Once | INTRAVENOUS | Status: AC
  Administered 2020-07-16: 1000 mg via INTRAVENOUS
  Filled 2020-07-16: qty 50

## 2020-07-16 NOTE — Telephone Encounter (Signed)
07/16/2020 Pt's daughter called and said they ended up taking her to the ED last night for shortness of breath and swelling in the legs. They did blood work and she was told pt's hemoglobin was 7.8. She wants to know if Dr. Loletha Grayer would like for her to come in sooner; pt is already scheduled for 07/29/20 for lab/MD/iron infusion. Please advise SRW

## 2020-07-16 NOTE — ED Notes (Signed)
Brief changed, purewick changed, peri-care completed.

## 2020-07-16 NOTE — Telephone Encounter (Signed)
  I can see her.  She won't be able to get blood tomorrow, just IV iron.    Should would need to be typed and crossed first.  If she needs blood, she will need to go to [place] on Friday.  M

## 2020-07-16 NOTE — ED Provider Notes (Signed)
Received in signout from Dr. Archie Balboa pending reassessment after dose of Lasix.  Patient has had adequate diuresis she feels much improved.  She is able to ambulate with steady gait without any hypoxia.  Noted to have mild hypocalcemia therefore was given a dose here and discussed with patient and family member at bedside oral supplementation importance of follow-up regarding low calcium with PCP as well as nephrology.  Patient also has follow-up as an outpatient for iron infusion.  Appears well and appropriate for outpatient follow-up.   Lynn Lot, MD 07/16/20 [phone]

## 2020-07-16 NOTE — Telephone Encounter (Signed)
07/16/2020 Called pt's daughter and informed her of appts tomorrow @ 9:30 for labs and possible transfusion or IV iron per Dr. Loletha Grayer. Daughter confirmed appts SRW

## 2020-07-16 NOTE — Telephone Encounter (Signed)
  Please call patient's daughter.  We have been trying to get her in for awhile to receive treatment.  Consider transfusion and/or initiation of IV iron today or tomorrow.  Dates were putt off per her request.  M

## 2020-07-17 ENCOUNTER — Encounter: Payer: Self-pay | Admitting: Hematology and Oncology

## 2020-07-17 ENCOUNTER — Inpatient Hospital Stay (HOSPITAL_BASED_OUTPATIENT_CLINIC_OR_DEPARTMENT_OTHER): Payer: Medicare Other | Admitting: Hematology and Oncology

## 2020-07-17 ENCOUNTER — Other Ambulatory Visit: Payer: Self-pay

## 2020-07-17 ENCOUNTER — Inpatient Hospital Stay: Payer: Medicare Other

## 2020-07-17 VITALS — BP 160/54 | HR 66 | Temp 95.2°F

## 2020-07-17 VITALS — BP 152/53 | HR 64 | Temp 98.1°F | Resp 18

## 2020-07-17 DIAGNOSIS — D631 Anemia in chronic kidney disease: Secondary | ICD-10-CM

## 2020-07-17 DIAGNOSIS — N1832 Chronic kidney disease, stage 3b: Secondary | ICD-10-CM | POA: Diagnosis not present

## 2020-07-17 DIAGNOSIS — D509 Iron deficiency anemia, unspecified: Secondary | ICD-10-CM | POA: Diagnosis not present

## 2020-07-17 LAB — CBC WITH DIFFERENTIAL/PLATELET
Abs Immature Granulocytes: 0.06 10*3/uL (ref 0.00–0.07)
Basophils Absolute: 0 10*3/uL (ref 0.0–0.1)
Basophils Relative: 0 %
Eosinophils Absolute: 0.1 10*3/uL (ref 0.0–0.5)
Eosinophils Relative: 1 %
HCT: 24.4 % — ABNORMAL LOW (ref 36.0–46.0)
Hemoglobin: 7.4 g/dL — ABNORMAL LOW (ref 12.0–15.0)
Immature Granulocytes: 1 %
Lymphocytes Relative: 18 %
Lymphs Abs: 1.8 10*3/uL (ref 0.7–4.0)
MCH: 26.2 pg (ref 26.0–34.0)
MCHC: 30.3 g/dL (ref 30.0–36.0)
MCV: 86.5 fL (ref 80.0–100.0)
Monocytes Absolute: 0.7 10*3/uL (ref 0.1–1.0)
Monocytes Relative: 7 %
Neutro Abs: 7.4 10*3/uL (ref 1.7–7.7)
Neutrophils Relative %: 73 %
Platelets: 608 10*3/uL — ABNORMAL HIGH (ref 150–400)
RBC: 2.82 MIL/uL — ABNORMAL LOW (ref 3.87–5.11)
RDW: 17.8 % — ABNORMAL HIGH (ref 11.5–15.5)
WBC: 10.2 10*3/uL (ref 4.0–10.5)
nRBC: 0 % (ref 0.0–0.2)

## 2020-07-17 LAB — SAMPLE TO BLOOD BANK

## 2020-07-17 LAB — FERRITIN: Ferritin: 25 ng/mL (ref 11–307)

## 2020-07-17 MED ORDER — IRON SUCROSE 20 MG/ML IV SOLN
200.0000 mg | Freq: Once | INTRAVENOUS | Status: AC
  Administered 2020-07-17: 200 mg via INTRAVENOUS
  Filled 2020-07-17: qty 10

## 2020-07-17 MED ORDER — SODIUM CHLORIDE 0.9 % IV SOLN
Freq: Once | INTRAVENOUS | Status: AC
  Filled 2020-07-17: qty 250

## 2020-07-17 MED ORDER — SODIUM CHLORIDE 0.9 % IV SOLN: 200.0000 mg | Freq: Once | INTRAVENOUS | Status: DC

## 2020-07-17 NOTE — Progress Notes (Signed)
Pt received prescribed treatment in clinic, pt stable at d/c. 

## 2020-07-17 NOTE — Telephone Encounter (Signed)
Please see attached message in reference to patient sent in secure chat. Thank you

## 2020-07-17 NOTE — Progress Notes (Signed)
Patient reports no energy 

## 2020-07-17 NOTE — Progress Notes (Signed)
[facility]  [address] [place], [place] [address] Phone: [phone]  Fax: [phone]   Clinic Day:  07/17/2020  Referring physician: Sherrin Daisy, MD  Chief Complaint: Lynn Rosario is a 87 y.o. female with anemia of stage 3b chronic kidney disease and iron deficiency who is seen for 2 week assessment.  HPI: The patient was last seen in the hematology clinic on 07/02/2020 via telemedicine. At that time, she felt "pretty good." Her main problem was her arthritis.  She denied any melena, hematochezia, hematuria or vaginal bleeding.  The patient went to [facility] on 07/15/2020 for shortness of breath and worsening leg swelling. CXR revealed cardiomegaly and bilateral effusions, some additional opacity in the left lung base, could reflect layering fluid, atelectasis or edema. Correlate for clinical features of CHF. There was a large hiatal hernia. There was exaggerated thoracic kyphosis with midthoracic compression deformities, age indeterminate and incompletely assessed on this exam. She went to the [facility] ER.  She was seen in the [facility] ER on 07/15/2020. Hematocrit 26.4, hemoglobin 7.8. Her work-up was consistent with fluid overload. She was given a dose of Lasix for diuresis.  During the interim, she has been "ok". Her leg swelling has improved since she received Lasix. Her shortness of breath has improved. She is able to get around a bit better. She denies blood in the stool, black stools, and hematuria.  The patient does not eat a lot of iron rich foods. She eats a lot of cereal. She takes oral iron BID. She ate better yesterday because she was staying with [name].   She has missed several appointments due to diarrhea, not feeling good, and the appointments being too early in the morning.   Past Medical History:  Diagnosis Date  . Anemia   . Hypertension   . Hypothyroidism   . Insomnia   . Lumbar radiculitis   . Lumbar spondylosis    . Lumbar stenosis with neurogenic claudication   . Stage III chronic kidney disease [facility])     Past Surgical History:  Procedure Laterality Date  . ABDOMINAL HYSTERECTOMY    . APPENDECTOMY    . TOTAL KNEE ARTHROPLASTY Left 2002    Family History  Problem Relation Age of Onset  . Stroke Mother   . CAD Father   . Stomach cancer Sister   . Breast cancer Sister   . Breast cancer Sister   . Breast cancer Daughter   The patient denies a family history of blood disorders or kidney diseases.   Social History:  reports that she has never smoked. She has never used smokeless tobacco. She reports that she does not drink alcohol and does not use drugs. She does not use drugs or drink alcohol. She denies any exposure to radiation or toxins. She has not worked in 15-17 years. She worked in a [employer], engineering. The patient lives in [place]. She lives three doors down from her daughter and POA, [name]. The patient is accompanied by [name] today.  Allergies:  Allergies  Allergen Reactions  . Celecoxib Other (See Comments)    Decreased kidney function   . Clarithromycin Other (See Comments)    Loss of taste     Current Medications: Current Outpatient Medications  Medication Sig Dispense Refill  . acetaminophen (TYLENOL) 325 MG tablet Take 650 mg by mouth daily after lunch.    Marland Kitchen amLODipine (NORVASC) 2.5 MG tablet     . aspirin EC 81  MG tablet Take 81 mg by mouth daily.     . Cetirizine HCl 10 MG CAPS Take 10 mg by mouth as needed.    . ferrous sulfate 325 (65 FE) MG EC tablet Take by mouth daily.    Marland Kitchen HYDROcodone-acetaminophen (NORCO/VICODIN) 5-325 MG per tablet 1 tablet 3 (three) times daily.    Marland Kitchen levothyroxine (SYNTHROID) 75 MCG tablet TAKE 1 TABLET ON AN EMPTY STOMACH WITH A GLASS OF WATER AT LEAST 30 TO 60 MINUTES BEFORE BREAKFAST.    . Multiple Vitamin (MULTI-VITAMINS) TABS Take 1 tablet by mouth daily.     . sodium bicarbonate 650 MG tablet Take 650 mg by mouth 2 (two)  times daily.    Marland Kitchen ALPRAZolam (XANAX) 0.25 MG tablet TAKE (1) TABLET BY MOUTH TWICE DAILY AS NEEDED FOR SLEEP/ANXIETY (Patient not taking: Reported on 07/17/2020)    . Cyanocobalamin 2500 MCG SUBL Place 1 tablet under the tongue daily.  (Patient not taking: No sig reported)    . furosemide (LASIX) 20 MG tablet Take 20 mg by mouth. Every other day     No current facility-administered medications for this visit.    Review of Systems  Constitutional: Positive for weight loss (down 11 lbs since 05/13/2020). Negative for chills, diaphoresis, fever and malaise/fatigue.  HENT: Negative for congestion, ear discharge, ear pain, hearing loss, nosebleeds, sinus pain, sore throat and tinnitus.   Eyes: Negative for blurred vision.  Respiratory: Positive for shortness of breath (on exertion). Negative for cough, hemoptysis and sputum production.   Cardiovascular: Positive for leg swelling (improved). Negative for chest pain and palpitations.  Gastrointestinal: Positive for diarrhea (occasional). Negative for abdominal pain, blood in stool, constipation, heartburn, melena, nausea and vomiting.  Genitourinary: Negative for dysuria (burning), frequency, hematuria and urgency.  Musculoskeletal: Positive for back pain (due to arthritis and scoliosis, wears a brace) and joint pain (knees). Negative for myalgias and neck pain.  Skin: Negative for itching and rash.  Neurological: Negative for dizziness, tingling, sensory change, weakness and headaches.  Endo/Heme/Allergies: Negative for environmental allergies. Does not bruise/bleed easily.  Psychiatric/Behavioral: Negative for depression and memory loss. The patient is not nervous/anxious (takes Xanax at night) and does not have insomnia.   All other systems reviewed and are negative.  Performance status (ECOG): 2  Vitals Blood pressure (!) 160/54, pulse 66, temperature (!) 95.2 F (35.1 C).   Physical Exam Vitals and nursing note reviewed.  Constitutional:       General: She is not in acute distress.    Appearance: She is not diaphoretic.     Comments: Patient sitting comfortably in wheelchair. Cane by her side. Examined in wheelchair.  HENT:     Head: Normocephalic and atraumatic.     Mouth/Throat:     Mouth: Mucous membranes are moist.     Pharynx: Oropharynx is clear.  Eyes:     General: No scleral icterus.    Extraocular Movements: Extraocular movements intact.     Conjunctiva/sclera: Conjunctivae normal.     Pupils: Pupils are equal, round, and reactive to light.  Cardiovascular:     Rate and Rhythm: Normal rate and regular rhythm.     Heart sounds: Normal heart sounds. No murmur heard.   Pulmonary:     Effort: Pulmonary effort is normal. No respiratory distress.     Breath sounds: Normal breath sounds. No wheezing or rales.  Chest:     Chest wall: No tenderness.  Breasts:     Right: No axillary adenopathy or  supraclavicular adenopathy.     Left: No axillary adenopathy or supraclavicular adenopathy.    Abdominal:     General: Bowel sounds are normal. There is no distension.     Palpations: Abdomen is soft. There is no mass.     Tenderness: There is no abdominal tenderness. There is no guarding or rebound.  Musculoskeletal:        General: Tenderness (BLE) present. No swelling. Normal range of motion.     Cervical back: Normal range of motion and neck supple.     Right lower leg: Edema present.     Left lower leg: Edema present.  Lymphadenopathy:     Head:     Right side of head: No preauricular, posterior auricular or occipital adenopathy.     Left side of head: No preauricular, posterior auricular or occipital adenopathy.     Cervical: No cervical adenopathy.     Upper Body:     Right upper body: No supraclavicular or axillary adenopathy.     Left upper body: No supraclavicular or axillary adenopathy.     Lower Body: No right inguinal adenopathy. No left inguinal adenopathy.  Skin:    General: Skin is warm and dry.   Neurological:     Mental Status: She is alert and oriented to person, place, and time.  Psychiatric:        Behavior: Behavior normal.        Thought Content: Thought content normal.        Judgment: Judgment normal.    Appointment on 07/17/2020  Component Date Value Ref Range Status  . Ferritin 07/17/2020 25  11 - 307 ng/mL Final   Performed at [facility], [address] [place], [place] [address]  . WBC 07/17/2020 10.2  4.0 - 10.5 K/uL Final  . RBC 07/17/2020 2.82* 3.87 - 5.11 MIL/uL Final  . Hemoglobin 07/17/2020 7.4* 12.0 - 15.0 g/dL Final  . HCT 07/17/2020 24.4* 36.0 - 46.0 % Final  . MCV 07/17/2020 86.5  80.0 - 100.0 fL Final  . MCH 07/17/2020 26.2  26.0 - 34.0 pg Final  . MCHC 07/17/2020 30.3  30.0 - 36.0 g/dL Final  . RDW 07/17/2020 17.8* 11.5 - 15.5 % Final  . Platelets 07/17/2020 608* 150 - 400 K/uL Final  . nRBC 07/17/2020 0.0  0.0 - 0.2 % Final  . Neutrophils Relative % 07/17/2020 73  % Final  . Neutro Abs 07/17/2020 7.4  1.7 - 7.7 K/uL Final  . Lymphocytes Relative 07/17/2020 18  % Final  . Lymphs Abs 07/17/2020 1.8  0.7 - 4.0 K/uL Final  . Monocytes Relative 07/17/2020 7  % Final  . Monocytes Absolute 07/17/2020 0.7  0.1 - 1.0 K/uL Final  . Eosinophils Relative 07/17/2020 1  % Final  . Eosinophils Absolute 07/17/2020 0.1  0.0 - 0.5 K/uL Final  . Basophils Relative 07/17/2020 0  % Final  . Basophils Absolute 07/17/2020 0.0  0.0 - 0.1 K/uL Final  . Immature Granulocytes 07/17/2020 1  % Final  . Abs Immature Granulocytes 07/17/2020 0.06  0.00 - 0.07 K/uL Final   Performed at [facility], [address], [place], [place] [address]  Admission on 07/15/2020, Discharged on 07/16/2020  Component Date Value Ref Range Status  . WBC 07/15/2020 9.1  4.0 - 10.5 K/uL Final  . RBC 07/15/2020 3.05* 3.87 - 5.11 MIL/uL Final  . Hemoglobin 07/15/2020 7.8* 12.0 - 15.0 g/dL Final  . HCT 07/15/2020 26.4* 36.0 - 46.0 %  Final  . MCV 07/15/2020 86.6  80.0 -  100.0 fL Final  . MCH 07/15/2020 25.6* 26.0 - 34.0 pg Final  . MCHC 07/15/2020 29.5* 30.0 - 36.0 g/dL Final  . RDW 07/15/2020 17.5* 11.5 - 15.5 % Final  . Platelets 07/15/2020 559* 150 - 400 K/uL Final  . nRBC 07/15/2020 0.0  0.0 - 0.2 % Final  . Neutrophils Relative % 07/15/2020 72  % Final  . Neutro Abs 07/15/2020 6.5  1.7 - 7.7 K/uL Final  . Lymphocytes Relative 07/15/2020 19  % Final  . Lymphs Abs 07/15/2020 1.7  0.7 - 4.0 K/uL Final  . Monocytes Relative 07/15/2020 8  % Final  . Monocytes Absolute 07/15/2020 0.7  0.1 - 1.0 K/uL Final  . Eosinophils Relative 07/15/2020 0  % Final  . Eosinophils Absolute 07/15/2020 0.0  0.0 - 0.5 K/uL Final  . Basophils Relative 07/15/2020 1  % Final  . Basophils Absolute 07/15/2020 0.1  0.0 - 0.1 K/uL Final  . Immature Granulocytes 07/15/2020 0  % Final  . Abs Immature Granulocytes 07/15/2020 0.04  0.00 - 0.07 K/uL Final   Performed at [facility], [address], [place], [place] [address]  . Sodium 07/15/2020 134* 135 - 145 mmol/L Final  . Potassium 07/15/2020 4.1  3.5 - 5.1 mmol/L Final  . Chloride 07/15/2020 99  98 - 111 mmol/L Final  . CO2 07/15/2020 27  22 - 32 mmol/L Final  . Glucose, Bld 07/15/2020 141* 70 - 99 mg/dL Final   Glucose reference range applies only to samples taken after fasting for at least 8 hours.  . BUN 07/15/2020 26* 8 - 23 mg/dL Final  . Creatinine, Ser 07/15/2020 1.27* 0.44 - 1.00 mg/dL Final  . Calcium 07/15/2020 7.6* 8.9 - 10.3 mg/dL Final  . GFR, Estimated 07/15/2020 40* >60 mL/min Final   Comment: (NOTE) Calculated using the CKD-EPI Creatinine Equation (2021)   . Anion gap 07/15/2020 8  5 - 15 Final   Performed at [facility], [address] [place], [place] [address]  . Troponin I (High Sensitivity) 07/15/2020 17  <18 ng/L Final   Comment: (NOTE) Elevated high sensitivity troponin I (hsTnI) values and significant  changes across serial measurements may suggest ACS but many other   chronic and acute conditions are known to elevate hsTnI results.  Refer to the "Links" section for chest pain algorithms and additional  guidance. Performed at [facility], [address] [place], [place] [address]   . B Natriuretic Peptide 07/15/2020 570.9* 0.0 - 100.0 pg/mL Final   Performed at [facility], [address] [place], [place] [address]  . SARS Coronavirus 2 by RT PCR 07/15/2020 NEGATIVE  NEGATIVE Final   Comment: (NOTE) SARS-CoV-2 target nucleic acids are NOT DETECTED.  The SARS-CoV-2 RNA is generally detectable in upper respiratory specimens during the acute phase of infection. The lowest concentration of SARS-CoV-2 viral copies this assay can detect is 138 copies/mL. A negative result does not preclude SARS-Cov-2 infection and should not be used as the sole basis for treatment or other patient management decisions. A negative result may occur with  improper specimen collection/handling, submission of specimen other than nasopharyngeal swab, presence of viral mutation(s) within the areas targeted by this assay, and inadequate number of viral copies(<138 copies/mL). A negative result must be combined with clinical observations, patient history, and epidemiological information. The expected result is Negative.  Fact Sheet for Patients:  EntrepreneurPulse.com.au  Fact Sheet for Healthcare  Providers:  IncredibleEmployment.be  This test is no                          t yet approved or cleared by the [place] and  has been authorized for detection and/or diagnosis of SARS-CoV-2 by FDA under an Emergency Use Authorization (EUA). This EUA will remain  in effect (meaning this test can be used) for the duration of the COVID-19 declaration under Section 564(b)(1) of the Act, 21 U.S.C.section 360bbb-3(b)(1), unless the authorization is terminated  or revoked sooner.      . Influenza A by PCR 07/15/2020  NEGATIVE  NEGATIVE Final  . Influenza B by PCR 07/15/2020 NEGATIVE  NEGATIVE Final   Comment: (NOTE) The Xpert Xpress SARS-CoV-2/FLU/RSV plus assay is intended as an aid in the diagnosis of influenza from Nasopharyngeal swab specimens and should not be used as a sole basis for treatment. Nasal washings and aspirates are unacceptable for Xpert Xpress SARS-CoV-2/FLU/RSV testing.  Fact Sheet for Patients: EntrepreneurPulse.com.au  Fact Sheet for Healthcare Providers: IncredibleEmployment.be  This test is not yet approved or cleared by the [place] FDA and has been authorized for detection and/or diagnosis of SARS-CoV-2 by FDA under an Emergency Use Authorization (EUA). This EUA will remain in effect (meaning this test can be used) for the duration of the COVID-19 declaration under Section 564(b)(1) of the Act, 21 U.S.C. section 360bbb-3(b)(1), unless the authorization is terminated or revoked.  Performed at [facility], [address] [place], [place] [address]   . Troponin I (High Sensitivity) 07/15/2020 15  <18 ng/L Final   Comment: (NOTE) Elevated high sensitivity troponin I (hsTnI) values and significant  changes across serial measurements may suggest ACS but many other  chronic and acute conditions are known to elevate hsTnI results.  Refer to the "Links" section for chest pain algorithms and additional  guidance. Performed at [facility], [address], [place], [place] [address]   . Color, Urine 07/15/2020 STRAW* YELLOW Final  . APPearance 07/15/2020 CLEAR* CLEAR Final  . Specific Gravity, Urine 07/15/2020 1.006  1.005 - 1.030 Final  . pH 07/15/2020 9.0* 5.0 - 8.0 Final  . Glucose, UA 07/15/2020 NEGATIVE  NEGATIVE mg/dL Final  . Hgb urine dipstick 07/15/2020 NEGATIVE  NEGATIVE Final  . Bilirubin Urine 07/15/2020 NEGATIVE  NEGATIVE Final  . Ketones, ur 07/15/2020 NEGATIVE  NEGATIVE mg/dL Final  . Protein,  ur 07/15/2020 NEGATIVE  NEGATIVE mg/dL Final  . Nitrite 07/15/2020 NEGATIVE  NEGATIVE Final  . Leukocytes,Ua 07/15/2020 NEGATIVE  NEGATIVE Final  . WBC, UA 07/15/2020 NONE SEEN  0 - 5 WBC/hpf Final  . Bacteria, UA 07/15/2020 NONE SEEN  NONE SEEN Final  . Squamous Epithelial / LPF 07/15/2020 0-5  0 - 5 Final   Performed at [facility], [address] [place], [place] [address]    Assessment:  Lynn Rosario is a 87 y.o. female with anemia of stage 3b chronic kidney disease and iron deficiency.  Diet appears good.  She has taken oral iron once daily for years but does not take it with vitamin C. She takes Synthroid for hypothyroidism.  CBC on 04/24/2020 revealed a hematocrit 27.0, hemoglobin 8.4, MCV 96.1, platelets 542,000, WBC 8,600.   Work-up on 05/13/2020 revealed a hematocrit of 27.7, hemoglobin 8.6, MCV 93.6, platelets 599,000, WBC 9,100. Ferritin was 21 with an iron saturation of 5% (low) and a TIBC of 319. Reticulocyte count was 2.6%.  Sed  rate was 73. Vitamin B12 was 4,578 and folate 70.7. TSH was 2.675. SPEP showed no M-spike. Kappa free light chains were 35.1, lambda free light chains 30.6, and ratio 1.15 (normal).  She received Epogen 40,000 units on 03/04/2015 and 03/10/2015.  Creatinine was 1.41 on 04/24/2020  She has iron deficiency anemia.  She received Feraheme on 03/05/2015 and 03/11/2015.  She has taken oral iron for years.  She has not had a colonoscopy in 15-20 years.    The patient does not plan to receive the COVID-19 vaccine. She had a bad reaction to the flu vaccine 10 years ago.  Symptomatically, she has been "ok".  Shortness of breath has improved. She is able to get around a bit better. She denies blood in the stool, black stools, and hematuria.  She does not eat a lot of iron rich foods. She eats a lot of cereal. She takes oral iron BID. Exam is stable.  Plan: 1.   Iron deficiency anemia  Hematocrit 27.7.  Hemoglobin 8.6.  MCV 93.6 on 05/13/2020.      Ferritin 21 with an iron saturation of 5% and a TIBC of 319.     Additional work-up was negative.   Hematocrit 24.4.  Hemoglobin 7.4.  MCV 86.5 on 07/17/2020.   Ferritin 25.  She has been on oral iron for years.  She denies any melena or hematochezia.  Last colonoscopy was 15-20 years ago.   She may have a GI source of bleeding.  Readdress consideration of Venofer.   Potential side effects reviewed.   Patient consented to treatment. 2.   Anemia of stage 3b chronic kidney disease   Creatinine was 1.27 on 07/15/2020.  Consider Retacrit if iron stores are normal and hemoglobin remains < 10. 3.   Cancel prior appts. 4.   RTC weekly x 2 for labs (CBC, hold tube) and Venofer. 5.   RTC in 4 weeks for MD assessment, labs (CBC with diff, ferritin, iron stores- day before) and +/- Venofer or Retacrit.  I discussed the assessment and treatment plan with the patient.  The patient was provided an opportunity to ask questions and all were answered.  The patient agreed with the plan and demonstrated an understanding of the instructions.  The patient was advised to call back if the symptoms worsen or if the condition fails to improve as anticipated.  I provided 20 minutes of face-to-face time during this this encounter and > 50% was spent counseling as documented under my assessment and plan.  An additional 7 minutes were spent reviewing her chart (Epic and Care Everywhere) including notes, labs, and imaging studies.    Melissa C. Mike Gip, MD, PhD    07/17/2020, 12:26 PM  I, De Burrs, am acting as scribe for [employer]. Mike Gip, MD, PhD.  I, Melissa C. Mike Gip, MD, have reviewed the above documentation for accuracy and completeness, and I agree with the above.

## 2020-07-21 ENCOUNTER — Other Ambulatory Visit: Payer: Self-pay | Admitting: *Deleted

## 2020-07-21 ENCOUNTER — Inpatient Hospital Stay: Payer: Medicare Other

## 2020-07-21 DIAGNOSIS — D509 Iron deficiency anemia, unspecified: Secondary | ICD-10-CM

## 2020-07-22 ENCOUNTER — Ambulatory Visit: Payer: Medicare Other

## 2020-07-22 ENCOUNTER — Ambulatory Visit: Payer: Medicare Other | Admitting: Hematology and Oncology

## 2020-07-23 ENCOUNTER — Inpatient Hospital Stay: Payer: Medicare Other

## 2020-07-23 ENCOUNTER — Other Ambulatory Visit: Payer: Self-pay

## 2020-07-23 VITALS — BP 132/63 | HR 66 | Temp 96.0°F | Resp 18

## 2020-07-23 DIAGNOSIS — N1832 Chronic kidney disease, stage 3b: Secondary | ICD-10-CM

## 2020-07-23 DIAGNOSIS — D509 Iron deficiency anemia, unspecified: Secondary | ICD-10-CM

## 2020-07-23 DIAGNOSIS — D631 Anemia in chronic kidney disease: Secondary | ICD-10-CM

## 2020-07-23 LAB — CBC WITH DIFFERENTIAL/PLATELET
Abs Immature Granulocytes: 0.08 10*3/uL — ABNORMAL HIGH (ref 0.00–0.07)
Basophils Absolute: 0.1 10*3/uL (ref 0.0–0.1)
Basophils Relative: 1 %
Eosinophils Absolute: 0.1 10*3/uL (ref 0.0–0.5)
Eosinophils Relative: 1 %
HCT: 27.8 % — ABNORMAL LOW (ref 36.0–46.0)
Hemoglobin: 8.2 g/dL — ABNORMAL LOW (ref 12.0–15.0)
Immature Granulocytes: 1 %
Lymphocytes Relative: 18 %
Lymphs Abs: 2.3 10*3/uL (ref 0.7–4.0)
MCH: 26.2 pg (ref 26.0–34.0)
MCHC: 29.5 g/dL — ABNORMAL LOW (ref 30.0–36.0)
MCV: 88.8 fL (ref 80.0–100.0)
Monocytes Absolute: 0.7 10*3/uL (ref 0.1–1.0)
Monocytes Relative: 5 %
Neutro Abs: 9.5 10*3/uL — ABNORMAL HIGH (ref 1.7–7.7)
Neutrophils Relative %: 74 %
Platelets: 533 10*3/uL — ABNORMAL HIGH (ref 150–400)
RBC: 3.13 MIL/uL — ABNORMAL LOW (ref 3.87–5.11)
RDW: 19.3 % — ABNORMAL HIGH (ref 11.5–15.5)
WBC: 12.7 10*3/uL — ABNORMAL HIGH (ref 4.0–10.5)
nRBC: 0 % (ref 0.0–0.2)

## 2020-07-23 LAB — SAMPLE TO BLOOD BANK

## 2020-07-23 MED ORDER — SODIUM CHLORIDE 0.9 % IV SOLN
Freq: Once | INTRAVENOUS | Status: AC
  Filled 2020-07-23: qty 250

## 2020-07-23 MED ORDER — SODIUM CHLORIDE 0.9 % IV SOLN: 200.0000 mg | Freq: Once | INTRAVENOUS | Status: DC

## 2020-07-23 MED ORDER — IRON SUCROSE 20 MG/ML IV SOLN
200.0000 mg | Freq: Once | INTRAVENOUS | Status: AC
  Administered 2020-07-23: 200 mg via INTRAVENOUS
  Filled 2020-07-23: qty 10

## 2020-07-28 ENCOUNTER — Inpatient Hospital Stay: Payer: Medicare Other

## 2020-07-29 ENCOUNTER — Ambulatory Visit: Payer: Medicare Other

## 2020-07-29 ENCOUNTER — Other Ambulatory Visit: Payer: Medicare Other

## 2020-07-29 ENCOUNTER — Ambulatory Visit: Payer: Medicare Other | Admitting: Hematology and Oncology

## 2020-07-30 ENCOUNTER — Inpatient Hospital Stay: Payer: Medicare Other

## 2020-08-07 ENCOUNTER — Inpatient Hospital Stay: Payer: Medicare Other

## 2020-08-08 ENCOUNTER — Other Ambulatory Visit: Payer: Self-pay

## 2020-08-08 DIAGNOSIS — D631 Anemia in chronic kidney disease: Secondary | ICD-10-CM

## 2020-08-10 ENCOUNTER — Other Ambulatory Visit: Payer: Self-pay

## 2020-08-10 ENCOUNTER — Inpatient Hospital Stay
Admission: EM | Admit: 2020-08-10 | Discharge: 2020-08-15 | DRG: 375 | Disposition: A | Discharge status: Hospice | Payer: Medicare Other | Attending: Internal Medicine | Admitting: Internal Medicine

## 2020-08-10 DIAGNOSIS — Z803 Family history of malignant neoplasm of breast: Secondary | ICD-10-CM | POA: Diagnosis not present

## 2020-08-10 DIAGNOSIS — Z66 Do not resuscitate: Secondary | ICD-10-CM | POA: Diagnosis present

## 2020-08-10 DIAGNOSIS — N189 Chronic kidney disease, unspecified: Secondary | ICD-10-CM | POA: Diagnosis present

## 2020-08-10 DIAGNOSIS — R911 Solitary pulmonary nodule: Secondary | ICD-10-CM | POA: Diagnosis present

## 2020-08-10 DIAGNOSIS — Z888 Allergy status to other drugs, medicaments and biological substances status: Secondary | ICD-10-CM | POA: Diagnosis not present

## 2020-08-10 DIAGNOSIS — I129 Hypertensive chronic kidney disease with stage 1 through stage 4 chronic kidney disease, or unspecified chronic kidney disease: Secondary | ICD-10-CM | POA: Diagnosis present

## 2020-08-10 DIAGNOSIS — N939 Abnormal uterine and vaginal bleeding, unspecified: Secondary | ICD-10-CM | POA: Diagnosis not present

## 2020-08-10 DIAGNOSIS — N179 Acute kidney failure, unspecified: Secondary | ICD-10-CM

## 2020-08-10 DIAGNOSIS — Z79891 Long term (current) use of opiate analgesic: Secondary | ICD-10-CM

## 2020-08-10 DIAGNOSIS — M48062 Spinal stenosis, lumbar region with neurogenic claudication: Secondary | ICD-10-CM | POA: Diagnosis present

## 2020-08-10 DIAGNOSIS — R54 Age-related physical debility: Secondary | ICD-10-CM | POA: Diagnosis present

## 2020-08-10 DIAGNOSIS — Z800 Family history of malignant neoplasm of digestive organs: Secondary | ICD-10-CM

## 2020-08-10 DIAGNOSIS — C20 Malignant neoplasm of rectum: Principal | ICD-10-CM | POA: Diagnosis present

## 2020-08-10 DIAGNOSIS — Z8249 Family history of ischemic heart disease and other diseases of the circulatory system: Secondary | ICD-10-CM | POA: Diagnosis not present

## 2020-08-10 DIAGNOSIS — G8929 Other chronic pain: Secondary | ICD-10-CM | POA: Diagnosis not present

## 2020-08-10 DIAGNOSIS — Z881 Allergy status to other antibiotic agents status: Secondary | ICD-10-CM | POA: Diagnosis not present

## 2020-08-10 DIAGNOSIS — Z96652 Presence of left artificial knee joint: Secondary | ICD-10-CM | POA: Diagnosis present

## 2020-08-10 DIAGNOSIS — Z20822 Contact with and (suspected) exposure to covid-19: Secondary | ICD-10-CM | POA: Diagnosis present

## 2020-08-10 DIAGNOSIS — F5104 Psychophysiologic insomnia: Secondary | ICD-10-CM | POA: Diagnosis present

## 2020-08-10 DIAGNOSIS — Z823 Family history of stroke: Secondary | ICD-10-CM

## 2020-08-10 DIAGNOSIS — N1832 Chronic kidney disease, stage 3b: Secondary | ICD-10-CM | POA: Diagnosis present

## 2020-08-10 DIAGNOSIS — D62 Acute posthemorrhagic anemia: Secondary | ICD-10-CM | POA: Diagnosis present

## 2020-08-10 DIAGNOSIS — Z515 Encounter for palliative care: Secondary | ICD-10-CM | POA: Diagnosis not present

## 2020-08-10 DIAGNOSIS — I10 Essential (primary) hypertension: Secondary | ICD-10-CM

## 2020-08-10 DIAGNOSIS — Z90710 Acquired absence of both cervix and uterus: Secondary | ICD-10-CM | POA: Diagnosis not present

## 2020-08-10 DIAGNOSIS — D649 Anemia, unspecified: Secondary | ICD-10-CM

## 2020-08-10 DIAGNOSIS — K449 Diaphragmatic hernia without obstruction or gangrene: Secondary | ICD-10-CM | POA: Diagnosis present

## 2020-08-10 DIAGNOSIS — C787 Secondary malignant neoplasm of liver and intrahepatic bile duct: Secondary | ICD-10-CM

## 2020-08-10 DIAGNOSIS — E039 Hypothyroidism, unspecified: Secondary | ICD-10-CM | POA: Diagnosis present

## 2020-08-10 DIAGNOSIS — K922 Gastrointestinal hemorrhage, unspecified: Secondary | ICD-10-CM | POA: Diagnosis present

## 2020-08-10 DIAGNOSIS — K6289 Other specified diseases of anus and rectum: Secondary | ICD-10-CM

## 2020-08-10 DIAGNOSIS — Z7982 Long term (current) use of aspirin: Secondary | ICD-10-CM

## 2020-08-10 DIAGNOSIS — Z9049 Acquired absence of other specified parts of digestive tract: Secondary | ICD-10-CM

## 2020-08-10 DIAGNOSIS — Z7189 Other specified counseling: Secondary | ICD-10-CM | POA: Diagnosis not present

## 2020-08-10 DIAGNOSIS — Z79890 Hormone replacement therapy (postmenopausal): Secondary | ICD-10-CM

## 2020-08-10 LAB — HEMOGLOBIN AND HEMATOCRIT, BLOOD
HCT: 26.3 % — ABNORMAL LOW (ref 36.0–46.0)
HCT: 25.9 % — ABNORMAL LOW (ref 36.0–46.0)
HCT: 26.2 % — ABNORMAL LOW (ref 36.0–46.0)
Hemoglobin: 8.5 g/dL — ABNORMAL LOW (ref 12.0–15.0)
Hemoglobin: 8.3 g/dL — ABNORMAL LOW (ref 12.0–15.0)
Hemoglobin: 8.4 g/dL — ABNORMAL LOW (ref 12.0–15.0)

## 2020-08-10 LAB — CBC WITH DIFFERENTIAL/PLATELET
Abs Immature Granulocytes: 0.11 10*3/uL — ABNORMAL HIGH (ref 0.00–0.07)
Basophils Absolute: 0.1 10*3/uL (ref 0.0–0.1)
Basophils Relative: 1 %
Eosinophils Absolute: 0.1 10*3/uL (ref 0.0–0.5)
Eosinophils Relative: 1 %
HCT: 22.7 % — ABNORMAL LOW (ref 36.0–46.0)
Hemoglobin: 6.9 g/dL — ABNORMAL LOW (ref 12.0–15.0)
Immature Granulocytes: 1 %
Lymphocytes Relative: 15 %
Lymphs Abs: 1.7 10*3/uL (ref 0.7–4.0)
MCH: 27.9 pg (ref 26.0–34.0)
MCHC: 30.4 g/dL (ref 30.0–36.0)
MCV: 91.9 fL (ref 80.0–100.0)
Monocytes Absolute: 0.7 10*3/uL (ref 0.1–1.0)
Monocytes Relative: 6 %
Neutro Abs: 8.9 10*3/uL — ABNORMAL HIGH (ref 1.7–7.7)
Neutrophils Relative %: 76 %
Platelets: 508 10*3/uL — ABNORMAL HIGH (ref 150–400)
RBC: 2.47 MIL/uL — ABNORMAL LOW (ref 3.87–5.11)
RDW: 19.9 % — ABNORMAL HIGH (ref 11.5–15.5)
WBC: 11.6 10*3/uL — ABNORMAL HIGH (ref 4.0–10.5)
nRBC: 0 % (ref 0.0–0.2)

## 2020-08-10 LAB — COMPREHENSIVE METABOLIC PANEL
ALT: 14 U/L (ref 0–44)
AST: 29 U/L (ref 15–41)
Albumin: 3.1 g/dL — ABNORMAL LOW (ref 3.5–5.0)
Alkaline Phosphatase: 101 U/L (ref 38–126)
Anion gap: 10 (ref 5–15)
BUN: 33 mg/dL — ABNORMAL HIGH (ref 8–23)
CO2: 24 mmol/L (ref 22–32)
Calcium: 7.7 mg/dL — ABNORMAL LOW (ref 8.9–10.3)
Chloride: 102 mmol/L (ref 98–111)
Creatinine, Ser: 1.67 mg/dL — ABNORMAL HIGH (ref 0.44–1.00)
GFR, Estimated: 29 mL/min — ABNORMAL LOW (ref >60)
Glucose, Bld: 111 mg/dL — ABNORMAL HIGH (ref 70–99)
Potassium: 4.6 mmol/L (ref 3.5–5.1)
Sodium: 136 mmol/L (ref 135–145)
Total Bilirubin: 0.4 mg/dL (ref 0.3–1.2)
Total Protein: 6.1 g/dL — ABNORMAL LOW (ref 6.5–8.1)

## 2020-08-10 LAB — PROTIME-INR
INR: 1.1 (ref 0.8–1.2)
Prothrombin Time: 13.4 seconds (ref 11.4–15.2)

## 2020-08-10 LAB — ABO/RH: ABO/RH(D): B POS

## 2020-08-10 LAB — RESP PANEL BY RT-PCR (FLU A&B, COVID) ARPGX2
Influenza A by PCR: NEGATIVE
Influenza B by PCR: NEGATIVE
SARS Coronavirus 2 by RT PCR: NEGATIVE

## 2020-08-10 LAB — PREPARE RBC (CROSSMATCH)

## 2020-08-10 MED ORDER — ACETAMINOPHEN 325 MG PO TABS
650.0000 mg | ORAL_TABLET | Freq: Every day | ORAL | Status: DC
  Administered 2020-08-13: 17:00:00 650 mg via ORAL
  Filled 2020-08-10 (×3): qty 2

## 2020-08-10 MED ORDER — SODIUM CHLORIDE 0.9 % IV SOLN: INTRAVENOUS | Status: DC

## 2020-08-10 MED ORDER — LEVOTHYROXINE SODIUM 50 MCG PO TABS
75.0000 ug | ORAL_TABLET | Freq: Every day | ORAL | Status: DC
  Administered 2020-08-13 – 2020-08-15 (×3): 75 ug via ORAL
  Filled 2020-08-10 (×3): qty 1

## 2020-08-10 MED ORDER — ONDANSETRON HCL 4 MG/2ML IJ SOLN
4.0000 mg | Freq: Four times a day (QID) | INTRAMUSCULAR | Status: DC | PRN
  Administered 2020-08-11: 4 mg via INTRAVENOUS
  Filled 2020-08-10: qty 2

## 2020-08-10 MED ORDER — ADULT MULTIVITAMIN W/MINERALS CH
1.0000 | ORAL_TABLET | Freq: Every day | ORAL | Status: DC
  Administered 2020-08-11 – 2020-08-15 (×4): 1 via ORAL
  Filled 2020-08-10 (×4): qty 1

## 2020-08-10 MED ORDER — SODIUM CHLORIDE 0.9 % IV SOLN: 10.0000 mL/h | Freq: Once | INTRAVENOUS | Status: DC

## 2020-08-10 MED ORDER — ALBUTEROL SULFATE (2.5 MG/3ML) 0.083% IN NEBU: 2.5000 mg | INHALATION_SOLUTION | RESPIRATORY_TRACT | Status: DC | PRN

## 2020-08-10 MED ORDER — ONDANSETRON HCL 4 MG PO TABS: 4.0000 mg | ORAL_TABLET | Freq: Four times a day (QID) | ORAL | Status: DC | PRN

## 2020-08-10 MED ORDER — PANTOPRAZOLE SODIUM 40 MG IV SOLR
40.0000 mg | Freq: Two times a day (BID) | INTRAVENOUS | Status: DC
  Administered 2020-08-10 – 2020-08-15 (×10): 40 mg via INTRAVENOUS
  Filled 2020-08-10 (×10): qty 40

## 2020-08-10 MED ORDER — SODIUM BICARBONATE 650 MG PO TABS
650.0000 mg | ORAL_TABLET | Freq: Two times a day (BID) | ORAL | Status: DC
  Administered 2020-08-11 – 2020-08-14 (×6): 650 mg via ORAL
  Filled 2020-08-10 (×9): qty 1

## 2020-08-10 MED ORDER — PANTOPRAZOLE SODIUM 40 MG IV SOLR
40.0000 mg | Freq: Once | INTRAVENOUS | Status: AC
  Administered 2020-08-10: 40 mg via INTRAVENOUS
  Filled 2020-08-10: qty 40

## 2020-08-10 MED ORDER — HYDROCODONE-ACETAMINOPHEN 5-325 MG PO TABS
1.0000 | ORAL_TABLET | Freq: Four times a day (QID) | ORAL | Status: DC | PRN
  Administered 2020-08-10 – 2020-08-15 (×11): 1 via ORAL
  Filled 2020-08-10 (×12): qty 1

## 2020-08-10 MED ORDER — ALPRAZOLAM 0.25 MG PO TABS
0.2500 mg | ORAL_TABLET | Freq: Two times a day (BID) | ORAL | Status: DC | PRN
  Administered 2020-08-10 – 2020-08-15 (×9): 0.25 mg via ORAL
  Filled 2020-08-10 (×10): qty 1

## 2020-08-10 NOTE — ED Triage Notes (Signed)
Pt arrives via EMS from daughters house after having some increasing weakness- pt's daughter had to help get her off the toilet- daughter noted 2 large blood clots in her depends- pt has had issues with her blood levels before and has a hx of blood transfusion- pt had a ow bp with [facility] who gave her fluids- pt not on any blood thinners

## 2020-08-10 NOTE — H&P (Signed)
History and Physical    [name] STM:[id number] DOB: 02/05/1929 DOA: 08/10/2020  PCP: Sherrin Daisy, MD  Patient coming from: home  I have personally briefly reviewed patient's old medical records in [place]  Chief Complaint: bloody stools  HPI: Lynn Rosario is a 87 y.o. female with medical history significant of  chronic kidney disease stage III, hypertension, hypothyrodisim, stenosis with neurogenic claudication,lower extremity edema, malnutrition and anemia of chronic kidney disease and iron deficiency treated with venfor and recrit who presents to ed BIBEMS for weakness increased from baseline and blood clot noted with bowel movement. Of note per daughter patient has has complaint of BRBPR x 1 week with hx of ongoing diarrhea over the last 2 weeks. Per daughter she became concerned when she noted blood clots in patients diaper and noted that patient was severely weak. Patient noted no associated n/v/ abdominal pain, f/c/chest pain or short of breath but notes presyncope and weakness. In ed on evaluation patient found to have hgb of 6.9 down from8.2, 2 weeks prior.  At that time gi was consulted.  Patient of note initial was note interested in further evaluation but notes currently that she is willing to have C-scope if necessary. Daughter at bedside is agreeable to further evaluation as well.    ED Course:  Afeb, bp 146/92-110/49, hr 82, rr 16, sat 98% Labs: wbc 11/6, hgb 6.9 down from 8.2 07/23/20 plt508, inr 1.1 Na:136, K4.6, cr 1.67 prior 1.27 gfr29 EKG nsr Review of Systems: As per HPI otherwise 10 point review of systems negative.   Past Medical History:  Diagnosis Date  . Anemia   . Hypertension   . Hypothyroidism   . Insomnia   . Lumbar radiculitis   . Lumbar spondylosis   . Lumbar stenosis with neurogenic claudication   . Stage III chronic kidney disease [facility])     Past Surgical History:  Procedure Laterality Date  . ABDOMINAL HYSTERECTOMY    .  APPENDECTOMY    . TOTAL KNEE ARTHROPLASTY Left 2002     reports that she has never smoked. She has never used smokeless tobacco. She reports that she does not drink alcohol and does not use drugs.  Allergies  Allergen Reactions  . Celecoxib Other (See Comments)    Decreased kidney function   . Clarithromycin Other (See Comments)    Loss of taste     Family History  Problem Relation Age of Onset  . Stroke Mother   . CAD Father   . Stomach cancer Sister   . Breast cancer Sister   . Breast cancer Sister   . Breast cancer Daughter     Prior to Admission medications   Medication Sig Start Date End Date Taking? Authorizing Provider  acetaminophen (TYLENOL) 325 MG tablet Take 650 mg by mouth daily after lunch.    [provider]  ALPRAZolam (XANAX) 0.25 MG tablet TAKE (1) TABLET BY MOUTH TWICE DAILY AS NEEDED FOR SLEEP/ANXIETY Patient not taking: Reported on 07/17/2020 11/15/14   [provider]  amLODipine (NORVASC) 2.5 MG tablet  07/31/19 07/30/20  [provider]  aspirin EC 81 MG tablet Take 81 mg by mouth daily.     [provider]  Cetirizine HCl 10 MG CAPS Take 10 mg by mouth as needed.    [provider]  Cyanocobalamin 2500 MCG SUBL Place 1 tablet under the tongue daily.  Patient not taking: No sig reported    [provider]  ferrous sulfate 325 (  65 FE) MG EC tablet Take by mouth daily.    [provider]  furosemide (LASIX) 20 MG tablet Take 20 mg by mouth. Every other day 04/24/20 07/15/20  [provider]  HYDROcodone-acetaminophen (NORCO/VICODIN) 5-325 MG per tablet 1 tablet 3 (three) times daily. 11/07/14   [provider]  levothyroxine (SYNTHROID) 75 MCG tablet TAKE 1 TABLET ON AN EMPTY STOMACH WITH A GLASS OF WATER AT LEAST 30 TO 27 MINUTES BEFORE BREAKFAST. 04/23/11   [provider]  Multiple Vitamin (MULTI-VITAMINS) TABS Take 1 tablet by mouth daily.     [provider]   sodium bicarbonate 650 MG tablet Take 650 mg by mouth 2 (two) times daily.    [provider]    Physical Exam: Vitals:   08/10/20 1130 08/10/20 1200 08/10/20 1242 08/10/20 1303  BP: (!) 122/48 (!) 124/51 (!) 114/48 (!) 114/55  Pulse: 73 71 75 75  Resp: 18 17 20 17   Temp:   98.9 F (37.2 C) 99 F (37.2 C)  TempSrc:   Oral Oral  SpO2: 100% 100% 100% 100%  Weight:      Height:         Vitals:   08/10/20 1130 08/10/20 1200 08/10/20 1242 08/10/20 1303  BP: (!) 122/48 (!) 124/51 (!) 114/48 (!) 114/55  Pulse: 73 71 75 75  Resp: 18 17 20 17   Temp:   98.9 F (37.2 C) 99 F (37.2 C)  TempSrc:   Oral Oral  SpO2: 100% 100% 100% 100%  Weight:      Height:      Constitutional: NAD, calm, comfortable Eyes: PERRL, lids and conjunctivae normal ENMT: Mucous membranes are dry. Posterior pharynx clear of any exudate or lesions.Normal dentition.  Neck: normal, supple, no masses, no thyromegaly Respiratory: clear to auscultation bilaterally, no wheezing, no crackles. Normal respiratory effort. No accessory muscle use.  Cardiovascular: Regular rate and rhythm, no murmurs / rubs / gallops. No extremity edema. 2+ pedal pulses. Abdomen: no tenderness, no masses palpated. No hepatosplenomegaly. Bowel sounds positive.  Musculoskeletal: no clubbing / cyanosis. No joint deformity upper and lower extremities. Good ROM, no contractures. Normal muscle tone.  Skin: no rashes, lesions, ulcers. No induration Neurologic: CN 2-12 grossly intact. Sensation intact,Strength 5/5 in all 4.  Psychiatric: Normal judgment and insight. Alert and oriented x 3. Normal mood.    Labs on Admission: I have personally reviewed following labs and imaging studies  CBC: Recent Labs  Lab 08/10/20 1019  WBC 11.6*  NEUTROABS 8.9*  HGB 6.9*  HCT 22.7*  MCV 91.9  PLT 767*   Basic Metabolic Panel: Recent Labs  Lab 08/10/20 1019  NA 136  K 4.6  CL 102  CO2 24  GLUCOSE 111*  BUN 33*  CREATININE 1.67*   CALCIUM 7.7*   GFR: Estimated Creatinine Clearance: 16.4 mL/min (A) (by C-G formula based on SCr of 1.67 mg/dL (H)). Liver Function Tests: Recent Labs  Lab 08/10/20 1019  AST 29  ALT 14  ALKPHOS 101  BILITOT 0.4  PROT 6.1*  ALBUMIN 3.1*   No results for input(s): LIPASE, AMYLASE in the last 168 hours. No results for input(s): AMMONIA in the last 168 hours. Coagulation Profile: Recent Labs  Lab 08/10/20 1019  INR 1.1   Cardiac Enzymes: No results for input(s): CKTOTAL, CKMB, CKMBINDEX, TROPONINI in the last 168 hours. BNP (last 3 results) No results for input(s): PROBNP in the last 8760 hours. HbA1C: No results for input(s): HGBA1C in the last  72 hours. CBG: No results for input(s): GLUCAP in the last 168 hours. Lipid Profile: No results for input(s): CHOL, HDL, LDLCALC, TRIG, CHOLHDL, LDLDIRECT in the last 72 hours. Thyroid Function Tests: No results for input(s): TSH, T4TOTAL, FREET4, T3FREE, THYROIDAB in the last 72 hours. Anemia Panel: No results for input(s): VITAMINB12, FOLATE, FERRITIN, TIBC, IRON, RETICCTPCT in the last 72 hours. Urine analysis:    Component Value Date/Time   COLORURINE STRAW (A) 07/15/2020 2351   APPEARANCEUR CLEAR (A) 07/15/2020 2351   LABSPEC 1.006 07/15/2020 2351   PHURINE 9.0 (H) 07/15/2020 2351   GLUCOSEU NEGATIVE 07/15/2020 2351   HGBUR NEGATIVE 07/15/2020 2351   BILIRUBINUR NEGATIVE 07/15/2020 2351   KETONESUR NEGATIVE 07/15/2020 2351   PROTEINUR NEGATIVE 07/15/2020 2351   NITRITE NEGATIVE 07/15/2020 2351   LEUKOCYTESUR NEGATIVE 07/15/2020 2351    Radiological Exams on Admission: No results found.  EKG: Independently reviewed. See above    Assessment/Plan  Acute Lower GI Bleed with symptomatic anemia  -admit to tele  -no further bleeding noted in ed  -no associated abdominal pain -s/p 2 units prbc in ed  -stable vitas -monitor h/h, transfuse if <7 -ppi bid iv  -rbc scan ordered -gi recs noted    Acute on  Chronic Anemia  -ACD and IDA -monitor h/h  -check iron panel  -on infusions with venfor /recrit as out patient  AKI On CKDIII -repeat labs s/p transfusion  -hold nephrotoxic medications  -patient on lasix due to chronic lower extremity edema  -gentle ivfs while npo   HTN -stable bp  -resume home regimen in am as bp tolerates   Hypothyroidism -resume synthroid   lumbar stenosis with neurogenic claudication -no acute issues  -place on fall precautions    DVT prophylaxis: scd Code Status: FULL Family Communication:  N/a Disposition Plan: patient  expected to be admitted greater than 2 midnights Consults called: GI [place]  Admission status:inpatient   Clance Boll MD [facility]  If 7PM-7AM, please contact night-coverage [email or web address] Password [facility]  08/10/2020, 2:03 PM

## 2020-08-10 NOTE — ED Provider Notes (Signed)
[facility] Emergency Department Provider Note  ____________________________________________   Event Date/Time   First MD Initiated Contact with Patient 08/10/20 0957     (approximate)  I have reviewed the triage vital signs and the nursing notes.   HISTORY  Chief Complaint GI Bleeding   HPI Lynn Rosario is a 87 y.o. female with a past medical history of hypothyroidism, HTN, anemia, chronic low back pain and CKD 3 who presents via EMS from home for assessment of some weakness she has been experiencing on and off for the last couple days it seemed worse today and some blood clots noted in her depends by her daughter who she is living with.  Patient denies any prior similar episodes.  She denies any other recent bleeding or injuries or falls.  Denies any headache, earache, sore throat, chest pain, cough, shortness of breath, abdominal pain, back pain, vaginal bleeding or discharge, burning with urination, rash or extremity pain weakness numbness or tingling.  States he is on daily ASA but no other blood thinners.         Past Medical History:  Diagnosis Date  . Anemia   . Hypertension   . Hypothyroidism   . Insomnia   . Lumbar radiculitis   . Lumbar spondylosis   . Lumbar stenosis with neurogenic claudication   . Stage III chronic kidney disease [facility])     Patient Active Problem List   Diagnosis Date Noted  . Iron deficiency anemia 05/12/2020  . Anemia due to stage 3 chronic kidney disease ([place]) 08/24/2016  . Anemia in chronic kidney disease 02/24/2015    Past Surgical History:  Procedure Laterality Date  . ABDOMINAL HYSTERECTOMY    . APPENDECTOMY    . TOTAL KNEE ARTHROPLASTY Left 2002    Prior to Admission medications   Medication Sig Start Date End Date Taking? Authorizing Provider  acetaminophen (TYLENOL) 325 MG tablet Take 650 mg by mouth daily after lunch.    [provider]  ALPRAZolam (XANAX) 0.25 MG tablet TAKE (1)  TABLET BY MOUTH TWICE DAILY AS NEEDED FOR SLEEP/ANXIETY Patient not taking: Reported on 07/17/2020 11/15/14   [provider]  amLODipine (NORVASC) 2.5 MG tablet  07/31/19 07/30/20  [provider]  aspirin EC 81 MG tablet Take 81 mg by mouth daily.     [provider]  Cetirizine HCl 10 MG CAPS Take 10 mg by mouth as needed.    [provider]  Cyanocobalamin 2500 MCG SUBL Place 1 tablet under the tongue daily.  Patient not taking: No sig reported    [provider]  ferrous sulfate 325 (65 FE) MG EC tablet Take by mouth daily.    [provider]  furosemide (LASIX) 20 MG tablet Take 20 mg by mouth. Every other day 04/24/20 07/15/20  [provider]  HYDROcodone-acetaminophen (NORCO/VICODIN) 5-325 MG per tablet 1 tablet 3 (three) times daily. 11/07/14   [provider]  levothyroxine (SYNTHROID) 75 MCG tablet TAKE 1 TABLET ON AN EMPTY STOMACH WITH A GLASS OF WATER AT LEAST 30 TO 12 MINUTES BEFORE BREAKFAST. 04/23/11   [provider]  Multiple Vitamin (MULTI-VITAMINS) TABS Take 1 tablet by mouth daily.     [provider]  sodium bicarbonate 650 MG tablet Take 650 mg by mouth 2 (two) times daily.    [provider]    Allergies Celecoxib and Clarithromycin  Family History  Problem Relation Age of Onset  . Stroke Mother   .  CAD Father   . Stomach cancer Sister   . Breast cancer Sister   . Breast cancer Sister   . Breast cancer Daughter     Social History Social History   Tobacco Use  . Smoking status: Never Smoker  . Smokeless tobacco: Never Used  Vaping Use  . Vaping Use: Never used  Substance Use Topics  . Alcohol use: No    Alcohol/week: 0.0 standard drinks  . Drug use: No    Review of Systems  Review of Systems  Constitutional: Negative for chills and fever.  HENT: Negative for sore throat.   Eyes: Negative for pain.  Respiratory: Negative for cough and stridor.    Cardiovascular: Negative for chest pain.  Gastrointestinal: Positive for blood in stool. Negative for vomiting.  Genitourinary: Negative for dysuria.  Musculoskeletal: Negative for myalgias.  Skin: Negative for rash.  Neurological: Negative for seizures, loss of consciousness and headaches.  Psychiatric/Behavioral: Negative for suicidal ideas.  All other systems reviewed and are negative.     ____________________________________________   PHYSICAL EXAM:  VITAL SIGNS: ED Triage Vitals  Enc Vitals Group     BP      Pulse      Resp      Temp      Temp src      SpO2      Weight      Height      Head Circumference      Peak Flow      Pain Score      Pain Loc      Pain Edu?      Excl. in [place]?    Vitals:   08/10/20 0957 08/10/20 1000  BP: (!) 146/92 (!) 110/49  Pulse: 82 76  Resp: 16 11  Temp: 97.7 F (36.5 C)   SpO2: 98% 98%   Physical Exam Vitals and nursing note reviewed.  Constitutional:      General: She is not in acute distress.    Appearance: She is well-developed.  HENT:     Head: Normocephalic and atraumatic.     Right Ear: External ear normal.     Left Ear: External ear normal.     Nose: Nose normal.  Eyes:     Conjunctiva/sclera: Conjunctivae normal.  Cardiovascular:     Rate and Rhythm: Normal rate and regular rhythm.     Heart sounds: No murmur heard.   Pulmonary:     Effort: Pulmonary effort is normal. No respiratory distress.     Breath sounds: Normal breath sounds.  Abdominal:     Palpations: Abdomen is soft.     Tenderness: There is no abdominal tenderness.  Musculoskeletal:     Cervical back: Neck supple.  Skin:    General: Skin is warm and dry.  Neurological:     Mental Status: She is alert and oriented to person, place, and time.  Psychiatric:        Mood and Affect: Mood normal.     External hemorrhoid visualized on rectal exam without active bleeding.  There is some bright red blood at the rectum and in the  depends. ____________________________________________   LABS (all labs ordered are listed, but only abnormal results are displayed)  Labs Reviewed  CBC WITH DIFFERENTIAL/PLATELET - Abnormal; Notable for the following components:      Result Value   WBC 11.6 (*)    RBC 2.47 (*)    Hemoglobin 6.9 (*)    HCT 22.7 (*)  RDW 19.9 (*)    Platelets 508 (*)    Neutro Abs 8.9 (*)    Abs Immature Granulocytes 0.11 (*)    All other components within normal limits  COMPREHENSIVE METABOLIC PANEL - Abnormal; Notable for the following components:   Glucose, Bld 111 (*)    BUN 33 (*)    Creatinine, Ser 1.67 (*)    Calcium 7.7 (*)    Total Protein 6.1 (*)    Albumin 3.1 (*)    GFR, Estimated 29 (*)    All other components within normal limits  RESP PANEL BY RT-PCR (FLU A&B, COVID) ARPGX2  PROTIME-INR  TYPE AND SCREEN  PREPARE RBC (CROSSMATCH)  ABO/RH   ____________________________________________  EKG  ____________________________________________  RADIOLOGY  ED MD interpretation:    Official radiology report(s): No results found.  ____________________________________________   PROCEDURES  Procedure(s) performed (including Critical Care):  .Critical Care Performed by: Lucrezia Starch, MD Authorized by: Lucrezia Starch, MD   Critical care provider statement:    Critical care time (minutes):  45   Critical care time was exclusive of:  Separately billable procedures and treating other patients   Critical care was necessary to treat or prevent imminent or life-threatening deterioration of the following conditions:  Circulatory failure   Critical care was time spent personally by me on the following activities:  Discussions with consultants, evaluation of patient's response to treatment, examination of patient, ordering and performing treatments and interventions, ordering and review of laboratory studies, ordering and review of radiographic studies, pulse oximetry,  re-evaluation of patient's condition, obtaining history from patient or surrogate and review of old charts     ____________________________________________   INITIAL IMPRESSION / [place] / ED COURSE      Presents with above stage III exam for assessment of some weakness worsening last couple days with some blood clots noted in her depends earlier today.  On arrival she is afebrile and hemodynamically stable.  She does have some bright red blood in her depends and emergency room exam without active bleeding.  Patient denies any other symptoms including abdominal pain back pain GI symptoms vomiting or recent injuries.  She is on daily ASA but no anticoagulation.  SPECT likely etiology of patient's generalized weakness is secondary to some acute on chronic anemia likely from lower GI bleed.  There is no pain vomiting diarrhea to suggest diverticulitis, appendicitis, cholecystitis, SBO she denies any urinary symptoms to suggest acute urinary tract infection.  CMP remarkable for evidence of AKI with a creatinine of 1.674 to 1.273 weeks ago.  No other significant electrolyte or metabolic derangements.  INR is unremarkable.  CBC remarkable for WBC count of 11.6 with a hemoglobin of 6.9 and unremarkable platelets.  Discussed concerns for lower GI bleed with on-call gastroenterologist Dr. Alice Reichert said he would come see the patient soon as he was able to.  When he PRBCs ordered.  I will plan to admit to medicine service for further evaluation and management.   ____________________________________________   FINAL CLINICAL IMPRESSION(S) / ED DIAGNOSES  Final diagnoses:  Gastrointestinal hemorrhage, unspecified gastrointestinal hemorrhage type  Low hemoglobin    Medications  0.9 %  sodium chloride infusion (has no administration in time range)  pantoprazole (PROTONIX) injection 40 mg (has no administration in time range)     ED Discharge Orders    None       Note:  This  document was prepared using Dragon voice recognition software and may include unintentional  dictation errors.   Lucrezia Starch, MD 08/10/20 1110

## 2020-08-10 NOTE — Consult Note (Signed)
GI Inpatient Consult Note  Reason for Consult: Lower GI Bleeding    Attending Requesting Consult: Dr. Hulan Saas, MD  History of Present Illness: Lynn Rosario is a 87 y.o. female seen for evaluation of lower GI bleeding at the request of Dr. Hulan Saas, MD. Pt has a PMH of HTN, hypothyroidism, chronic anemia, chronic insomnia, lumbar stenosis with neurogenic claudication, and CKD Stage III. Pt presented to the [facility] ED this morning via EMS from home for chief complaint of lower GI bleeding. Daughter present in room and helps to answer questions. She reports that on Tuesday of this week patient was complaining of some rectal bleeding which was bright red to dark red in her depends. Patient has been complaining of on and off diarrhea for past several weeks and has been using Imodium to control it. This morning with a BM patient was so weak she could not get off the toilet. Daughter noticed two large blood clots in her depends and some dark red blood down her legs so she called EMS. Upon presentation to the ED, patient was found to have acute blood loss anemia with hemoglobin 6.9 decreased from 8.2 two weeks ago and 7.4 three weeks ago. She follows as an outpatient with Dr. Mike Gip for iron-deficiency anemia and anemia of chronic disease. She reports she missed her last infusions scheduled for three weeks ago because patient was too weak to make it to the appointment. Patient denies any history of GI bleeding. She denies any fevers, chills, nausea, vomiting, abdominal pain, constipation, or rectal pain. She does not use frequent NSAIDs. She denies any UGI complaints like indigestion, dysphagia, odynophagia, early satiety, or epigastric abd pain. She denies vaginal bleeding. She does not remember when her last colonoscopy was but thinks it was at least >15 years ago. She is weak and tired, but otherwise no acute complaints.   Past Medical History:  Past Medical History:  Diagnosis Date  . Anemia    . Hypertension   . Hypothyroidism   . Insomnia   . Lumbar radiculitis   . Lumbar spondylosis   . Lumbar stenosis with neurogenic claudication   . Stage III chronic kidney disease ([place])     Problem List: Patient Active Problem List   Diagnosis Date Noted  . Iron deficiency anemia 05/12/2020  . Anemia due to stage 3 chronic kidney disease ([place]) 08/24/2016  . Anemia in chronic kidney disease 02/24/2015    Past Surgical History: Past Surgical History:  Procedure Laterality Date  . ABDOMINAL HYSTERECTOMY    . APPENDECTOMY    . TOTAL KNEE ARTHROPLASTY Left 2002    Allergies: Allergies  Allergen Reactions  . Celecoxib Other (See Comments)    Decreased kidney function   . Clarithromycin Other (See Comments)    Loss of taste     Home Medications: (Not in a hospital admission)  Home medication reconciliation was completed with the patient.   Scheduled Inpatient Medications:   . pantoprazole (PROTONIX) IV  40 mg Intravenous Once    Continuous Inpatient Infusions:   . sodium chloride      PRN Inpatient Medications:    Family History: family history includes Breast cancer in her daughter, sister, and sister; CAD in her father; Stomach cancer in her sister; Stroke in her mother.  The patient's family history is negative for inflammatory bowel disorders, GI malignancy, or solid organ transplantation.  Social History:   reports that she has never smoked. She has never used smokeless tobacco. She reports that  she does not drink alcohol and does not use drugs. The patient denies ETOH, tobacco, or drug use.   Review of Systems: Constitutional: Weight is stable.  Eyes: No changes in vision. ENT: No oral lesions, sore throat.  GI: see HPI.  Heme/Lymph: No easy bruising.  CV: No chest pain.  GU: No hematuria.  Integumentary: No rashes.  Neuro: No headaches.  Psych: No depression/anxiety.  Endocrine: No heat/cold intolerance.  Allergic/Immunologic: No urticaria.  Resp:  No cough, SOB.  Musculoskeletal: No joint swelling.    Physical Examination: BP (!) 110/49   Pulse 76   Temp 97.7 F (36.5 C) (Oral)   Resp 11   Ht 5' (1.524 m)   Wt 47.2 kg   SpO2 98%   BMI 20.31 kg/m   Frail, elderly-appearing female in ED stretcher. Answers all questions with help of daughter. Appears tired.  Gen: NAD, alert and oriented x 4 HEENT: PEERLA, EOMI, Neck: supple, no JVD or thyromegaly Chest: CTA bilaterally, no wheezes, crackles, or other adventitious sounds CV: RRR, no m/g/c/r Abd: soft, NT, ND, +BS in all four quadrants; no HSM, guarding, ridigity, or rebound tenderness Ext: no edema, well perfused with 2+ pulses, Skin: no rash or lesions noted Lymph: no LAD  Data: Lab Results  Component Value Date   WBC 11.6 (H) 08/10/2020   HGB 6.9 (L) 08/10/2020   HCT 22.7 (L) 08/10/2020   MCV 91.9 08/10/2020   PLT 508 (H) 08/10/2020   Recent Labs  Lab 08/10/20 1019  HGB 6.9*   Lab Results  Component Value Date   NA 136 08/10/2020   K 4.6 08/10/2020   CL 102 08/10/2020   CO2 24 08/10/2020   BUN 33 (H) 08/10/2020   CREATININE 1.67 (H) 08/10/2020   Lab Results  Component Value Date   ALT 14 08/10/2020   AST 29 08/10/2020   ALKPHOS 101 08/10/2020   BILITOT 0.4 08/10/2020   Recent Labs  Lab 08/10/20 1019  INR 1.1   Assessment/Plan:  87 y/o Caucasian female with a PMH of HTN, hypothyroidism, chronic anemia, chronic insomnia, lumbar stenosis with neurogenic claudication, and CKD Stage III presented to the [facility] ED this morning for hematochezia  1. Hematochezia 2. Acute blood loss/Symptomatic anemia 3. Iron-deficiency anemia/AOCD  -Clinical presentation and labs consistent with a lower GI bleed. -Differential includes diverticular bleed, stercoral changes, AVMs, anal outlet etiology from internal hemorrhoids, ischemic colitis, infectious colitis, IBD, malignancy, etc  Recommendations:  1. Agree with transfusion of 2 units pRBCs 2. Continue to  monitor H&H closely and transfuse for Hgb <7.0 3. Protonix IV for gastric protection 4. No evidence of active GI bleeding. If active bleeding occurs, advise tagged RBC scan and consult vascular surgery if positive 5. Repeat serial abdominal examinations 6. Consider colonoscopy when clinical feasible 7. Following   Thank you for the consult. Please call with questions or concerns.  Reeves Forth [facility] [phone] (Cell)

## 2020-08-11 ENCOUNTER — Inpatient Hospital Stay: Payer: Medicare Other

## 2020-08-11 DIAGNOSIS — G8929 Other chronic pain: Secondary | ICD-10-CM

## 2020-08-11 DIAGNOSIS — N189 Chronic kidney disease, unspecified: Secondary | ICD-10-CM

## 2020-08-11 DIAGNOSIS — E039 Hypothyroidism, unspecified: Secondary | ICD-10-CM

## 2020-08-11 DIAGNOSIS — N939 Abnormal uterine and vaginal bleeding, unspecified: Secondary | ICD-10-CM

## 2020-08-11 DIAGNOSIS — N179 Acute kidney failure, unspecified: Secondary | ICD-10-CM

## 2020-08-11 DIAGNOSIS — I10 Essential (primary) hypertension: Secondary | ICD-10-CM

## 2020-08-11 DIAGNOSIS — D62 Acute posthemorrhagic anemia: Secondary | ICD-10-CM

## 2020-08-11 LAB — COMPREHENSIVE METABOLIC PANEL
ALT: 13 U/L (ref 0–44)
AST: 26 U/L (ref 15–41)
Albumin: 2.6 g/dL — ABNORMAL LOW (ref 3.5–5.0)
Alkaline Phosphatase: 84 U/L (ref 38–126)
Anion gap: 6 (ref 5–15)
BUN: 29 mg/dL — ABNORMAL HIGH (ref 8–23)
CO2: 25 mmol/L (ref 22–32)
Calcium: 7.3 mg/dL — ABNORMAL LOW (ref 8.9–10.3)
Chloride: 104 mmol/L (ref 98–111)
Creatinine, Ser: 1.4 mg/dL — ABNORMAL HIGH (ref 0.44–1.00)
GFR, Estimated: 36 mL/min — ABNORMAL LOW (ref >60)
Glucose, Bld: 81 mg/dL (ref 70–99)
Potassium: 5 mmol/L (ref 3.5–5.1)
Sodium: 135 mmol/L (ref 135–145)
Total Bilirubin: 0.9 mg/dL (ref 0.3–1.2)
Total Protein: 5.2 g/dL — ABNORMAL LOW (ref 6.5–8.1)

## 2020-08-11 LAB — CBC
HCT: 25.9 % — ABNORMAL LOW (ref 36.0–46.0)
Hemoglobin: 8.3 g/dL — ABNORMAL LOW (ref 12.0–15.0)
MCH: 28.6 pg (ref 26.0–34.0)
MCHC: 32 g/dL (ref 30.0–36.0)
MCV: 89.3 fL (ref 80.0–100.0)
Platelets: 418 10*3/uL — ABNORMAL HIGH (ref 150–400)
RBC: 2.9 MIL/uL — ABNORMAL LOW (ref 3.87–5.11)
RDW: 18.6 % — ABNORMAL HIGH (ref 11.5–15.5)
WBC: 8.3 10*3/uL (ref 4.0–10.5)
nRBC: 0 % (ref 0.0–0.2)

## 2020-08-11 LAB — HEMOGLOBIN: Hemoglobin: 8.4 g/dL — ABNORMAL LOW (ref 12.0–15.0)

## 2020-08-11 MED ORDER — IOHEXOL 9 MG/ML PO SOLN
1000.0000 mL | Freq: Once | ORAL | Status: AC | PRN
  Administered 2020-08-11: 18:00:00 1000 mL via ORAL

## 2020-08-11 MED ORDER — ALUM & MAG HYDROXIDE-SIMETH 200-200-20 MG/5ML PO SUSP: 30.0000 mL | Freq: Four times a day (QID) | ORAL | Status: DC | PRN

## 2020-08-11 MED ORDER — PEG 3350-KCL-NA BICARB-NACL 420 G PO SOLR
4000.0000 mL | Freq: Once | ORAL | Status: AC
  Administered 2020-08-11: 4000 mL via ORAL
  Filled 2020-08-11: qty 4000

## 2020-08-11 MED ORDER — BISACODYL 5 MG PO TBEC
10.0000 mg | DELAYED_RELEASE_TABLET | Freq: Once | ORAL | Status: AC
  Administered 2020-08-11: 18:00:00 10 mg via ORAL
  Filled 2020-08-11: qty 2

## 2020-08-11 MED ORDER — SODIUM CHLORIDE 0.9 % IV SOLN
400.0000 mg | Freq: Once | INTRAVENOUS | Status: AC
  Administered 2020-08-11: 400 mg via INTRAVENOUS
  Filled 2020-08-11: qty 20

## 2020-08-11 MED ORDER — PEG 3350-KCL-NA BICARB-NACL 420 G PO SOLR
4000.0000 mL | Freq: Once | ORAL | Status: DC
  Filled 2020-08-11: qty 4000

## 2020-08-11 MED ORDER — LEVOTHYROXINE SODIUM 50 MCG PO TABS
75.0000 ug | ORAL_TABLET | Freq: Once | ORAL | Status: AC
  Administered 2020-08-11: 75 ug via ORAL
  Filled 2020-08-11: qty 1

## 2020-08-11 NOTE — Consult Note (Signed)
Reason for Consult:Vaginal bleeding Referring Physician: Dr. Mary Sella Rybolt is an 87 y.o. female.  HPI: She was admitted for bright red bleeding per the rectum and ongoing diarrhea.  She reports that she has a longstanding history of hemorrhoids and assumed that the bleeding was related to hemorrhoids. She reports the bleeding has been coming and going and she has mostly used preparation H to address the concern.   She reports she had a hysterectomy many years ago because of issues related to heavy uterine bleeding. She denies a history of abnormal pap smears. She denies her hysterectomy was related to cervical cancer or endometrial cancer/ hyperplasia.  She  Is uncertain if she retained her ovaries when this procedure was performed.  Her hysterectomy was performed through a vertical midline incision.   She reports she had two vaginal deliveries of female infants.    Past Medical History:  Diagnosis Date  . Anemia   . Hypertension   . Hypothyroidism   . Insomnia   . Lumbar radiculitis   . Lumbar spondylosis   . Lumbar stenosis with neurogenic claudication   . Stage III chronic kidney disease [facility])     Past Surgical History:  Procedure Laterality Date  . ABDOMINAL HYSTERECTOMY    . APPENDECTOMY    . TOTAL KNEE ARTHROPLASTY Left 2002    Family History  Problem Relation Age of Onset  . Stroke Mother   . CAD Father   . Stomach cancer Sister   . Breast cancer Sister   . Breast cancer Sister   . Breast cancer Daughter     Social History:  reports that she has never smoked. She has never used smokeless tobacco. She reports that she does not drink alcohol and does not use drugs.  Allergies:  Allergies  Allergen Reactions  . Celecoxib Other (See Comments)    Decreased kidney function   . Clarithromycin Other (See Comments)    Loss of taste     Medications: I have reviewed the patient's current medications.  Results for orders placed or performed during the  hospital encounter of 08/10/20 (from the past 48 hour(s))  CBC with Differential     Status: Abnormal   Collection Time: 08/10/20 10:19 AM  Result Value Ref Range   WBC 11.6 (H) 4.0 - 10.5 K/uL   RBC 2.47 (L) 3.87 - 5.11 MIL/uL   Hemoglobin 6.9 (L) 12.0 - 15.0 g/dL   HCT 22.7 (L) 36.0 - 46.0 %   MCV 91.9 80.0 - 100.0 fL   MCH 27.9 26.0 - 34.0 pg   MCHC 30.4 30.0 - 36.0 g/dL   RDW 19.9 (H) 11.5 - 15.5 %   Platelets 508 (H) 150 - 400 K/uL   nRBC 0.0 0.0 - 0.2 %   Neutrophils Relative % 76 %   Neutro Abs 8.9 (H) 1.7 - 7.7 K/uL   Lymphocytes Relative 15 %   Lymphs Abs 1.7 0.7 - 4.0 K/uL   Monocytes Relative 6 %   Monocytes Absolute 0.7 0.1 - 1.0 K/uL   Eosinophils Relative 1 %   Eosinophils Absolute 0.1 0.0 - 0.5 K/uL   Basophils Relative 1 %   Basophils Absolute 0.1 0.0 - 0.1 K/uL   Immature Granulocytes 1 %   Abs Immature Granulocytes 0.11 (H) 0.00 - 0.07 K/uL    Comment: Performed at [facility], [address], [place], [place] [address]  Comprehensive metabolic panel     Status: Abnormal   Collection Time: 08/10/20  10:19 AM  Result Value Ref Range   Sodium 136 135 - 145 mmol/L   Potassium 4.6 3.5 - 5.1 mmol/L   Chloride 102 98 - 111 mmol/L   CO2 24 22 - 32 mmol/L   Glucose, Bld 111 (H) 70 - 99 mg/dL    Comment: Glucose reference range applies only to samples taken after fasting for at least 8 hours.   BUN 33 (H) 8 - 23 mg/dL   Creatinine, Ser 1.67 (H) 0.44 - 1.00 mg/dL   Calcium 7.7 (L) 8.9 - 10.3 mg/dL   Total Protein 6.1 (L) 6.5 - 8.1 g/dL   Albumin 3.1 (L) 3.5 - 5.0 g/dL   AST 29 15 - 41 U/L   ALT 14 0 - 44 U/L   Alkaline Phosphatase 101 38 - 126 U/L   Total Bilirubin 0.4 0.3 - 1.2 mg/dL   GFR, Estimated 29 (L) >60 mL/min    Comment: (NOTE) Calculated using the CKD-EPI Creatinine Equation (2021)    Anion gap 10 5 - 15    Comment: Performed at [facility], [address] [place], [place] [address]  Protime-INR     Status: None   Collection  Time: 08/10/20 10:19 AM  Result Value Ref Range   Prothrombin Time 13.4 11.4 - 15.2 seconds   INR 1.1 0.8 - 1.2    Comment: (NOTE) INR goal varies based on device and disease states. Performed at [facility], [address] [place], [place] [address]   Type and screen [place]     Status: None   Collection Time: 08/10/20 10:19 AM  Result Value Ref Range   ABO/RH(D) B POS    Antibody Screen NEG    Sample Expiration 08/13/2020,2359    Unit Number [id number]    Blood Component Type RED CELLS,LR    Unit division 00    Status of Unit ISSUED,FINAL    Transfusion Status OK TO TRANSFUSE    Crossmatch Result      Compatible Performed at [facility], [address] [place], [place] [address]   Prepare RBC (crossmatch)     Status: None   Collection Time: 08/10/20 10:41 AM  Result Value Ref Range   Order Confirmation      ORDER PROCESSED BY BLOOD BANK Performed at [facility], [address] [place],  [address]   Resp Panel by RT-PCR (Flu A&B, Covid) Nasopharyngeal Swab     Status: None   Collection Time: 08/10/20 11:24 AM   Specimen: Nasopharyngeal Swab; Nasopharyngeal(NP) swabs in vial transport medium  Result Value Ref Range   SARS Coronavirus 2 by RT PCR NEGATIVE NEGATIVE    Comment: (NOTE) SARS-CoV-2 target nucleic acids are NOT DETECTED.  The SARS-CoV-2 RNA is generally detectable in upper respiratory specimens during the acute phase of infection. The lowest concentration of SARS-CoV-2 viral copies this assay can detect is 138 copies/mL. A negative result does not preclude SARS-Cov-2 infection and should not be used as the sole basis for treatment or other patient management decisions. A negative result may occur with  improper specimen collection/handling, submission of specimen other than nasopharyngeal swab, presence of viral mutation(s) within the areas targeted by this assay, and inadequate number of  viral copies(<138 copies/mL). A negative result must be combined with clinical observations, patient history, and epidemiological information. The expected result is Negative.  Fact Sheet for Patients:  EntrepreneurPulse.com.au  Fact Sheet for Healthcare Providers:  IncredibleEmployment.be  This test is no  t yet approved or cleared by the [place] and  has been authorized for detection and/or diagnosis of SARS-CoV-2 by FDA under an Emergency Use Authorization (EUA). This EUA will remain  in effect (meaning this test can be used) for the duration of the COVID-19 declaration under Section 564(b)(1) of the Act, 21 U.S.C.section 360bbb-3(b)(1), unless the authorization is terminated  or revoked sooner.       Influenza A by PCR NEGATIVE NEGATIVE   Influenza B by PCR NEGATIVE NEGATIVE    Comment: (NOTE) The Xpert Xpress SARS-CoV-2/FLU/RSV plus assay is intended as an aid in the diagnosis of influenza from Nasopharyngeal swab specimens and should not be used as a sole basis for treatment. Nasal washings and aspirates are unacceptable for Xpert Xpress SARS-CoV-2/FLU/RSV testing.  Fact Sheet for Patients: EntrepreneurPulse.com.au  Fact Sheet for Healthcare Providers: IncredibleEmployment.be  This test is not yet approved or cleared by the [place] FDA and has been authorized for detection and/or diagnosis of SARS-CoV-2 by FDA under an Emergency Use Authorization (EUA). This EUA will remain in effect (meaning this test can be used) for the duration of the COVID-19 declaration under Section 564(b)(1) of the Act, 21 U.S.C. section 360bbb-3(b)(1), unless the authorization is terminated or revoked.  Performed at [facility], [address] [place], [place] [address]   ABO/Rh     Status: None   Collection Time: 08/10/20 12:15 PM  Result Value Ref Range   ABO/RH(D)      B POS Performed  at [facility], [address] [place], [place] [address]   Hemoglobin and hematocrit, blood     Status: Abnormal   Collection Time: 08/10/20  5:11 PM  Result Value Ref Range   Hemoglobin 8.5 (L) 12.0 - 15.0 g/dL   HCT 26.3 (L) 36.0 - 46.0 %    Comment: Performed at [facility], [address] [place], [place] [address]  Hemoglobin and hematocrit, blood     Status: Abnormal   Collection Time: 08/10/20  8:14 PM  Result Value Ref Range   Hemoglobin 8.3 (L) 12.0 - 15.0 g/dL   HCT 25.9 (L) 36.0 - 46.0 %    Comment: Performed at [facility], [address] [place], [place] [address]  Hemoglobin and hematocrit, blood     Status: Abnormal   Collection Time: 08/10/20  8:48 PM  Result Value Ref Range   Hemoglobin 8.4 (L) 12.0 - 15.0 g/dL   HCT 26.2 (L) 36.0 - 46.0 %    Comment: Performed at [facility], [address], [place], [place] [address]  Comprehensive metabolic panel     Status: Abnormal   Collection Time: 08/11/20  5:59 AM  Result Value Ref Range   Sodium 135 135 - 145 mmol/L   Potassium 5.0 3.5 - 5.1 mmol/L   Chloride 104 98 - 111 mmol/L   CO2 25 22 - 32 mmol/L   Glucose, Bld 81 70 - 99 mg/dL    Comment: Glucose reference range applies only to samples taken after fasting for at least 8 hours.   BUN 29 (H) 8 - 23 mg/dL   Creatinine, Ser 1.40 (H) 0.44 - 1.00 mg/dL   Calcium 7.3 (L) 8.9 - 10.3 mg/dL   Total Protein 5.2 (L) 6.5 - 8.1 g/dL   Albumin 2.6 (L) 3.5 - 5.0 g/dL   AST 26 15 - 41 U/L   ALT 13 0 - 44 U/L   Alkaline Phosphatase 84 38 -  126 U/L   Total Bilirubin 0.9 0.3 - 1.2 mg/dL   GFR, Estimated 36 (L) >60 mL/min    Comment: (NOTE) Calculated using the CKD-EPI Creatinine Equation (2021)    Anion gap 6 5 - 15    Comment: Performed at [facility], [address] [place], [place] [address]  CBC     Status: Abnormal   Collection Time: 08/11/20  5:59 AM  Result Value Ref Range   WBC 8.3 4.0 - 10.5 K/uL   RBC  2.90 (L) 3.87 - 5.11 MIL/uL   Hemoglobin 8.3 (L) 12.0 - 15.0 g/dL   HCT 25.9 (L) 36.0 - 46.0 %   MCV 89.3 80.0 - 100.0 fL   MCH 28.6 26.0 - 34.0 pg   MCHC 32.0 30.0 - 36.0 g/dL   RDW 18.6 (H) 11.5 - 15.5 %   Platelets 418 (H) 150 - 400 K/uL   nRBC 0.0 0.0 - 0.2 %    Comment: Performed at [facility], [address] [place], [place] [address]  Hemoglobin     Status: Abnormal   Collection Time: 08/11/20  4:17 PM  Result Value Ref Range   Hemoglobin 8.4 (L) 12.0 - 15.0 g/dL    Comment: Performed at [facility], [address], [place], [place] [address]    No results found.  Review of Systems  Constitutional: Negative for chills and fever.  HENT: Negative for congestion, hearing loss and sinus pain.   Respiratory: Negative for cough, shortness of breath and wheezing.   Cardiovascular: Negative for chest pain, palpitations and leg swelling.  Gastrointestinal: Negative for abdominal pain, constipation, diarrhea, nausea and vomiting.  Genitourinary: Negative for dysuria, flank pain, frequency, hematuria and urgency.  Musculoskeletal: Negative for back pain.  Skin: Negative for rash.  Neurological: Negative for dizziness and headaches.  Psychiatric/Behavioral: Negative for suicidal ideas. The patient is not nervous/anxious.    Blood pressure (!) 147/58, pulse 70, temperature 98.8 F (37.1 C), temperature source Oral, resp. rate 17, height 5' (1.524 m), weight 46 kg, SpO2 98 %. Physical Exam Vitals and nursing note reviewed.  Constitutional:      Appearance: She is well-developed.  HENT:     Head: Normocephalic and atraumatic.  Cardiovascular:     Rate and Rhythm: Normal rate and regular rhythm.  Pulmonary:     Effort: Pulmonary effort is normal.     Breath sounds: Normal breath sounds.  Abdominal:     General: Bowel sounds are normal.     Palpations: Abdomen is soft.  Genitourinary:    Comments: External: Normal appearing vulva. No lesions noted.   Speculum examination: Normal appearing vaginal cuff. No o blood in the vaginal vault.   Bimanual examination: uterus is absent. No adnexal masses noted. Rectovaginal exam showed a normal thin vaginal wall. Palpable rectal mass, firm with irregular borders, seems pedunculated. No stool on rectal digit, only bright red blood. Vaginal finger without blood.  Musculoskeletal:        General: Normal range of motion.  Skin:    General: Skin is warm and dry.  Neurological:     Mental Status: She is alert and oriented to person, place, and time.  Psychiatric:        Behavior: Behavior normal.        Thought Content: Thought content normal.        Judgment: Judgment normal.     Assessment/Plan: 87 yo with rectal bleeding and rectal mass palpable on rectal exam.  GI is following. CT abdomen and pelvis is to be performed tonight. When I was in the room the patient was drinking her oral contrast.  No evidence of vaginal involvement. Will follow for CT results. If no issues with possible remaining ovaries will sign off.  Please call if new concerns arise.   Thank you for this consult on this nice patient.   More than 30 minutes were spent face to face with the patient in the room, reviewing the medical record, labs and images, and coordinating care for the patient. The plan of management was discussed in detail and counseling was provided.    [place] Trygg Mantz 08/11/2020, 9:07 PM

## 2020-08-11 NOTE — Progress Notes (Addendum)
Pt drank almost 2000 ml of prep so far and could not tolerate drinking so far. Pt is nauseous and vomiting clear emesis. Medicated with zofran. Pt encouraged to drink more and pt refused. Chat message sent to Dr Leslye Peer. Will monitor bowel movement.  At [place], pt had small black loose bowel movement. Nausea persist. MD on call notified. Ordered phernergan. Pt denies allergy to phenergan.  At [place], pt said nausea is much better after IV phenergan  At 0400, pt was incontinent of large amount of liquid black stool and urine. Incontinent care provided.

## 2020-08-11 NOTE — Progress Notes (Signed)
Patient ID: Lynn Rosario, female   DOB: 1929-03-05, 87 y.o.   MRN: [id number] [facility] PROGRESS NOTE  Lynn Rosario EXN:[id number] DOB: Aug 23, 1928 DOA: 08/10/2020 PCP: Lynn Daisy, MD  HPI/Subjective: Patient this morning stated she is hungry.  No further bleeding since last night.  No abdominal pain.  No nausea or vomiting.  Admitted with diverticular bleed and received 2 units of packed red blood cells.  Objective: Vitals:   08/11/20 0843 08/11/20 1143  BP: (!) 142/62 (!) 130/48  Pulse: 66 67  Resp: 18 18  Temp: 98.1 F (36.7 C) 97.8 F (36.6 C)  SpO2: 100% 100%    Intake/Output Summary (Last 24 hours) at 08/11/2020 1542 Last data filed at 08/11/2020 1100 Gross per 24 hour  Intake --  Output 175 ml  Net -175 ml   Filed Weights   08/10/20 0958 08/10/20 1732 08/11/20 0523  Weight: 47.2 kg 49.5 kg 46 kg    ROS: Review of Systems  Respiratory: Negative for shortness of breath.   Cardiovascular: Negative for chest pain.  Gastrointestinal: Negative for abdominal pain, nausea and vomiting.   Exam: Physical Exam HENT:     Head: Normocephalic.     Mouth/Throat:     Pharynx: No oropharyngeal exudate.  Eyes:     General: Lids are normal.     Conjunctiva/sclera: Conjunctivae normal.  Cardiovascular:     Rate and Rhythm: Normal rate and regular rhythm.     Heart sounds: S1 normal and S2 normal. Murmur heard.   Systolic murmur is present with a grade of 2/6.   Pulmonary:     Breath sounds: Normal breath sounds. No decreased breath sounds, wheezing, rhonchi or rales.  Abdominal:     Palpations: Abdomen is soft.     Tenderness: There is no abdominal tenderness.  Musculoskeletal:     Right lower leg: No swelling.     Left lower leg: No swelling.  Skin:    General: Skin is warm.     Findings: No rash.  Neurological:     Mental Status: She is alert and oriented to person, place, and time.       Data Reviewed: Basic Metabolic Panel: Recent Labs  Lab  08/10/20 1019 08/11/20 0559  NA 136 135  K 4.6 5.0  CL 102 104  CO2 24 25  GLUCOSE 111* 81  BUN 33* 29*  CREATININE 1.67* 1.40*  CALCIUM 7.7* 7.3*   Liver Function Tests: Recent Labs  Lab 08/10/20 1019 08/11/20 0559  AST 29 26  ALT 14 13  ALKPHOS 101 84  BILITOT 0.4 0.9  PROT 6.1* 5.2*  ALBUMIN 3.1* 2.6*   CBC: Recent Labs  Lab 08/10/20 1019 08/10/20 1711 08/10/20 2014 08/10/20 2048 08/11/20 0559  WBC 11.6*  --   --   --  8.3  NEUTROABS 8.9*  --   --   --   --   HGB 6.9* 8.5* 8.3* 8.4* 8.3*  HCT 22.7* 26.3* 25.9* 26.2* 25.9*  MCV 91.9  --   --   --  89.3  PLT 508*  --   --   --  418*   BNP (last 3 results) Recent Labs    07/15/20 2106  BNP 570.9*      Recent Results (from the past 240 hour(s))  Resp Panel by RT-PCR (Flu A&B, Covid) Nasopharyngeal Swab     Status: None   Collection Time: 08/10/20 11:24 AM   Specimen: Nasopharyngeal Swab; Nasopharyngeal(NP) swabs in vial transport medium  Result Value Ref Range Status   SARS Coronavirus 2 by RT PCR NEGATIVE NEGATIVE Final    Comment: (NOTE) SARS-CoV-2 target nucleic acids are NOT DETECTED.  The SARS-CoV-2 RNA is generally detectable in upper respiratory specimens during the acute phase of infection. The lowest concentration of SARS-CoV-2 viral copies this assay can detect is 138 copies/mL. A negative result does not preclude SARS-Cov-2 infection and should not be used as the sole basis for treatment or other patient management decisions. A negative result may occur with  improper specimen collection/handling, submission of specimen other than nasopharyngeal swab, presence of viral mutation(s) within the areas targeted by this assay, and inadequate number of viral copies(<138 copies/mL). A negative result must be combined with clinical observations, patient history, and epidemiological information. The expected result is Negative.  Fact Sheet for Patients:   EntrepreneurPulse.com.au  Fact Sheet for Healthcare Providers:  IncredibleEmployment.be  This test is no t yet approved or cleared by the [place] FDA and  has been authorized for detection and/or diagnosis of SARS-CoV-2 by FDA under an Emergency Use Authorization (EUA). This EUA will remain  in effect (meaning this test can be used) for the duration of the COVID-19 declaration under Section 564(b)(1) of the Act, 21 U.S.C.section 360bbb-3(b)(1), unless the authorization is terminated  or revoked sooner.       Influenza A by PCR NEGATIVE NEGATIVE Final   Influenza B by PCR NEGATIVE NEGATIVE Final    Comment: (NOTE) The Xpert Xpress SARS-CoV-2/FLU/RSV plus assay is intended as an aid in the diagnosis of influenza from Nasopharyngeal swab specimens and should not be used as a sole basis for treatment. Nasal washings and aspirates are unacceptable for Xpert Xpress SARS-CoV-2/FLU/RSV testing.  Fact Sheet for Patients: EntrepreneurPulse.com.au  Fact Sheet for Healthcare Providers: IncredibleEmployment.be  This test is not yet approved or cleared by the [place] FDA and has been authorized for detection and/or diagnosis of SARS-CoV-2 by FDA under an Emergency Use Authorization (EUA). This EUA will remain in effect (meaning this test can be used) for the duration of the COVID-19 declaration under Section 564(b)(1) of the Act, 21 U.S.C. section 360bbb-3(b)(1), unless the authorization is terminated or revoked.  Performed at [facility], [address] [place], [place] [address]       Scheduled Meds: . acetaminophen  650 mg Oral QPC lunch  . levothyroxine  75 mcg Oral Q0600  . multivitamin with minerals  1 tablet Oral Daily  . pantoprazole (PROTONIX) IV  40 mg Intravenous Q12H  . sodium bicarbonate  650 mg Oral BID   Continuous Infusions: . sodium chloride       Assessment/Plan:  1. Acute blood loss anemia.  Initially thought to be GI in nature but nurse was in the room with her just now when she had vaginal bleeding.  Patient was transfused a couple units of blood in the emergency room..  Hemoglobin 6.9 and came up in the low eights.  Hemoglobin seem to have stabilized in the low eights.  Continue to monitor.  A bleeding scan was ordered but they did not call in the tech yesterday.  Since bleeding is stopped the bleeding scan will likely be negative so I canceled this test.  With vaginal bleeding, I will consult gynecology.  Get another hemoglobin now and again in the morning.  IV iron today. 2. Acute kidney injury on chronic kidney disease stage IIIb.  Creatinine 1.67 on admission came down to 1.40.  Baseline creatinine around 1.27.  On sodium bicarb tablets 3. Essential hypertension.  Holding Norvasc and Lasix. 4. Chronic pain on Norco 5. Hypothyroidism unspecified on levothyroxine      Code Status:     Code Status Orders  (From admission, onward)         Start     Ordered   08/10/20 1556  Do not attempt resuscitation (DNR)  Continuous       Question Answer Comment  In the event of cardiac or respiratory ARREST Do not call a "code blue"   In the event of cardiac or respiratory ARREST Do not perform Intubation, CPR, defibrillation or ACLS   In the event of cardiac or respiratory ARREST Use medication by any route, position, wound care, and other measures to relive pain and suffering. May use oxygen, suction and manual treatment of airway obstruction as needed for comfort.      08/10/20 1556        Code Status History    Date Active Date Inactive Code Status Order ID Comments User Context   08/10/2020 1453 08/10/2020 1556 Full Code [id number]  Clance Boll, MD ED   Advance Care Planning Activity    Advance Directive Documentation   Flowsheet Row Most Recent Value  Type of Advance Directive Healthcare Power of [place]   Pre-existing out of facility DNR order (yellow form or pink MOST form) --  "MOST" Form in Place? --     Family Communication: Spoke with daughter at the bedside Disposition Plan: Status is: Inpatient  Dispo: The patient is from: Home              Anticipated d/c is to: Home              Patient currently having vaginal bleeding.  We will get a hemoglobin now and tomorrow morning.   Difficult to place patient.  Hopefully not  Time spent: 29 minutes  [place]

## 2020-08-11 NOTE — Progress Notes (Signed)
Patient ID: Lynn Rosario, female   DOB: March 02, 1929, 87 y.o.   MRN: [id number]  Nursing called me back that she had some rectal bleeding.  Earlier I called me about vaginal bleeding.  I came back to do a rectal exam.  On my exam I felt a mass and when I took my finger out dark blood came out.  Family interested in figuring out where the bleeding is coming from.  I called back gastroenterology team and she is interested and a procedure to figure out what is going on.  We will still proceed with CT scan tonight  Updated gynecologist.  Family still interested in making sure no vaginal bleeding.  Repeat hemoglobin still stable.  Any further bleeding will have to recheck hemoglobin.  Low threshold for transfusion.  Dr Loletha Grayer

## 2020-08-11 NOTE — Progress Notes (Signed)
GI Inpatient Follow-up Note  Subjective:  Patient seen in follow-up for acute blood loss anemia, hematochezia. No acute events overnight. No further bleeding since last night. She denies fever, chills, abdominal pain, nausea, vomiting. She is s/p 2 units pRBCs. Hemoglobin 8.4 this afternoon. There was vaginal bleeding seen today per nurse report and CT has been ordered.   Scheduled Inpatient Medications:  . acetaminophen  650 mg Oral QPC lunch  . bisacodyl  10 mg Oral Once  . levothyroxine  75 mcg Oral Q0600  . multivitamin with minerals  1 tablet Oral Daily  . pantoprazole (PROTONIX) IV  40 mg Intravenous Q12H  . polyethylene glycol-electrolytes  4,000 mL Oral Once  . sodium bicarbonate  650 mg Oral BID    Continuous Inpatient Infusions:   . sodium chloride      PRN Inpatient Medications:  albuterol, ALPRAZolam, alum & mag hydroxide-simeth, HYDROcodone-acetaminophen, ondansetron **OR** ondansetron (ZOFRAN) IV  Review of Systems: Constitutional: Weight is stable.  Eyes: No changes in vision. ENT: No oral lesions, sore throat.  GI: see HPI.  Heme/Lymph: No easy bruising.  CV: No chest pain.  GU: No hematuria.  Integumentary: No rashes.  Neuro: No headaches.  Psych: No depression/anxiety.  Endocrine: No heat/cold intolerance.  Allergic/Immunologic: No urticaria.  Resp: No cough, SOB.  Musculoskeletal: No joint swelling.    Physical Examination: BP (!) 136/52 (BP Location: Right Arm)   Pulse 64   Temp 97.9 F (36.6 C)   Resp 18   Ht 5' (1.524 m)   Wt 46 kg   SpO2 100%   BMI 19.81 kg/m  Gen: NAD, alert and oriented x 4 HEENT: PEERLA, EOMI, Neck: supple, no JVD or thyromegaly Chest: CTA bilaterally, no wheezes, crackles, or other adventitious sounds CV: RRR, no m/g/c/r Abd: soft, NT, ND, +BS in all four quadrants; no HSM, guarding, ridigity, or rebound tenderness Ext: no edema, well perfused with 2+ pulses, Skin: no rash or lesions noted Lymph: no  LAD  Data: Lab Results  Component Value Date   WBC 8.3 08/11/2020   HGB 8.4 (L) 08/11/2020   HCT 25.9 (L) 08/11/2020   MCV 89.3 08/11/2020   PLT 418 (H) 08/11/2020   Recent Labs  Lab 08/10/20 2048 08/11/20 0559 08/11/20 1617  HGB 8.4* 8.3* 8.4*   Lab Results  Component Value Date   NA 135 08/11/2020   K 5.0 08/11/2020   CL 104 08/11/2020   CO2 25 08/11/2020   BUN 29 (H) 08/11/2020   CREATININE 1.40 (H) 08/11/2020   Lab Results  Component Value Date   ALT 13 08/11/2020   AST 26 08/11/2020   ALKPHOS 84 08/11/2020   BILITOT 0.9 08/11/2020   Recent Labs  Lab 08/10/20 1019  INR 1.1   Assessment/Plan: 87 y/o Caucasian female with a PMH of HTN, hypothyroidism, chronic anemia, chronic insomnia, lumbar stenosis with neurogenic claudication, and CKD Stage III presented to the [facility] ED this morning for hematochezia  1. Hematochezia  2. Acute blood loss/Symptomatic anemia  3. Iron-deficiency anemia/AOCD  4. Vaginal bleeding - CT has been ordered for further work-up  -Clinical presentation and labs consistent with a lower GI bleed. -Differential includes diverticular bleed, stercoral changes, AVMs, anal outlet etiology from internal hemorrhoids, ischemic colitis, infectious colitis, IBD, malignancy, etc -Per Dr. Marshia Ly report, mass palpated in rectum today which raises the suspicion for malignancy.   DNR Status   Recommendations:  1. Follow-up on results of CT scan 2. Clear liquid diet today. NPO after  midnight. 3. Plan for colonoscopy tomorrow with Dr. Alice Reichert for further diagnostic evaluation. Discussed procedures with daughter and patient today who are in agreement. 4. Bowel prep 1800 today.   I reviewed the risks (including bleeding, perforation, infection, anesthesia complications, cardiac/respiratory complications), benefits and alternatives of colonoscopy. Patient consents to proceed.    Please call with questions or concerns.    Octavia Bruckner,  PA-C [facility] Gastroenterology [phone] (Cell)

## 2020-08-11 NOTE — Progress Notes (Signed)
Patient ID: Lynn Rosario, female   DOB: 11-22-1928, 87 y.o.   MRN: [id number]  Case discussed with gynecology team and they recommended starting with a CT scan of the abdomen and pelvis.  follow up later today.  Dr Loletha Grayer

## 2020-08-11 NOTE — Plan of Care (Signed)

## 2020-08-11 NOTE — Progress Notes (Signed)
PT Cancellation Note  Patient Details Name: Lynn Rosario MRN: [id number] DOB: 01-Jun-1928   Cancelled Treatment:    Reason Eval/Treat Not Completed: Other (comment).  PT consult received.  Chart reviewed.  Pt resting in bed upon PT arrival; family present who reports pt needing clean-up d/t incontinence in bed (had already called for nursing assist).  When therapist left room, nurse present assisting pt with clean-up.  Will re-attempt PT evaluation at a later date/time as able.  Leitha Bleak, PT 08/11/20, 3:44 PM

## 2020-08-11 NOTE — Plan of Care (Signed)

## 2020-08-11 NOTE — Progress Notes (Signed)
MD notified earlier this shiftof patient having vaginal bloody discharge as well as rectal bleeding, GI/GYN consulted. Hemoglobin result 8.4. Per MD patient to have CT of ABD/Pelvis. Patient is currently drinking oral Omnipaque solution for diagnostic. GI also ordered colonoscopy for 08/12/2020 and patient will begin bowel prep this evening. Patient denies pain or discomfort at this time. POC has been discussed w family per MD. No further concerns or questions at this time.

## 2020-08-12 ENCOUNTER — Encounter: Payer: Self-pay | Admitting: Internal Medicine

## 2020-08-12 ENCOUNTER — Telehealth: Payer: Self-pay | Admitting: Hematology and Oncology

## 2020-08-12 ENCOUNTER — Inpatient Hospital Stay: Payer: Medicare Other | Admitting: Registered Nurse

## 2020-08-12 ENCOUNTER — Encounter
Admission: EM | Disposition: A | Discharge status: Hospice | Payer: Self-pay | Source: Home / Self Care | Attending: Internal Medicine

## 2020-08-12 ENCOUNTER — Encounter: Payer: Self-pay | Admitting: Anesthesiology

## 2020-08-12 DIAGNOSIS — K6289 Other specified diseases of anus and rectum: Secondary | ICD-10-CM

## 2020-08-12 DIAGNOSIS — C787 Secondary malignant neoplasm of liver and intrahepatic bile duct: Secondary | ICD-10-CM

## 2020-08-12 HISTORY — PX: FLEXIBLE SIGMOIDOSCOPY: SHX5431

## 2020-08-12 LAB — URINE CULTURE

## 2020-08-12 LAB — CBC
HCT: 24.5 % — ABNORMAL LOW (ref 36.0–46.0)
Hemoglobin: 7.9 g/dL — ABNORMAL LOW (ref 12.0–15.0)
MCH: 28.9 pg (ref 26.0–34.0)
MCHC: 32.2 g/dL (ref 30.0–36.0)
MCV: 89.7 fL (ref 80.0–100.0)
Platelets: 382 10*3/uL (ref 150–400)
RBC: 2.73 MIL/uL — ABNORMAL LOW (ref 3.87–5.11)
RDW: 18 % — ABNORMAL HIGH (ref 11.5–15.5)
WBC: 13.3 10*3/uL — ABNORMAL HIGH (ref 4.0–10.5)
nRBC: 0 % (ref 0.0–0.2)

## 2020-08-12 LAB — PREPARE RBC (CROSSMATCH)

## 2020-08-12 SURGERY — SIGMOIDOSCOPY, FLEXIBLE

## 2020-08-12 SURGERY — SIGMOIDOSCOPY, FLEXIBLE
Anesthesia: General

## 2020-08-12 MED ORDER — LIDOCAINE HCL (PF) 2 % IJ SOLN
INTRAMUSCULAR | Status: AC
  Filled 2020-08-12: qty 5

## 2020-08-12 MED ORDER — SODIUM CHLORIDE 0.9 % IV SOLN
12.5000 mg | Freq: Four times a day (QID) | INTRAVENOUS | Status: DC | PRN
  Administered 2020-08-12: 01:00:00 12.5 mg via INTRAVENOUS
  Filled 2020-08-12: qty 0.5

## 2020-08-12 MED ORDER — LIDOCAINE HCL (CARDIAC) PF 100 MG/5ML IV SOSY
PREFILLED_SYRINGE | INTRAVENOUS | Status: DC | PRN
  Administered 2020-08-12: 40 mg via INTRAVENOUS
  Administered 2020-08-12: 60 mg via INTRAVENOUS

## 2020-08-12 MED ORDER — PROPOFOL 500 MG/50ML IV EMUL
INTRAVENOUS | Status: AC
  Filled 2020-08-12: qty 50

## 2020-08-12 MED ORDER — PHENYLEPHRINE HCL (PRESSORS) 10 MG/ML IV SOLN
INTRAVENOUS | Status: DC | PRN
  Administered 2020-08-12 (×2): 100 ug via INTRAVENOUS

## 2020-08-12 MED ORDER — LEVOTHYROXINE SODIUM 50 MCG PO TABS
75.0000 ug | ORAL_TABLET | Freq: Once | ORAL | Status: AC
  Administered 2020-08-12: 75 ug via ORAL
  Filled 2020-08-12: qty 1

## 2020-08-12 MED ORDER — PROPOFOL 10 MG/ML IV BOLUS
INTRAVENOUS | Status: DC | PRN
  Administered 2020-08-12: 10 mg via INTRAVENOUS
  Administered 2020-08-12: 30 mg via INTRAVENOUS

## 2020-08-12 MED ORDER — SODIUM CHLORIDE 0.9 % IV SOLN: INTRAVENOUS | Status: DC

## 2020-08-12 MED ORDER — SODIUM CHLORIDE 0.9% IV SOLUTION: Freq: Once | INTRAVENOUS | Status: AC

## 2020-08-12 MED ORDER — PROPOFOL 500 MG/50ML IV EMUL
INTRAVENOUS | Status: DC | PRN
  Administered 2020-08-12: 100 ug/kg/min via INTRAVENOUS

## 2020-08-12 NOTE — Progress Notes (Signed)
PT Cancellation Note  Patient Details Name: Lynn Rosario MRN: [id number] DOB: 07-Apr-1929   Cancelled Treatment:    Reason Eval/Treat Not Completed: Other (comment).  Chart reviewed.  Discussed pt's status with MD prior to attempted PT session; pt cleared for therapy.  Pt resting in bed eating breakfast upon PT arrival; pt's family present.  Discussed trying to get OOB and attempt to go for a walk but pt declining PT at this time (pt reports she may be up for it later this evening).  Plan for blood transfusion today.  Will re-attempt PT evaluation at a later date/time as able.  Leitha Bleak, PT 08/12/20, 10:49 AM

## 2020-08-12 NOTE — Op Note (Signed)
[facility] Gastroenterology Patient Name: Lynn Rosario Procedure Date: 08/12/2020 4:19 PM MRN: [id number] Account #: [id number] Date of Birth: [birth year] Admit Type: Outpatient Age: 87 Room: [facility] ENDO ROOM 1 Gender: Female Note Status: Finalized Procedure:             Flexible Sigmoidoscopy Indications:           Hematochezia, Abnormal CT of the GI tract, Rectal mass Providers:             Benay Pike. Alice Reichert MD, MD Referring MD:          Sofie Hartigan (Referring MD) Medicines:             Propofol per Anesthesia Complications:         No immediate complications. Estimated blood loss:                         Minimal. Procedure:             Pre-Anesthesia Assessment:                        - The risks and benefits of the procedure and the                         sedation options and risks were discussed with the                         patient. All questions were answered and informed                         consent was obtained.                        - Patient identification and proposed procedure were                         verified prior to the procedure by the nurse. The                         procedure was verified in the procedure room.                        - ASA Grade Assessment: III - A patient with severe                         systemic disease.                        - After reviewing the risks and benefits, the patient                         was deemed in satisfactory condition to undergo the                         procedure.                        After obtaining informed consent, the scope was passed                         under direct  vision. The Colonoscope was introduced                         through the anus The procedure was aborted. The scope                         was not inserted. sigmoid colon Medications were                         rectum. The flexible sigmoidoscopy was technically                         difficult and  complex due to a partially obstructing                         mass. The quality of the bowel preparation was fair. Findings:      The perianal examination was normal.      The digital rectal exam revealed a 8 cm (diameter) firm rectal mass       palpated 2 to 10 cm from the anal verge. The mass was circumferential.      A fungating and infiltrative completely obstructing large mass was found       in the distal rectum. The mass was circumferential. The mass measured       eight cm in length. No bleeding was present. Biopsies were taken with a       cold forceps for histology.      Retroflexion in the rectum was not performed due to unusual anatomy. Impression:            - Preparation of the colon was fair.                        - Rectal mass 2 to 10 cm from the anal verge.                        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 Biopsied. Recommendation:        - Await pathology results.                        - The findings and recommendations were discussed with                         the patient and their family.                        - Observe the patient for next several hours.                        - Refer to an oncologist after pathology results                         reviewed. Procedure Code(s):     --- Professional ---                        [phone], 82, Sigmoidoscopy, flexible; with biopsy,  single or multiple Diagnosis Code(s):     --- Professional ---                        R93.3, Abnormal findings on diagnostic imaging of                         other parts of digestive tract                        K92.1, Melena (includes Hematochezia)                        K56.691, Other complete intestinal obstruction                        D49.0, Neoplasm of unspecified behavior of digestive                         system                        K62.89, Other specified diseases of anus and  rectum CPT copyright 2019 American Medical Association. All rights reserved. The codes documented in this report are preliminary and upon coder review may  be revised to meet current compliance requirements. Efrain Sella MD, MD 08/12/2020 4:56:32 PM This report has been signed electronically. Number of Addenda: 0 Note Initiated On: 08/12/2020 4:19 PM Total Procedure Duration: 0 hours 4 minutes 18 seconds  Estimated Blood Loss:  Estimated blood loss was minimal.      [facility]

## 2020-08-12 NOTE — Plan of Care (Signed)

## 2020-08-12 NOTE — H&P (View-Only) (Signed)
GI In-Patient Progress Note   Patient has refused colonoscopy this morning. She is now agreeable to flexible sigmoidoscopy so we can obtain biopsies of the rectal mass. Discussed flexible sigmoidoscopy procedure with patient and daughter over the telephone this afternoon. Discussed plan of care with nurse for instructions on giving 2 tap water enemas 30 minutes apart. NPO until after procedure.   I reviewed the risks (including bleeding, perforation, infection, anesthesia complications, cardiac/respiratory complications), benefits and alternatives of flexible sigmoidoscopy. Patient consents to proceed.    Octavia Bruckner, PA-C [facility]astroenter[facility]  [phone] (office)

## 2020-08-12 NOTE — Progress Notes (Signed)
GI In-Patient Progress Note   Patient has refused colonoscopy this morning. She is now agreeable to flexible sigmoidoscopy so we can obtain biopsies of the rectal mass. Discussed flexible sigmoidoscopy procedure with patient and daughter over the telephone this afternoon. Discussed plan of care with nurse for instructions on giving 2 tap water enemas 30 minutes apart. NPO until after procedure.   I reviewed the risks (including bleeding, perforation, infection, anesthesia complications, cardiac/respiratory complications), benefits and alternatives of flexible sigmoidoscopy. Patient consents to proceed.    Octavia Bruckner, PA-C [facility] Gastroenterology  [phone] (office)

## 2020-08-12 NOTE — Telephone Encounter (Signed)
Patient daughter called stating to cancel Thursday appts due to hospitalization.  Daughter wanted [place] to know that had to be admitted on Sunday and is now under Dr. Pasty Arch care at [facility]  She stated Dr. Earleen Newport will be reaching out with questions - hematology related....  Just FYI   Thank you

## 2020-08-12 NOTE — Plan of Care (Addendum)
PMT note:  [place] request noted. Patient is currently off unit at this time and per staff in endoscopy. Will f/u tomorrow.   No charge.

## 2020-08-12 NOTE — Anesthesia Procedure Notes (Signed)
Procedure Name: General with mask airway Performed by: Fletcher-Harrison, Graviela Nodal, CRNA Pre-anesthesia Checklist: Patient identified, Emergency Drugs available, Suction available and Patient being monitored Patient Re-evaluated:Patient Re-evaluated prior to induction Oxygen Delivery Method: Simple face mask Induction Type: IV induction Placement Confirmation: CO2 detector and positive ETCO2 Dental Injury: Teeth and Oropharynx as per pre-operative assessment        

## 2020-08-12 NOTE — Progress Notes (Signed)
Received patient back from GI procedure. VS WNL. PRBC completed per report from RN in procedures and VS were called to this writer for completion documentation. Patient denies pain or discomfort. Patient requests diet and MD notified (see order). Hygiene performed, no further concerns noted at this time. Will continue to monitor through he remainder of this writer's scheduled shift

## 2020-08-12 NOTE — TOC Initial Note (Signed)
Transition of Care [facility]) - Initial/Assessment Note    Patient Details  Name: Lynn Rosario MRN: [id number] Date of Birth: 08/17/1928  [facility]) CM/SW Contact:    Pete Pelt, RN Phone Number: 08/12/2020, 2:22 PM  Clinical Narrative: TOC in to see patient and daughter at bedside.  Patient amenable to discharge discussion with daughter.  Patient prefers to go home at discharge, daughter concerned about level of care and patient compliance with treatment.  Daughter states she is open to palliative care/hospice/SNF as recommended, however patient is refusing.  Spoke to patient and daughter at length, and patient is currently getting a transfusion and will be going for a procedure shortly.  Daughter would like to know the outcome of the procedure and then speak with family to discuss disposition further.  TOC contact information given to patient and daughter, no further questions at this time, awaiting outcome of testing as per patient and daughter request.  TOC to follow through discharge.                  Expected Discharge Plan:  (TBD) Barriers to Discharge: Continued Medical Work up   Patient Goals and CMS Choice        Expected Discharge Plan and Services Expected Discharge Plan:  (TBD)     Post Acute Care Choice: NA (TBD) Living arrangements for the past 2 months: Single Family Home                 DME Arranged:  (TBD)         HH Arranged:  (TBD)          Prior Living Arrangements/Services Living arrangements for the past 2 months: Single Family Home Lives with:: Self,Adult Children Patient language and need for interpreter reviewed:: Yes        Need for Family Participation in Patient Care: Yes (Comment)     Criminal Activity/Legal Involvement Pertinent to Current Situation/Hospitalization: No - Comment as needed  Activities of Daily Living Home Assistive Devices/Equipment: None ADL Screening (condition at time of admission) Patient's  cognitive ability adequate to safely complete daily activities?: Yes Is the patient deaf or have difficulty hearing?: No Does the patient have difficulty seeing, even when wearing glasses/contacts?: Yes Does the patient have difficulty concentrating, remembering, or making decisions?: Yes Patient able to express need for assistance with ADLs?: Yes Does the patient have difficulty dressing or bathing?: Yes Independently performs ADLs?: No Communication: Independent Dressing (OT): Independent Grooming: Independent Feeding: Independent Bathing: Independent with device (comment),Needs assistance Is this a change from baseline?: Change from baseline, expected to last <3 days Toileting: Needs assistance Is this a change from baseline?: Change from baseline, expected to last <3 days In/Out Bed: Needs assistance Is this a change from baseline?: Change from baseline, expected to last <3 days Walks in Home: Independent with device (comment) Does the patient have difficulty walking or climbing stairs?: Yes Weakness of Legs: Both Weakness of Arms/Hands: None  Permission Sought/Granted                  Emotional Assessment Appearance:: Appears stated age Attitude/Demeanor/Rapport: Gracious Affect (typically observed): Appropriate Orientation: : Oriented to Self,Oriented to Place,Oriented to Situation,Oriented to  Time Alcohol / Substance Use: Not Applicable Psych Involvement: No (comment)  Admission diagnosis:  Lower GI bleed [K92.2] Low hemoglobin [D64.9] Gastrointestinal hemorrhage, unspecified gastrointestinal hemorrhage type [K92.2] Patient Active Problem List   Diagnosis Date Noted  . Rectal mass   . Liver metastases (  [place])   . Acute blood loss anemia   . Vaginal bleeding   . Acute kidney injury superimposed on CKD ([place])   . Essential hypertension   . Other chronic pain   . Hypothyroidism   . Lower GI bleed 08/10/2020  . Iron deficiency anemia 05/12/2020  . Anemia due to  stage 3 chronic kidney disease ([place]) 08/24/2016  . Anemia in chronic kidney disease 02/24/2015   PCP:  Sherrin Daisy, MD Pharmacy:   [facility], [address] [place] [address] Phone: [phone] Fax: [phone]     Social Determinants of Health (SDOH) Interventions    Readmission Risk Interventions No flowsheet data found.

## 2020-08-12 NOTE — Progress Notes (Signed)
2 tap water enemas completed per MD order prior to GI procedure. Patient tolerated without difficulty

## 2020-08-12 NOTE — Anesthesia Preprocedure Evaluation (Signed)
Anesthesia Evaluation  Patient identified by MRN, date of birth, ID band Patient awake    Reviewed: Allergy & Precautions, H&P , NPO status , Patient's Chart, lab work & pertinent test results, reviewed documented beta blocker date and time   History of Anesthesia Complications Negative for: history of anesthetic complications  Airway Mallampati: III  TM Distance: >3 FB Neck ROM: full    Dental no notable dental hx. (+) Poor Dentition, Dental Advidsory Given, Partial Upper, Partial Lower, Missing   Pulmonary neg pulmonary ROS,    Pulmonary exam normal breath sounds clear to auscultation       Cardiovascular Exercise Tolerance: Good hypertension, (-) angina(-) Past MI and (-) Cardiac Stents Normal cardiovascular exam(-) dysrhythmias (-) Valvular Problems/Murmurs Rhythm:regular Rate:Normal     Neuro/Psych negative neurological ROS  negative psych ROS   GI/Hepatic negative GI ROS, Neg liver ROS,   Endo/Other  neg diabetesHypothyroidism   Renal/GU CRFRenal disease  negative genitourinary   Musculoskeletal   Abdominal   Peds  Hematology  (+) Blood dyscrasia, anemia ,   Anesthesia Other Findings Past Medical History: No date: Anemia No date: Hypertension No date: Hypothyroidism No date: Insomnia No date: Lumbar radiculitis No date: Lumbar spondylosis No date: Lumbar stenosis with neurogenic claudication No date: Stage III chronic kidney disease (HCC)   Reproductive/Obstetrics negative OB ROS                             Anesthesia Physical Anesthesia Plan  ASA: III  Anesthesia Plan: General   Post-op Pain Management:    Induction:   PONV Risk Score and Plan: 3 and TIVA and Propofol infusion  Airway Management Planned:   Additional Equipment:   Intra-op Plan:   Post-operative Plan:   Informed Consent: I have reviewed the patients History and Physical, chart, labs and  discussed the procedure including the risks, benefits and alternatives for the proposed anesthesia with the patient or authorized representative who has indicated his/her understanding and acceptance.     Dental Advisory Given  Plan Discussed with: Anesthesiologist, CRNA and Surgeon  Anesthesia Plan Comments:         Anesthesia Quick Evaluation

## 2020-08-12 NOTE — Interval H&P Note (Signed)
History and Physical Interval Note:  08/12/2020 4:21 PM  Lynn Rosario  has presented today for surgery, with the diagnosis of Rectal mass, Acute blood loss anemia.  The various methods of treatment have been discussed with the patient and family. After consideration of risks, benefits and other options for treatment, the patient has consented to  Procedure(s): FLEXIBLE SIGMOIDOSCOPY (N/A) as a surgical intervention.  The patient's history has been reviewed, patient examined, no change in status, stable for surgery.  I have reviewed the patient's chart and labs.  Questions were answered to the patient's satisfaction.     [place], [place]

## 2020-08-12 NOTE — Consult Note (Signed)
[facility]  Date of admission:  08/10/2020  Inpatient day:  08/12/2020  Consulting physician:  Dr Loletha Grayer  Reason for Consultation:  Metastatic rectal cancer.  Chief Complaint: Lynn Rosario is a 87 y.o. female with iron deficiency anemia who was admitted through the emergency room with rectal bleeding.  HPI: The patient has been followed in the hematology clinic since 05/13/2020.  At that time she was noted to have stage IIIb chronic kidney disease and iron deficiency.  She had been on oral iron for years.  She denied any bleeding.  Last colonoscopy was 15-20 years ago.  Concern was raised for possible chronic GI bleeding.  Ferritin was 21 with an iron saturation of 5%.  We discussed institution of Venofer.  She was initially resistant to to begin IV iron.  Follow-up was delayed.  She received weekly Venofer x2 (07/17/2020 - 07/23/2020).  Patient notes recent rectal bleeding.  She states that she has had intermittent blood in her stool.  She has had diarrhea.  On the day of presentation, she had significant blood in her Depends with the passage of large blood clots.  She felt weak.  EMS was called.  Initial hemoglobin was 6.9 with a prior value of 8.2 in 07/23/2020.  She has subsequently received 2 units of PRBCs.  Hemoglobin is 7.9 today.  Abdomen and pelvic CT scan on 08/11/2020 was limited by motion artifact.  Findings were concerning for metastatic colorectal carcinoma.  There were multiple hypoattenuating liver masses with a possible rectal mass with adjacent enlarged perirectal lymph nodes.  Mass appeared to cause a recto-rectal intussusception without evidence for obstruction.  It was a large hiatal hernia.  There was ill-defined prominence at the level of the pancreatic head.    She was initially hesitant to undergo colonoscopy.  Digital rectal exam revealed a 8 cm firm rectal mass palpated 2-10 cm from the anal verge.  Mass was circumferential.  Flexible  sigmoidoscopy by Dr. Alice Reichert today revealed a fungating and infiltrative completely obstructing large mass in the distal rectum.  Mass was circumferential.  Biopsies were obtained.  Symptomatically, the patient denies any pain.  She has diarrhea.  She continues to have some rectal bleeding.   Past Medical History:  Diagnosis Date  . Anemia   . Hypertension   . Hypothyroidism   . Insomnia   . Lumbar radiculitis   . Lumbar spondylosis   . Lumbar stenosis with neurogenic claudication   . Stage III chronic kidney disease [facility])     Past Surgical History:  Procedure Laterality Date  . ABDOMINAL HYSTERECTOMY    . APPENDECTOMY    . TOTAL KNEE ARTHROPLASTY Left 2002    Family History  Problem Relation Age of Onset  . Stroke Mother   . CAD Father   . Stomach cancer Sister   . Breast cancer Sister   . Breast cancer Sister   . Breast cancer Daughter     Social History:  reports that she has never smoked. She has never used smokeless tobacco. She reports that she does not drink alcohol and does not use drugs.  She denies any exposure to radiation or toxins. She has not worked in 15-17 years. She worked in a [employer]. The patient lives in [place]. She lives three doors down from her daughter and POA, Lynn Rosario. The patient was initially alone then later accompanied by Dr Alice Reichert.  I attempted to call her daughter multiple times without answer.  Allergies:  Allergies  Allergen Reactions  . Celecoxib Other (See Comments)    Decreased kidney function   . Clarithromycin Other (See Comments)    Loss of taste     Medications Prior to Admission  Medication Sig Dispense Refill  . ALPRAZolam (XANAX) 0.25 MG tablet     . aspirin EC 81 MG tablet Take 81 mg by mouth daily.     . furosemide (LASIX) 20 MG tablet Take 20 mg by mouth. Every other day    . HYDROcodone-acetaminophen (NORCO/VICODIN) 5-325 MG per tablet 1 tablet 3 (three) times daily.    Marland Kitchen levothyroxine (SYNTHROID)  75 MCG tablet TAKE 1 TABLET ON AN EMPTY STOMACH WITH A GLASS OF WATER AT LEAST 30 TO 60 MINUTES BEFORE BREAKFAST.    . Multiple Vitamin (MULTI-VITAMINS) TABS Take 1 tablet by mouth daily.     Marland Kitchen acetaminophen (TYLENOL) 325 MG tablet Take 650 mg by mouth daily after lunch.    Marland Kitchen amLODipine (NORVASC) 2.5 MG tablet     . Cetirizine HCl 10 MG CAPS Take 10 mg by mouth as needed.    . Cyanocobalamin 2500 MCG SUBL Place 1 tablet under the tongue daily.  (Patient not taking: No sig reported)    . ferrous sulfate 325 (65 FE) MG EC tablet Take 325 mg by mouth 2 (two) times daily.    . sodium bicarbonate 650 MG tablet Take 650 mg by mouth 2 (two) times daily. (Patient not taking: No sig reported)      Review of Systems  Constitutional: Positive for malaise/fatigue and weight loss (11 pounds since 05/2020). Negative for chills and fever.  HENT: Positive for hearing loss. Negative for congestion, ear discharge, nosebleeds, sinus pain and sore throat.   Eyes: Negative.  Negative for blurred vision, double vision and pain.  Respiratory: Positive for shortness of breath (on exertion). Negative for cough and sputum production.   Cardiovascular: Negative.  Negative for chest pain, palpitations and leg swelling.  Gastrointestinal: Positive for blood in stool and diarrhea. Negative for abdominal pain, nausea and vomiting.  Genitourinary: Negative.  Negative for dysuria, frequency and urgency.  Musculoskeletal: Positive for back pain and joint pain (knee). Negative for falls and myalgias.  Skin: Negative.  Negative for itching and rash.  Neurological: Positive for weakness (general). Negative for dizziness, tremors, sensory change, speech change, focal weakness and headaches.  Endo/Heme/Allergies: Negative.  Does not bruise/bleed easily.  Psychiatric/Behavioral: Negative.  Negative for depression and memory loss. The patient is not nervous/anxious and does not have insomnia.    Vitals:  Blood pressure 127/61,  pulse 74, temperature 98.2 F (36.8 C), resp. rate 19, height 5' (1.524 m), weight 101 lb 6.6 oz (46 kg), SpO2 97 %.   Physical Exam Vitals and nursing note reviewed.  Constitutional:      General: She is not in acute distress.    Appearance: She is not ill-appearing.  HENT:     Head: Normocephalic and atraumatic.     Mouth/Throat:     Mouth: Mucous membranes are moist.     Pharynx: Oropharynx is clear. No oropharyngeal exudate or posterior oropharyngeal erythema.  Eyes:     General: No scleral icterus.    Conjunctiva/sclera: Conjunctivae normal.     Pupils: Pupils are equal, round, and reactive to light.  Cardiovascular:     Rate and Rhythm: Normal rate and regular rhythm.  Pulmonary:     Effort: Pulmonary effort is normal. No respiratory distress.     Breath  sounds: Normal breath sounds. No wheezing, rhonchi or rales.  Abdominal:     General: Bowel sounds are normal. There is no distension.     Palpations: Abdomen is soft. There is no mass.     Tenderness: There is no abdominal tenderness. There is no guarding or rebound.  Musculoskeletal:        General: No swelling.     Cervical back: Neck supple.  Skin:    General: Skin is warm and dry.     Coloration: Skin is pale. Skin is not jaundiced.     Findings: No bruising, erythema or rash.  Neurological:     General: No focal deficit present.     Mental Status: She is alert and oriented to person, place, and time. Mental status is at baseline.  Psychiatric:        Mood and Affect: Mood normal.        Behavior: Behavior normal.        Thought Content: Thought content normal.        Judgment: Judgment normal.     Results for orders placed or performed during the hospital encounter of 08/10/20 (from the past 48 hour(s))  Hemoglobin and hematocrit, blood     Status: Abnormal   Collection Time: 08/10/20  8:48 PM  Result Value Ref Range   Hemoglobin 8.4 (L) 12.0 - 15.0 g/dL   HCT 26.2 (L) 36.0 - 46.0 %    Comment: Performed  at [facility], [address] [place], [place] [address]  Comprehensive metabolic panel     Status: Abnormal   Collection Time: 08/11/20  5:59 AM  Result Value Ref Range   Sodium 135 135 - 145 mmol/L   Potassium 5.0 3.5 - 5.1 mmol/L   Chloride 104 98 - 111 mmol/L   CO2 25 22 - 32 mmol/L   Glucose, Bld 81 70 - 99 mg/dL    Comment: Glucose reference range applies only to samples taken after fasting for at least 8 hours.   BUN 29 (H) 8 - 23 mg/dL   Creatinine, Ser 1.40 (H) 0.44 - 1.00 mg/dL   Calcium 7.3 (L) 8.9 - 10.3 mg/dL   Total Protein 5.2 (L) 6.5 - 8.1 g/dL   Albumin 2.6 (L) 3.5 - 5.0 g/dL   AST 26 15 - 41 U/L   ALT 13 0 - 44 U/L   Alkaline Phosphatase 84 38 - 126 U/L   Total Bilirubin 0.9 0.3 - 1.2 mg/dL   GFR, Estimated 36 (L) >60 mL/min    Comment: (NOTE) Calculated using the CKD-EPI Creatinine Equation (2021)    Anion gap 6 5 - 15    Comment: Performed at [facility], [address] [place], [place] [address]  CBC     Status: Abnormal   Collection Time: 08/11/20  5:59 AM  Result Value Ref Range   WBC 8.3 4.0 - 10.5 K/uL   RBC 2.90 (L) 3.87 - 5.11 MIL/uL   Hemoglobin 8.3 (L) 12.0 - 15.0 g/dL   HCT 25.9 (L) 36.0 - 46.0 %   MCV 89.3 80.0 - 100.0 fL   MCH 28.6 26.0 - 34.0 pg   MCHC 32.0 30.0 - 36.0 g/dL   RDW 18.6 (H) 11.5 - 15.5 %   Platelets 418 (H) 150 - 400 K/uL   nRBC 0.0 0.0 - 0.2 %    Comment: Performed at [facility], [address] [place], [place] [address]  Hemoglobin  Status: Abnormal   Collection Time: 08/11/20  4:17 PM  Result Value Ref Range   Hemoglobin 8.4 (L) 12.0 - 15.0 g/dL    Comment: Performed at [facility], [address] [place], [place] [address]  CBC     Status: Abnormal   Collection Time: 08/12/20  5:08 AM  Result Value Ref Range   WBC 13.3 (H) 4.0 - 10.5 K/uL   RBC 2.73 (L) 3.87 - 5.11 MIL/uL   Hemoglobin 7.9 (L) 12.0 - 15.0 g/dL   HCT 24.5 (L) 36.0 - 46.0 %   MCV 89.7 80.0 - 100.0  fL   MCH 28.9 26.0 - 34.0 pg   MCHC 32.2 30.0 - 36.0 g/dL   RDW 18.0 (H) 11.5 - 15.5 %   Platelets 382 150 - 400 K/uL   nRBC 0.0 0.0 - 0.2 %    Comment: Performed at [facility], [address] [place], [place] [address]  Prepare RBC (crossmatch)     Status: None   Collection Time: 08/12/20  8:00 AM  Result Value Ref Range   Order Confirmation      ORDER PROCESSED BY BLOOD BANK Performed at [facility], [address], [place], [place] [address]    CT ABDOMEN PELVIS WO CONTRAST  Result Date: 08/11/2020 CLINICAL DATA:  Rectal bleeding.  Possible vaginal bleeding. EXAM: CT ABDOMEN AND PELVIS WITHOUT CONTRAST TECHNIQUE: Multidetector CT imaging of the abdomen and pelvis was performed following the standard protocol without IV contrast. COMPARISON:  None. FINDINGS: Lower chest: There is compressive atelectasis at the lung bases.Aortic calcifications are noted. The heart size is normal. The intracardiac blood pool is hypodense relative to the adjacent myocardium consistent with anemia. There are small bilateral pleural effusions. Hepatobiliary: There are multiple indeterminate liver masses. The largest in the left hepatic lobe measures approximately 2.3 cm (axial series 2, image 16). The largest in the right hepatic lobe measures approximately 3.3 cm (axial series 2, image 22). Normal gallbladder.There is no biliary ductal dilation. Pancreas: There is questionable fullness the level of the head of the pancreas, poorly evaluated secondary to motion artifact and lack of IV contrast. Spleen: Unremarkable. Adrenals/Urinary Tract: --Adrenal glands: Unremarkable. --Right kidney/ureter: No hydronephrosis or radiopaque kidney stones. --Left kidney/ureter: No hydronephrosis or radiopaque kidney stones. --Urinary bladder: Unremarkable. Stomach/Bowel: --Stomach/Duodenum: The majority of the stomach is herniated into the thoracic cavity. --Small bowel: Unremarkable. --Colon: There is a large  amount of stool in the colon. There is a possible mass at the level of the rectum. At this level, there is a recto-rectal intussusception without evidence for an obstruction. There is scattered colonic diverticula without CT evidence for diverticulitis. Portions of the transverse colon are herniated into the thoracic cavity. --Appendix: Normal. Vascular/Lymphatic: Atherosclerotic calcification is present within the non-aneurysmal abdominal aorta, without hemodynamically significant stenosis. --No retroperitoneal lymphadenopathy. --No mesenteric lymphadenopathy. --there are enlarged perirectal lymph nodes (axial series 2, image 56). Reproductive: Status post hysterectomy. No adnexal mass. There is pelvic floor laxity with a probable rectocele. Other: No ascites or free air. The abdominal wall is normal. Musculoskeletal. There is a significant degenerative levoscoliosis of the lumbar spine with advanced degenerative changes throughout the lumbar spine. There are degenerative changes of both hips. There is no acute fracture. There is an area of sclerosis involving the anterior right iliac bone (axial series 2, image 41). IMPRESSION: 1. Exam significantly limited by motion artifact and lack of IV contrast. 2. Findings concerning for metastatic colorectal carcinoma. There are multiple  hypoattenuating liver masses as detailed above in addition to a possible rectal mass with adjacent enlarged perirectal lymph nodes. This rectal mass appears to cause a recto-rectal intussusception without evidence for obstruction. A repeat contrast enhanced CT would be useful for further evaluation. 3. Large hiatal hernia containing the majority of the stomach and portions of the transverse colon. 4. Ill-defined prominence at the level of the head of the pancreas. A follow-up contrast enhanced CT would be useful for further evaluation of this area. 5. Small bilateral pleural effusions. 6.  Aortic Atherosclerosis (ICD10-I70.0).  Electronically Signed   By: Constance Holster M.D.   On: 08/11/2020 22:20    Assessment:  The patient is a 87 y.o. woman with probable metastatic rectal cancer.  She has a long standing history of iron deficiency.  Abdomen and pelvic CT on 08/11/2020 was limited by motion artifact.  Findings were concerning for metastatic colorectal carcinoma.  There were multiple hypoattenuating liver masses (largest 3.3 cm) with a possible rectal mass with adjacent enlarged perirectal lymph nodes.  Mass appeared to cause a recto-rectal intussusception without evidence for obstruction.  There was a large hiatal hernia.  There was ill-defined prominence at the level of the pancreatic head.    Flexible sigmoidoscopy on 08/12/2020 revealed a fungating and infiltrative completely obstructing large mass in the distal rectum.  Mass was circumferential.  Digital rectal exam revealed a 8 cm firm rectal mass palpated 2-10 cm from the anal verge.  Symptomatically, she denies any abdominal pain.  She noes rectal bleeding.  She is unsure about what she would like to do.  Plan:   1.   Probable metastatic rectal cancer  Review findings of sigmoidoscopy and CT scans with patient.  Discuss likely metastatic disease based on liver lesions.   Consider chest CT without contrast to complete staging.   Check CEA (ordered).  Discuss pending biopsy for confirmation of adenocarcinoma of the rectum.  Discuss typical treatment with systemic chemotherapy followed by concurrent chemotherapy and radiation.  Discuss concern for near obstructing mass and patient's uncertainty about pursuing treatment.   Discuss consideration of a diverting colostomy.   Discuss palliative radiation.  Patient wishes to discuss with her daughter.  Anticipate follow-up meeting tomorrow (unable to reach daughter via phone). 2.  Iron deficiency anemia  Etiology secondary to bleeding from rectal mass.  She has received 2 units of PRBCs (last  08/12/2020).  She received IV iron on 08/11/2020.  Maintain hemoglobin 7-8.  Transfuse PRBCs as needed.  3.  Code status  DNR.  Thank you for allowing me to participate in Jalilah Wiltsie 's care.  I will follow her closely with you while hospitalized and after discharge in the outpatient department.   Lequita Asal, MD  08/12/2020, 8:37 PM

## 2020-08-12 NOTE — Progress Notes (Signed)
Pt incontinent of urine and large liquid black stool. Liquid stool continues to ooze from patients rectum. Incontinent care provided. Maintained pt on NPO.

## 2020-08-12 NOTE — Transfer of Care (Signed)
Immediate Anesthesia Transfer of Care Note  Patient: Lynn Rosario  Procedure(s) Performed: FLEXIBLE SIGMOIDOSCOPY (N/A )  Patient Location: Endoscopy Unit  Anesthesia Type:General  Level of Consciousness: awake, drowsy and patient cooperative  Airway & Oxygen Therapy: Patient Spontanous Breathing and Patient connected to face mask oxygen  Post-op Assessment: Report given to RN and Post -op Vital signs reviewed and stable  Post vital signs: Reviewed and stable  Last Vitals:  Vitals Value Taken Time  BP 134/61 08/12/20 1635  Temp    Pulse 79 08/12/20 1636  Resp 16 08/12/20 1636  SpO2 100 % 08/12/20 1636  Vitals shown include unvalidated device data.  Last Pain:  Vitals:   08/12/20 1542  TempSrc: Temporal  PainSc: 0-No pain      Patients Stated Pain Goal: 0 (06/84/03 3533)  Complications: No complications documented.

## 2020-08-12 NOTE — Progress Notes (Signed)
Patient ID: Lynn Rosario, female   DOB: [birth year], 87 y.o.   MRN: [id number] [facility] PROGRESS NOTE  Marene Gilliam YSA:[id number] DOB: 10-28-1928 DOA: 08/10/2020 PCP: Sherrin Daisy, MD  HPI/Subjective: Patient was very upset this morning and wanted her Xanax and pain medications.  She stated she does not want to do the colonoscopy.  Yesterday when I did a rectal exam I felt the rectal mass.  She is having bleeding from the rectal mass.  Objective: Vitals:   08/12/20 0804 08/12/20 1211  BP: 140/62 (!) 112/57  Pulse: 77 73  Resp: 16 17  Temp: 98.4 F (36.9 C) 98 F (36.7 C)  SpO2: 98% 96%    Intake/Output Summary (Last 24 hours) at 08/12/2020 1403 Last data filed at 08/12/2020 0408 Gross per 24 hour  Intake 1970 ml  Output --  Net 1970 ml   Filed Weights   08/10/20 0958 08/10/20 1732 08/11/20 0523  Weight: 47.2 kg 49.5 kg 46 kg    ROS: Review of Systems  Respiratory: Negative for shortness of breath.   Cardiovascular: Negative for chest pain.  Gastrointestinal: Negative for abdominal pain.   Exam: Physical Exam HENT:     Head: Normocephalic.     Mouth/Throat:     Pharynx: No oropharyngeal exudate.  Eyes:     General: Lids are normal.     Conjunctiva/sclera: Conjunctivae normal.  Cardiovascular:     Rate and Rhythm: Normal rate and regular rhythm.     Heart sounds: S1 normal and S2 normal. Murmur heard.   Systolic murmur is present with a grade of 2/6.   Pulmonary:     Breath sounds: No decreased breath sounds, wheezing, rhonchi or rales.  Abdominal:     Palpations: Abdomen is soft.     Tenderness: There is no abdominal tenderness.  Musculoskeletal:     Right lower leg: No swelling.     Left lower leg: No swelling.  Skin:    General: Skin is warm.     Findings: No rash.  Neurological:     Mental Status: She is alert and oriented to person, place, and time.       Data Reviewed: Basic Metabolic Panel: Recent Labs  Lab 08/10/20 1019  08/11/20 0559  NA 136 135  K 4.6 5.0  CL 102 104  CO2 24 25  GLUCOSE 111* 81  BUN 33* 29*  CREATININE 1.67* 1.40*  CALCIUM 7.7* 7.3*   Liver Function Tests: Recent Labs  Lab 08/10/20 1019 08/11/20 0559  AST 29 26  ALT 14 13  ALKPHOS 101 84  BILITOT 0.4 0.9  PROT 6.1* 5.2*  ALBUMIN 3.1* 2.6*   CBC: Recent Labs  Lab 08/10/20 1019 08/10/20 1711 08/10/20 2014 08/10/20 2048 08/11/20 0559 08/11/20 1617 08/12/20 0508  WBC 11.6*  --   --   --  8.3  --  13.3*  NEUTROABS 8.9*  --   --   --   --   --   --   HGB 6.9* 8.5* 8.3* 8.4* 8.3* 8.4* 7.9*  HCT 22.7* 26.3* 25.9* 26.2* 25.9*  --  24.5*  MCV 91.9  --   --   --  89.3  --  89.7  PLT 508*  --   --   --  418*  --  382   BNP (last 3 results) Recent Labs    07/15/20 2106  BNP 570.9*      Recent Results (from the past 240 hour(s))  Resp Panel by RT-PCR (  Flu A&B, Covid) Nasopharyngeal Swab     Status: None   Collection Time: 08/10/20 11:24 AM   Specimen: Nasopharyngeal Swab; Nasopharyngeal(NP) swabs in vial transport medium  Result Value Ref Range Status   SARS Coronavirus 2 by RT PCR NEGATIVE NEGATIVE Final    Comment: (NOTE) SARS-CoV-2 target nucleic acids are NOT DETECTED.  The SARS-CoV-2 RNA is generally detectable in upper respiratory specimens during the acute phase of infection. The lowest concentration of SARS-CoV-2 viral copies this assay can detect is 138 copies/mL. A negative result does not preclude SARS-Cov-2 infection and should not be used as the sole basis for treatment or other patient management decisions. A negative result may occur with  improper specimen collection/handling, submission of specimen other than nasopharyngeal swab, presence of viral mutation(s) within the areas targeted by this assay, and inadequate number of viral copies(<138 copies/mL). A negative result must be combined with clinical observations, patient history, and epidemiological information. The expected result is  Negative.  Fact Sheet for Patients:  EntrepreneurPulse.com.au  Fact Sheet for Healthcare Providers:  IncredibleEmployment.be  This test is no t yet approved or cleared by the [place] FDA and  has been authorized for detection and/or diagnosis of SARS-CoV-2 by FDA under an Emergency Use Authorization (EUA). This EUA will remain  in effect (meaning this test can be used) for the duration of the COVID-19 declaration under Section 564(b)(1) of the Act, 21 U.S.C.section 360bbb-3(b)(1), unless the authorization is terminated  or revoked sooner.       Influenza A by PCR NEGATIVE NEGATIVE Final   Influenza B by PCR NEGATIVE NEGATIVE Final    Comment: (NOTE) The Xpert Xpress SARS-CoV-2/FLU/RSV plus assay is intended as an aid in the diagnosis of influenza from Nasopharyngeal swab specimens and should not be used as a sole basis for treatment. Nasal washings and aspirates are unacceptable for Xpert Xpress SARS-CoV-2/FLU/RSV testing.  Fact Sheet for Patients: EntrepreneurPulse.com.au  Fact Sheet for Healthcare Providers: IncredibleEmployment.be  This test is not yet approved or cleared by the [place] FDA and has been authorized for detection and/or diagnosis of SARS-CoV-2 by FDA under an Emergency Use Authorization (EUA). This EUA will remain in effect (meaning this test can be used) for the duration of the COVID-19 declaration under Section 564(b)(1) of the Act, 21 U.S.C. section 360bbb-3(b)(1), unless the authorization is terminated or revoked.  Performed at [facility], [address], [place], [place] [address]   Urine culture     Status: Abnormal   Collection Time: 08/10/20  2:53 PM   Specimen: Urine, Random  Result Value Ref Range Status   Specimen Description   Final    URINE, RANDOM Performed at [facility], [address] [place], [place] [address]    Special  Requests   Final    NONE Performed at [facility], [address] [place], [place] [address]    Culture MULTIPLE SPECIES PRESENT, SUGGEST RECOLLECTION (A)  Final   Report Status 08/12/2020 FINAL  Final     Studies: CT ABDOMEN PELVIS WO CONTRAST  Result Date: 08/11/2020 CLINICAL DATA:  Rectal bleeding.  Possible vaginal bleeding. EXAM: CT ABDOMEN AND PELVIS WITHOUT CONTRAST TECHNIQUE: Multidetector CT imaging of the abdomen and pelvis was performed following the standard protocol without IV contrast. COMPARISON:  None. FINDINGS: Lower chest: There is compressive atelectasis at the lung bases.Aortic calcifications are noted. The heart size is normal. The intracardiac blood pool is hypodense relative to the adjacent myocardium consistent with anemia.  There are small bilateral pleural effusions. Hepatobiliary: There are multiple indeterminate liver masses. The largest in the left hepatic lobe measures approximately 2.3 cm (axial series 2, image 16). The largest in the right hepatic lobe measures approximately 3.3 cm (axial series 2, image 22). Normal gallbladder.There is no biliary ductal dilation. Pancreas: There is questionable fullness the level of the head of the pancreas, poorly evaluated secondary to motion artifact and lack of IV contrast. Spleen: Unremarkable. Adrenals/Urinary Tract: --Adrenal glands: Unremarkable. --Right kidney/ureter: No hydronephrosis or radiopaque kidney stones. --Left kidney/ureter: No hydronephrosis or radiopaque kidney stones. --Urinary bladder: Unremarkable. Stomach/Bowel: --Stomach/Duodenum: The majority of the stomach is herniated into the thoracic cavity. --Small bowel: Unremarkable. --Colon: There is a large amount of stool in the colon. There is a possible mass at the level of the rectum. At this level, there is a recto-rectal intussusception without evidence for an obstruction. There is scattered colonic diverticula without CT evidence for diverticulitis.  Portions of the transverse colon are herniated into the thoracic cavity. --Appendix: Normal. Vascular/Lymphatic: Atherosclerotic calcification is present within the non-aneurysmal abdominal aorta, without hemodynamically significant stenosis. --No retroperitoneal lymphadenopathy. --No mesenteric lymphadenopathy. --there are enlarged perirectal lymph nodes (axial series 2, image 56). Reproductive: Status post hysterectomy. No adnexal mass. There is pelvic floor laxity with a probable rectocele. Other: No ascites or free air. The abdominal wall is normal. Musculoskeletal. There is a significant degenerative levoscoliosis of the lumbar spine with advanced degenerative changes throughout the lumbar spine. There are degenerative changes of both hips. There is no acute fracture. There is an area of sclerosis involving the anterior right iliac bone (axial series 2, image 41). IMPRESSION: 1. Exam significantly limited by motion artifact and lack of IV contrast. 2. Findings concerning for metastatic colorectal carcinoma. There are multiple hypoattenuating liver masses as detailed above in addition to a possible rectal mass with adjacent enlarged perirectal lymph nodes. This rectal mass appears to cause a recto-rectal intussusception without evidence for obstruction. A repeat contrast enhanced CT would be useful for further evaluation. 3. Large hiatal hernia containing the majority of the stomach and portions of the transverse colon. 4. Ill-defined prominence at the level of the head of the pancreas. A follow-up contrast enhanced CT would be useful for further evaluation of this area. 5. Small bilateral pleural effusions. 6.  Aortic Atherosclerosis (ICD10-I70.0). Electronically Signed   By: Constance Holster M.D.   On: 08/11/2020 22:20    Scheduled Meds: . sodium chloride   Intravenous Once  . acetaminophen  650 mg Oral QPC lunch  . levothyroxine  75 mcg Oral Q0600  . multivitamin with minerals  1 tablet Oral Daily   . pantoprazole (PROTONIX) IV  40 mg Intravenous Q12H  . sodium bicarbonate  650 mg Oral BID   Continuous Infusions: . sodium chloride    . promethazine (PHENERGAN) injection 12.5 mg (08/12/20 0034)   Brief history.  87 year old female with chronic kidney disease and chronic anemia and hypothyroidism presented to the hospital with rectal bleeding.  At first declined GI procedure.  Nursing staff called me back but they think it was coming from the vagina.  I went back to do a rectal exam which I felt a rectal mass.  Gynecology did a vaginal exam which there was no blood from the vagina.  GI to evaluate for biopsy. Assessment/Plan:  1. Acute blood loss anemia.  Patient was given 2 units of packed red blood cells on presentation.  I will give another unit of packed red  blood cells today on hemoglobin of 7.9.  I gave IV iron yesterday.  Patient has a rectal mass on exam and likely will continue bleeding.  Gynecology ruled out vaginal bleeding. 2. Rectal mass and CT scan showing signs of metastatic disease to the liver.  GI hopefully will be able to get a biopsy today but patient was refusing as of this morning.  Case discussed with her oncologist Dr. Mike Gip to see.  Also placed a palliative care consultation since the patient is not interested in any surgical procedures. 3. Acute kidney injury on chronic kidney disease stage IIIb.  Creatinine 1.67 on admission.  Came down to 1.40 as of yesterday.  Her baseline creatinine is 1.27.  Patient on sodium bicarb tablets.  Check creatinine again tomorrow. 4. Essential hypertension.  Continue to hold Norvasc and Lasix 5. Chronic pain on Norco 6. Hypothyroidism unspecified on levothyroxine.        Code Status:     Code Status Orders  (From admission, onward)         Start     Ordered   08/10/20 1556  Do not attempt resuscitation (DNR)  Continuous       Question Answer Comment  In the event of cardiac or respiratory ARREST Do not call a "code  blue"   In the event of cardiac or respiratory ARREST Do not perform Intubation, CPR, defibrillation or ACLS   In the event of cardiac or respiratory ARREST Use medication by any route, position, wound care, and other measures to relive pain and suffering. May use oxygen, suction and manual treatment of airway obstruction as needed for comfort.      08/10/20 1556        Code Status History    Date Active Date Inactive Code Status Order ID Comments User Context   08/10/2020 1453 08/10/2020 1556 Full Code [id number]  Clance Boll, MD ED   Advance Care Planning Activity    Advance Directive Documentation   Flowsheet Row Most Recent Value  Type of Advance Directive Healthcare Power of [place]  Pre-existing out of facility DNR order (yellow form or pink MOST form) --  "MOST" Form in Place? --     Family Communication: Spoke with daughter at the bedside Disposition Plan: Status is: Inpatient  Dispo: The patient is from: Home              Anticipated d/c is to: To be determined              Patient currently still having rectal bleeding secondary to rectal mass   Difficult to place patient.  Hopefully not  Consultants:  Palliative care  Oncology  Gastroenterology  Gynecology  Time spent: 28 minutes  [place]

## 2020-08-13 ENCOUNTER — Encounter: Payer: Self-pay | Admitting: Internal Medicine

## 2020-08-13 DIAGNOSIS — Z7189 Other specified counseling: Secondary | ICD-10-CM

## 2020-08-13 LAB — BPAM RBC
Blood Product Expiration Date: 202204252359
Blood Product Expiration Date: 202204302359
ISSUE DATE / TIME: 202204031236
ISSUE DATE / TIME: 202204051400
Unit Type and Rh: 1700
Unit Type and Rh: 7300

## 2020-08-13 LAB — BASIC METABOLIC PANEL
Anion gap: 6 (ref 5–15)
BUN: 17 mg/dL (ref 8–23)
CO2: 24 mmol/L (ref 22–32)
Calcium: 7.5 mg/dL — ABNORMAL LOW (ref 8.9–10.3)
Chloride: 103 mmol/L (ref 98–111)
Creatinine, Ser: 1.35 mg/dL — ABNORMAL HIGH (ref 0.44–1.00)
GFR, Estimated: 38 mL/min — ABNORMAL LOW (ref >60)
Glucose, Bld: 83 mg/dL (ref 70–99)
Potassium: 4.4 mmol/L (ref 3.5–5.1)
Sodium: 133 mmol/L — ABNORMAL LOW (ref 135–145)

## 2020-08-13 LAB — TYPE AND SCREEN
ABO/RH(D): B POS
Antibody Screen: NEGATIVE
Unit division: 0
Unit division: 0

## 2020-08-13 LAB — CBC
HCT: 31.6 % — ABNORMAL LOW (ref 36.0–46.0)
Hemoglobin: 10.2 g/dL — ABNORMAL LOW (ref 12.0–15.0)
MCH: 28.6 pg (ref 26.0–34.0)
MCHC: 32.3 g/dL (ref 30.0–36.0)
MCV: 88.5 fL (ref 80.0–100.0)
Platelets: 377 K/uL (ref 150–400)
RBC: 3.57 MIL/uL — ABNORMAL LOW (ref 3.87–5.11)
RDW: 18.3 % — ABNORMAL HIGH (ref 11.5–15.5)
WBC: 9.1 K/uL (ref 4.0–10.5)
nRBC: 0 % (ref 0.0–0.2)

## 2020-08-13 NOTE — Progress Notes (Signed)
PT Cancellation Note  Patient Details Name: Yamna Mackel MRN: [id number] DOB: [birth year]   Cancelled Treatment:    Reason Eval/Treat Not Completed: Other (comment).  Pt currently not available for PT evaluation (Palliative Care in pt's room).  Will re-attempt PT evaluation at a later date/time.  Leitha Bleak, PT 08/13/20, 10:29 AM

## 2020-08-13 NOTE — Plan of Care (Signed)

## 2020-08-13 NOTE — Progress Notes (Signed)
GI Inpatient Follow-up Note  Subjective:  Patient seen in follow-up for lower GI bleeding 2/2 likely malignant rectal mass s/p flexible sigmoidoscopy yesterday. No acute event overnight. Pathology from rectal mass pending. Patient has been having a lot of diarrhea, dark-colored. She is tolerating diet. No new complaints today. She denies any nausea, vomiting, or abdominal pain.   Scheduled Inpatient Medications:  . acetaminophen  650 mg Oral QPC lunch  . levothyroxine  75 mcg Oral Q0600  . multivitamin with minerals  1 tablet Oral Daily  . pantoprazole (PROTONIX) IV  40 mg Intravenous Q12H  . sodium bicarbonate  650 mg Oral BID    Continuous Inpatient Infusions:   . sodium chloride    . promethazine (PHENERGAN) injection Stopped (08/13/20 0010)    PRN Inpatient Medications:  albuterol, ALPRAZolam, alum & mag hydroxide-simeth, HYDROcodone-acetaminophen, ondansetron **OR** ondansetron (ZOFRAN) IV, promethazine (PHENERGAN) injection  Review of Systems: Constitutional: Weight is stable.  Eyes: No changes in vision. ENT: No oral lesions, sore throat.  GI: see HPI.  Heme/Lymph: No easy bruising.  CV: No chest pain.  GU: No hematuria.  Integumentary: No rashes.  Neuro: No headaches.  Psych: No depression/anxiety.  Endocrine: No heat/cold intolerance.  Allergic/Immunologic: No urticaria.  Resp: No cough, SOB.  Musculoskeletal: No joint swelling.    Physical Examination: BP 127/62 (BP Location: Right Arm)   Pulse 60   Temp 98 F (36.7 C) (Oral)   Resp 17   Ht 5' (1.524 m)   Wt 46 kg   SpO2 98%   BMI 19.80 kg/m   Elderly female lying in hospital bed. Daughter in room.  Gen: NAD, alert and oriented x 4 HEENT: PEERLA, EOMI, Neck: supple, no JVD or thyromegaly Chest: CTA bilaterally, no wheezes, crackles, or other adventitious sounds CV: RRR, no m/g/c/r Abd: soft, NT, ND, +BS in all four quadrants; no HSM, guarding, ridigity, or rebound tenderness Ext: no edema, well  perfused with 2+ pulses, Skin: no rash or lesions noted Lymph: no LAD  Data: Lab Results  Component Value Date   WBC 9.1 08/13/2020   HGB 10.2 (L) 08/13/2020   HCT 31.6 (L) 08/13/2020   MCV 88.5 08/13/2020   PLT 377 08/13/2020   Recent Labs  Lab 08/11/20 1617 08/12/20 0508 08/13/20 0540  HGB 8.4* 7.9* 10.2*   Lab Results  Component Value Date   NA 133 (L) 08/13/2020   K 4.4 08/13/2020   CL 103 08/13/2020   CO2 24 08/13/2020   BUN 17 08/13/2020   CREATININE 1.35 (H) 08/13/2020   Lab Results  Component Value Date   ALT 13 08/11/2020   AST 26 08/11/2020   ALKPHOS 84 08/11/2020   BILITOT 0.9 08/11/2020   Recent Labs  Lab 08/10/20 1019  INR 1.1   Assessment/Plan:  87 y/o Caucasian female with a PMH ofHTN, hypothyroidism, chronic anemia, chronic insomnia, lumbar stenosis with neurogenic claudication, and CKD Stage IIIpresented to the [facility]  for hematochezia  1. Lower GI bleed - likely 2/2 rectal mass highly concerning for metastatic rectal cancer,, s/p flexible sigmoidoscopy yesterday afternoon with Dr. Alice Reichert  2. Acute blood loss/Symptomatic anemia - H&H stable s/p transfusions and IV Iron infusions  3. Iron-deficiency anemia/AOCD - stable  4. AKI on CKD Stage IIIb  DNR Status   Recommendations:  1. Findings suspicious for metastatic rectal cancer. Biopsies from rectal mass are still pending. 2. Case discussed between Dr. Alice Reichert, Dr. Mike Gip, and patient/family. Awaiting biopsy results before forming definitive treatment plan. 3.  H&H stable s/p transfusions and iron infusions. Continue to monitor H&H closely.  4. No further GI interventions needed at this time 5. GI will sign off at this time. Dr. Mike Gip will provider further recommendations after results of rectal mass biopsies are available   Please call with questions or concerns.    Octavia Bruckner, PA-C [facility] [phone] (Cell)

## 2020-08-13 NOTE — Anesthesia Postprocedure Evaluation (Signed)
Anesthesia Post Note  Patient: Lynn Rosario  Procedure(s) Performed: FLEXIBLE SIGMOIDOSCOPY (N/A )  Patient location during evaluation: Endoscopy Anesthesia Type: General Level of consciousness: awake and alert Pain management: pain level controlled Vital Signs Assessment: post-procedure vital signs reviewed and stable Respiratory status: spontaneous breathing, nonlabored ventilation, respiratory function stable and patient connected to nasal cannula oxygen Cardiovascular status: blood pressure returned to baseline and stable Postop Assessment: no apparent nausea or vomiting Anesthetic complications: no   No complications documented.   Last Vitals:  Vitals:   08/12/20 2022 08/12/20 2318  BP: 127/61 130/70  Pulse: 74 70  Resp: 19 20  Temp: 36.8 C 36.9 C  SpO2: 97% 97%    Last Pain:  Vitals:   08/12/20 2253  TempSrc:   PainSc: 0-No pain                 Martha Clan

## 2020-08-13 NOTE — TOC Progression Note (Signed)
Transition of Care [facility]) - Progression Note    Patient Details  Name: Lynn Rosario MRN: [id number] Date of Birth: 11-04-1928  Transition of Care [facility]) CM/SW [place], RN Phone Number: 08/13/2020, 1:44 PM  Clinical Narrative:    TOC in to see patient and daughter at bedside.  Both patient and daughter are waiting for Hem/Onc to see patient to discuss treatment options.  They both state that at that point, they can better determine patient disposition--as patient wants to go home but a SNF is recommended.  They ask to speak with TOC following that visit to plan discharge. TOC contact information given, TOC to follow through discharge.  Expected Discharge Plan:  (TBD) Barriers to Discharge: Continued Medical Work up  Expected Discharge Plan and Services Expected Discharge Plan:  (TBD)     Post Acute Care Choice: NA (TBD) Living arrangements for the past 2 months: Single Family Home                 DME Arranged:  (TBD)         HH Arranged:  (TBD)           Social Determinants of Health (SDOH) Interventions    Readmission Risk Interventions No flowsheet data found.

## 2020-08-13 NOTE — Consult Note (Signed)
Consultation Note Date: 08/13/2020   Patient Name: Recie Cirrincione  DOB: April 06, 1929  MRN: [id number]  Age / Sex: 87 y.o., female  PCP: Sherrin Daisy, MD Referring Physician: Dessa Phi, DO  Reason for Consultation: Establishing goals of care  HPI/Patient Profile: Joely Losier is an 87 year old female with PMHx of chronic kidney disease and chronic anemia and hypothyroidism who presented to the hospital with rectal bleeding.  Rectal exam revealed a rectal mass.  Patient received blood transfusion due to blood loss anemia.  Patient underwent flex sigmoidoscopy on 4/5, oncology consulted.  Clinical Assessment and Goals of Care: Patient is resting in bed. Her daughter is at bedside. Patient states she is widowed and has 2 children. She has been living alone until she became weak and stayed with her daughter just prior to her admission. Daughter states she is able to microwave, but they cook and bring her food and clean her house. She does not go up to the 2nd floor of the home any longer.    We discussed her diagnoses, prognosis, and GOC. The difference between an aggressive medical intervention path and a comfort care path was discussed.  Values and goals of care important to patient and family were attempted to be elicited.  Discussed limitations of medical interventions to prolong quality of life in some situations and discussed the concept of human mortality.  Daughter articulates well her mother's current status. She states they are waiting to speak with oncology to see what options they have before making any decisions. Patient states she does not want to live in a nursing facility. QOL and independence is very important to her. Daughter acknowledges that Ms. Cordaro cannot be forced to have heatlhcare she does not want.        SUMMARY OF RECOMMENDATIONS   Family waiting to speak with oncology.    Prognosis:   Poor      Primary Diagnoses: Present on Admission: . Lower GI bleed   I have reviewed the medical record, interviewed the patient and family, and examined the patient. The following aspects are pertinent.  Past Medical History:  Diagnosis Date  . Anemia   . Hypertension   . Hypothyroidism   . Insomnia   . Lumbar radiculitis   . Lumbar spondylosis   . Lumbar stenosis with neurogenic claudication   . Stage III chronic kidney disease ([place])    Social History   Socioeconomic History  . Marital status: Married    Spouse name: Not on file  . Number of children: Not on file  . Years of education: Not on file  . Highest education level: Not on file  Occupational History  . Not on file  Tobacco Use  . Smoking status: Never Smoker  . Smokeless tobacco: Never Used  Vaping Use  . Vaping Use: Never used  Substance and Sexual Activity  . Alcohol use: No    Alcohol/week: 0.0 standard drinks  . Drug use: No  . Sexual activity: Never  Other Topics Concern  . Not on  file  Social History Narrative  . Not on file   Social Determinants of Health   Financial Resource Strain: Not on file  Food Insecurity: Not on file  Transportation Needs: Not on file  Physical Activity: Not on file  Stress: Not on file  Social Connections: Not on file   Family History  Problem Relation Age of Onset  . Stroke Mother   . CAD Father   . Stomach cancer Sister   . Breast cancer Sister   . Breast cancer Sister   . Breast cancer Daughter    Scheduled Meds: . acetaminophen  650 mg Oral QPC lunch  . levothyroxine  75 mcg Oral Q0600  . multivitamin with minerals  1 tablet Oral Daily  . pantoprazole (PROTONIX) IV  40 mg Intravenous Q12H  . sodium bicarbonate  650 mg Oral BID   Continuous Infusions: . sodium chloride    . promethazine (PHENERGAN) injection Stopped (08/13/20 0010)   PRN Meds:.albuterol, ALPRAZolam, alum & mag hydroxide-simeth, HYDROcodone-acetaminophen,  ondansetron **OR** ondansetron (ZOFRAN) IV, promethazine (PHENERGAN) injection Medications Prior to Admission:  Prior to Admission medications   Medication Sig Start Date End Date Taking? Authorizing Provider  ALPRAZolam Duanne Moron) 0.25 MG tablet  11/15/14  Yes [provider]  aspirin EC 81 MG tablet Take 81 mg by mouth daily.    Yes [provider]  furosemide (LASIX) 20 MG tablet Take 20 mg by mouth. Every other day 04/24/20 08/10/20 Yes [provider]  HYDROcodone-acetaminophen (NORCO/VICODIN) 5-325 MG per tablet 1 tablet 3 (three) times daily. 11/07/14  Yes [provider]  levothyroxine (SYNTHROID) 75 MCG tablet TAKE 1 TABLET ON AN EMPTY STOMACH WITH A GLASS OF WATER AT LEAST 30 TO 11 MINUTES BEFORE BREAKFAST. 04/23/11  Yes [provider]  Multiple Vitamin (MULTI-VITAMINS) TABS Take 1 tablet by mouth daily.    Yes [provider]  acetaminophen (TYLENOL) 325 MG tablet Take 650 mg by mouth daily after lunch.    [provider]  amLODipine (NORVASC) 2.5 MG tablet  07/31/19 07/30/20  [provider]  Cetirizine HCl 10 MG CAPS Take 10 mg by mouth as needed.    [provider]  Cyanocobalamin 2500 MCG SUBL Place 1 tablet under the tongue daily.  Patient not taking: No sig reported    [provider]  ferrous sulfate 325 (65 FE) MG EC tablet Take 325 mg by mouth 2 (two) times daily.    [provider]  sodium bicarbonate 650 MG tablet Take 650 mg by mouth 2 (two) times daily. Patient not taking: No sig reported    [provider]   Allergies  Allergen Reactions  . Celecoxib Other (See Comments)    Decreased kidney function   . Clarithromycin Other (See Comments)    Loss of taste    Review of Systems  Gastrointestinal: Positive for blood in stool and diarrhea.    Physical Exam Pulmonary:     Effort: Pulmonary effort is normal.  Genitourinary:    Comments: Grossly bloody stool noted  during my visit.  Neurological:     Mental Status: She is alert.     Vital Signs: BP 127/62 (BP Location: Right Arm)   Pulse 60   Temp 98 F (36.7 C) (Oral)   Resp 17   Ht 5' (1.524 m)   Wt 46 kg   SpO2 98%   BMI 19.80 kg/m  Pain Scale: 0-10 POSS *See Group Information*: 1-Acceptable,Awake and alert Pain Score: 0-No pain  SpO2: SpO2: 98 % O2 Device:SpO2: 98 % O2 Flow Rate: .O2 Flow Rate (L/min): 4 L/min  IO: Intake/output summary:   Intake/Output Summary (Last 24 hours) at 08/13/2020 1222 Last data filed at 08/13/2020 1031 Gross per 24 hour  Intake 1610 ml  Output --  Net 1610 ml    LBM: Last BM Date: 08/13/20 Baseline Weight: Weight: 47.2 kg Most recent weight: Weight: 46 kg      Time In: 10:45 Time Out: 11:15 Time Total: 30 min Greater than 50%  of this time was spent counseling and coordinating care related to the above assessment and plan.  Signed by: Asencion Gowda, NP   Please contact Palliative Medicine Team phone at [phone] for questions and concerns.  For individual provider: See Shea Evans

## 2020-08-13 NOTE — Progress Notes (Signed)
PROGRESS NOTE    Lynn Rosario  KGU:[id number] DOB: 07/14/1928 DOA: 08/10/2020 PCP: Lynn Daisy, MD     Brief Narrative:  Lynn Rosario is an 87 year old female with PMHx of chronic kidney disease and chronic anemia and hypothyroidism who presented to the hospital with rectal bleeding.  Rectal exam revealed a rectal mass.  Patient received blood transfusion due to blood loss anemia.  Patient underwent flex sigmoidoscopy on 4/5, oncology consulted.  New events last 24 hours / Subjective: Patient sitting in bed eating breakfast, she has no physical complaints today.  Denies any lightheadedness or dizziness, chest pain or shortness of breath, no nausea, vomiting.  Daughter at bedside.  They are awaiting follow-up with oncology team to discuss next course of action.  Assessment & Plan:   Active Problems:   Lower GI bleed   Acute blood loss anemia   Vaginal bleeding   Acute kidney injury superimposed on CKD (HCC)   Essential hypertension   Other chronic pain   Hypothyroidism   Rectal mass   Liver metastases (HCC)   Acute blood loss anemia, GI blood loss -Status post 2 unit packed red blood cell transfusion -Status post IV iron -Hemoglobin stable currently  Rectal mass -Status post flex sigmoidoscopy 4/5 -Pathology pending -Oncology and palliative care medicine consulted  AKI on CKD stage IIIb -Baseline creatinine 1.27 -Improving  Hypothyroidism -Continue Synthroid   DVT prophylaxis:  SCDs Start: 08/10/20 1451  Code Status: DNR Family Communication: Daughter at bedside Disposition Plan:  Status is: Inpatient  Remains inpatient appropriate because:Inpatient level of care appropriate due to severity of illness   Dispo: The patient is from: Home              Anticipated d/c is to: Home              Patient currently is not medically stable to d/c.  Pending further recommendation and discussion with oncology, palliative care medicine   Difficult to place  patient No      Consultants:   GI  GYN  Oncology  Palliative care medicine   Antimicrobials:  Anti-infectives (From admission, onward)   None        Objective: Vitals:   08/12/20 2318 08/13/20 0401 08/13/20 0618 08/13/20 1100  BP: 130/70 (!) 117/96  127/62  Pulse: 70 66  60  Resp: 20 16  17   Temp: 98.4 F (36.9 C) 98.1 F (36.7 C)  98 F (36.7 C)  TempSrc:    Oral  SpO2: 97% 96%  98%  Weight:   46 kg   Height:        Intake/Output Summary (Last 24 hours) at 08/13/2020 1301 Last data filed at 08/13/2020 1031 Gross per 24 hour  Intake 1610 ml  Output --  Net 1610 ml   Filed Weights   08/11/20 0523 08/12/20 1542 08/13/20 0618  Weight: 46 kg 46 kg 46 kg    Examination:  General exam: Appears calm and comfortable  Respiratory system: Clear to auscultation. Respiratory effort normal. No respiratory distress. No conversational dyspnea.  Cardiovascular system: S1 & S2 heard, RRR. No murmurs. No pedal edema. Gastrointestinal system: Abdomen is nondistended, soft and nontender. Normal bowel sounds heard. Central nervous system: Alert and oriented. No focal neurological deficits. Speech clear.  Extremities: Symmetric in appearance  Skin: No rashes, lesions or ulcers on exposed skin  Psychiatry: Judgement and insight appear normal. Mood & affect appropriate.   Data Reviewed: I have personally reviewed following labs and imaging studies  CBC: Recent Labs  Lab 08/10/20 1019 08/10/20 1711 08/10/20 2014 08/10/20 2048 08/11/20 0559 08/11/20 1617 08/12/20 0508 08/13/20 0540  WBC 11.6*  --   --   --  8.3  --  13.3* 9.1  NEUTROABS 8.9*  --   --   --   --   --   --   --   HGB 6.9*   < > 8.3* 8.4* 8.3* 8.4* 7.9* 10.2*  HCT 22.7*   < > 25.9* 26.2* 25.9*  --  24.5* 31.6*  MCV 91.9  --   --   --  89.3  --  89.7 88.5  PLT 508*  --   --   --  418*  --  382 377   < > = values in this interval not displayed.   Basic Metabolic Panel: Recent Labs  Lab 08/10/20 1019  08/11/20 0559 08/13/20 0540  NA 136 135 133*  K 4.6 5.0 4.4  CL 102 104 103  CO2 24 25 24   GLUCOSE 111* 81 83  BUN 33* 29* 17  CREATININE 1.67* 1.40* 1.35*  CALCIUM 7.7* 7.3* 7.5*   GFR: Estimated Creatinine Clearance: 20.3 mL/min (A) (by C-G formula based on SCr of 1.35 mg/dL (H)). Liver Function Tests: Recent Labs  Lab 08/10/20 1019 08/11/20 0559  AST 29 26  ALT 14 13  ALKPHOS 101 84  BILITOT 0.4 0.9  PROT 6.1* 5.2*  ALBUMIN 3.1* 2.6*   No results for input(s): LIPASE, AMYLASE in the last 168 hours. No results for input(s): AMMONIA in the last 168 hours. Coagulation Profile: Recent Labs  Lab 08/10/20 1019  INR 1.1   Cardiac Enzymes: No results for input(s): CKTOTAL, CKMB, CKMBINDEX, TROPONINI in the last 168 hours. BNP (last 3 results) No results for input(s): PROBNP in the last 8760 hours. HbA1C: No results for input(s): HGBA1C in the last 72 hours. CBG: No results for input(s): GLUCAP in the last 168 hours. Lipid Profile: No results for input(s): CHOL, HDL, LDLCALC, TRIG, CHOLHDL, LDLDIRECT in the last 72 hours. Thyroid Function Tests: No results for input(s): TSH, T4TOTAL, FREET4, T3FREE, THYROIDAB in the last 72 hours. Anemia Panel: No results for input(s): VITAMINB12, FOLATE, FERRITIN, TIBC, IRON, RETICCTPCT in the last 72 hours. Sepsis Labs: No results for input(s): PROCALCITON, LATICACIDVEN in the last 168 hours.  Recent Results (from the past 240 hour(s))  Resp Panel by RT-PCR (Flu A&B, Covid) Nasopharyngeal Swab     Status: None   Collection Time: 08/10/20 11:24 AM   Specimen: Nasopharyngeal Swab; Nasopharyngeal(NP) swabs in vial transport medium  Result Value Ref Range Status   SARS Coronavirus 2 by RT PCR NEGATIVE NEGATIVE Final    Comment: (NOTE) SARS-CoV-2 target nucleic acids are NOT DETECTED.  The SARS-CoV-2 RNA is generally detectable in upper respiratory specimens during the acute phase of infection. The lowest concentration of  SARS-CoV-2 viral copies this assay can detect is 138 copies/mL. A negative result does not preclude SARS-Cov-2 infection and should not be used as the sole basis for treatment or other patient management decisions. A negative result may occur with  improper specimen collection/handling, submission of specimen other than nasopharyngeal swab, presence of viral mutation(s) within the areas targeted by this assay, and inadequate number of viral copies(<138 copies/mL). A negative result must be combined with clinical observations, patient history, and epidemiological information. The expected result is Negative.  Fact Sheet for Patients:  EntrepreneurPulse.com.au  Fact Sheet for Healthcare Providers:  IncredibleEmployment.be  This test is no  t yet approved or cleared by the [place] and  has been authorized for detection and/or diagnosis of SARS-CoV-2 by FDA under an Emergency Use Authorization (EUA). This EUA will remain  in effect (meaning this test can be used) for the duration of the COVID-19 declaration under Section 564(b)(1) of the Act, 21 U.S.C.section 360bbb-3(b)(1), unless the authorization is terminated  or revoked sooner.       Influenza A by PCR NEGATIVE NEGATIVE Final   Influenza B by PCR NEGATIVE NEGATIVE Final    Comment: (NOTE) The Xpert Xpress SARS-CoV-2/FLU/RSV plus assay is intended as an aid in the diagnosis of influenza from Nasopharyngeal swab specimens and should not be used as a sole basis for treatment. Nasal washings and aspirates are unacceptable for Xpert Xpress SARS-CoV-2/FLU/RSV testing.  Fact Sheet for Patients: EntrepreneurPulse.com.au  Fact Sheet for Healthcare Providers: IncredibleEmployment.be  This test is not yet approved or cleared by the [place] FDA and has been authorized for detection and/or diagnosis of SARS-CoV-2 by FDA under an Emergency Use  Authorization (EUA). This EUA will remain in effect (meaning this test can be used) for the duration of the COVID-19 declaration under Section 564(b)(1) of the Act, 21 U.S.C. section 360bbb-3(b)(1), unless the authorization is terminated or revoked.  Performed at [facility], [address] [place], [place] [address]   Urine culture     Status: Abnormal   Collection Time: 08/10/20  2:53 PM   Specimen: Urine, Random  Result Value Ref Range Status   Specimen Description   Final    URINE, RANDOM Performed at [facility], [address], [place], [place] [address]    Special Requests   Final    NONE Performed at [facility], [address] [place], [place] [address]    Culture MULTIPLE SPECIES PRESENT, SUGGEST RECOLLECTION (A)  Final   Report Status 08/12/2020 FINAL  Final      Radiology Studies: CT ABDOMEN PELVIS WO CONTRAST  Result Date: 08/11/2020 CLINICAL DATA:  Rectal bleeding.  Possible vaginal bleeding. EXAM: CT ABDOMEN AND PELVIS WITHOUT CONTRAST TECHNIQUE: Multidetector CT imaging of the abdomen and pelvis was performed following the standard protocol without IV contrast. COMPARISON:  None. FINDINGS: Lower chest: There is compressive atelectasis at the lung bases.Aortic calcifications are noted. The heart size is normal. The intracardiac blood pool is hypodense relative to the adjacent myocardium consistent with anemia. There are small bilateral pleural effusions. Hepatobiliary: There are multiple indeterminate liver masses. The largest in the left hepatic lobe measures approximately 2.3 cm (axial series 2, image 16). The largest in the right hepatic lobe measures approximately 3.3 cm (axial series 2, image 22). Normal gallbladder.There is no biliary ductal dilation. Pancreas: There is questionable fullness the level of the head of the pancreas, poorly evaluated secondary to motion artifact and lack of IV contrast. Spleen: Unremarkable.  Adrenals/Urinary Tract: --Adrenal glands: Unremarkable. --Right kidney/ureter: No hydronephrosis or radiopaque kidney stones. --Left kidney/ureter: No hydronephrosis or radiopaque kidney stones. --Urinary bladder: Unremarkable. Stomach/Bowel: --Stomach/Duodenum: The majority of the stomach is herniated into the thoracic cavity. --Small bowel: Unremarkable. --Colon: There is a large amount of stool in the colon. There is a possible mass at the level of the rectum. At this level, there is a recto-rectal intussusception without evidence for an obstruction. There is scattered colonic diverticula without CT evidence for diverticulitis. Portions of the transverse colon are herniated into the thoracic cavity. --Appendix: Normal. Vascular/Lymphatic: Atherosclerotic calcification is present within the non-aneurysmal abdominal  aorta, without hemodynamically significant stenosis. --No retroperitoneal lymphadenopathy. --No mesenteric lymphadenopathy. --there are enlarged perirectal lymph nodes (axial series 2, image 56). Reproductive: Status post hysterectomy. No adnexal mass. There is pelvic floor laxity with a probable rectocele. Other: No ascites or free air. The abdominal wall is normal. Musculoskeletal. There is a significant degenerative levoscoliosis of the lumbar spine with advanced degenerative changes throughout the lumbar spine. There are degenerative changes of both hips. There is no acute fracture. There is an area of sclerosis involving the anterior right iliac bone (axial series 2, image 41). IMPRESSION: 1. Exam significantly limited by motion artifact and lack of IV contrast. 2. Findings concerning for metastatic colorectal carcinoma. There are multiple hypoattenuating liver masses as detailed above in addition to a possible rectal mass with adjacent enlarged perirectal lymph nodes. This rectal mass appears to cause a recto-rectal intussusception without evidence for obstruction. A repeat contrast enhanced CT  would be useful for further evaluation. 3. Large hiatal hernia containing the majority of the stomach and portions of the transverse colon. 4. Ill-defined prominence at the level of the head of the pancreas. A follow-up contrast enhanced CT would be useful for further evaluation of this area. 5. Small bilateral pleural effusions. 6.  Aortic Atherosclerosis (ICD10-I70.0). Electronically Signed   By: Constance Holster M.D.   On: 08/11/2020 22:20      Scheduled Meds: . acetaminophen  650 mg Oral QPC lunch  . levothyroxine  75 mcg Oral Q0600  . multivitamin with minerals  1 tablet Oral Daily  . pantoprazole (PROTONIX) IV  40 mg Intravenous Q12H  . sodium bicarbonate  650 mg Oral BID   Continuous Infusions: . sodium chloride    . promethazine (PHENERGAN) injection Stopped (08/13/20 0010)     LOS: 3 days      Time spent: 30 minutes   Dessa Phi, DO [facility] 08/13/2020, 1:01 PM   Available via Epic secure chat 7am-7pm After these hours, please refer to coverage provider listed on [email or web address]

## 2020-08-13 NOTE — Evaluation (Signed)
Physical Therapy Evaluation Patient Details Name: Lynn Rosario MRN: [id number] DOB: Nov 12, 1928 Today's Date: 08/13/2020   History of Present Illness  Pt is an 87 y.o. female presenting to ED 4/3 with intermittent weakness and blood clots noted in depends; also diarrhea x2 weeks.  Hgb noted to be 6.9 and s/p pRBC transfusion.  Pt admitted with acute lower GI bleed with symptomatic anemia and AKI on CKD III.  S/p flexible sigmoidoscopy 4/5 and s/p another blood transfusion.  PMH includes hypothyroidism, htn, anemia, chronic LBP, CKD3, lumbar radiculitis, lumbar stenosis with neurogenic claudication, insomnia, and L TKA.  Clinical Impression  Prior to hospital admission, pt was modified independent ambulating with [facility]; recently living with her daughter d/t increased assist needs.  Therapist attempted to encourage pt to perform OOB mobility but pt only agreeable to LE ex's in bed and logrolling L/R in bed (pt even declined sitting edge of bed).  D/t extended time in bed during hospitalization and subsequent deconditioning and weakness, anticipate pt would benefit from SNF but will continue to monitor pt's status and levels of participation with therapy during hospital stay.  Pt would benefit from skilled PT during hospital stay to focus on strengthening and progressive functional mobility per pt tolerance.    Follow Up Recommendations SNF (pending further assessment and pt's participation with therapy)    Equipment Recommendations  Other (comment) (TBD)    Recommendations for Other Services       Precautions / Restrictions Precautions Precautions: Fall Restrictions Weight Bearing Restrictions: No      Mobility  Bed Mobility Overal bed mobility: Needs Assistance Bed Mobility: Rolling Rolling: Modified independent (Device/Increase time)         General bed mobility comments: logrolling L and R in bed with use of bed rail    Transfers                 General transfer  comment: pt declined OOB mobility  Ambulation/Gait             General Gait Details: pt declined OOB mobility  Stairs            Wheelchair Mobility    Modified Rankin (Stroke Patients Only)       Balance                                             Pertinent Vitals/Pain Pain Assessment: No/denies pain Pain Intervention(s): Limited activity within patient's tolerance;Monitored during session;Repositioned  Vitals (HR and O2 on room air) stable and WFL throughout treatment session.    Home Living Family/patient expects to be discharged to:: Private residence Living Arrangements: Children Available Help at Discharge: Family Type of Home: House Home Access: Stairs to enter Entrance Stairs-Rails: None Entrance Stairs-Number of Steps: 1 step plus 1 small step to enter Home Layout: One level Home Equipment: [place] - single point;Walker - 4 wheels Additional Comments: Was living alone until  about 3 weeks ago (moved into her daughter's home)    Prior Function Level of Independence: Independent with assistive device(s)         Comments: Modified independent ambulating with SPC.     Hand Dominance        Extremity/Trunk Assessment   Upper Extremity Assessment Upper Extremity Assessment: Generalized weakness    Lower Extremity Assessment Lower Extremity Assessment: Generalized weakness    Cervical / Trunk Assessment  Cervical / Trunk Assessment: Kyphotic (h/o scoliosis)  Communication   Communication: HOH  Cognition Arousal/Alertness: Awake/alert Behavior During Therapy: Flat affect Overall Cognitive Status: Within Functional Limits for tasks assessed                                        General Comments   Nursing cleared pt for participation in physical therapy.  Pt agreeable to limited PT session.  Pt's daughter present during session.    Exercises Total Joint Exercises Ankle Circles/Pumps:  AROM;Strengthening;Both;10 reps;Supine Quad Sets: AROM;Strengthening;Both;10 reps;Supine Short Arc Quad: AROM;Strengthening;Both;10 reps;Supine Heel Slides: AROM;Strengthening;Both;10 reps;Supine Hip ABduction/ADduction: AAROM;Strengthening;Both;10 reps;Supine   Assessment/Plan    PT Assessment Patient needs continued PT services  PT Problem List Decreased strength;Decreased activity tolerance;Decreased balance;Decreased mobility;Decreased knowledge of use of DME;Decreased knowledge of precautions       PT Treatment Interventions DME instruction;Gait training;Stair training;Functional mobility training;Therapeutic activities;Therapeutic exercise;Balance training;Patient/family education    PT Goals (Current goals can be found in the Care Plan section)  Acute Rehab PT Goals Patient Stated Goal: to improve strength PT Goal Formulation: With patient Time For Goal Achievement: 08/27/20 Potential to Achieve Goals: Fair    Frequency Min 2X/week   Barriers to discharge Decreased caregiver support      Co-evaluation               AM-PAC PT "6 Clicks" Mobility  Outcome Measure Help needed turning from your back to your side while in a flat bed without using bedrails?: None Help needed moving from lying on your back to sitting on the side of a flat bed without using bedrails?: A Little Help needed moving to and from a bed to a chair (including a wheelchair)?: A Lot Help needed standing up from a chair using your arms (e.g., wheelchair or bedside chair)?: A Lot Help needed to walk in hospital room?: Total Help needed climbing 3-5 steps with a railing? : Total 6 Click Score: 13    End of Session   Activity Tolerance: Patient tolerated treatment well Patient left: in bed;with call bell/phone within reach;with bed alarm set;with family/visitor present Nurse Communication: Precautions PT Visit Diagnosis: Other abnormalities of gait and mobility (R26.89);Muscle weakness  (generalized) (M62.81);Difficulty in walking, not elsewhere classified (R26.2)    Time: 1209-1227 PT Time Calculation (min) (ACUTE ONLY): 18 min   Charges:   PT Evaluation $PT Eval Low Complexity: 1 Low PT Treatments $Therapeutic Exercise: 8-22 mins       Leitha Bleak, PT 08/13/20, 2:30 PM

## 2020-08-13 NOTE — Plan of Care (Signed)

## 2020-08-14 ENCOUNTER — Inpatient Hospital Stay: Payer: Medicare Other

## 2020-08-14 LAB — CBC
HCT: 33 % — ABNORMAL LOW (ref 36.0–46.0)
Hemoglobin: 10.3 g/dL — ABNORMAL LOW (ref 12.0–15.0)
MCH: 28.4 pg (ref 26.0–34.0)
MCHC: 31.2 g/dL (ref 30.0–36.0)
MCV: 90.9 fL (ref 80.0–100.0)
Platelets: 410 10*3/uL — ABNORMAL HIGH (ref 150–400)
RBC: 3.63 MIL/uL — ABNORMAL LOW (ref 3.87–5.11)
RDW: 18.1 % — ABNORMAL HIGH (ref 11.5–15.5)
WBC: 11.3 10*3/uL — ABNORMAL HIGH (ref 4.0–10.5)
nRBC: 0 % (ref 0.0–0.2)

## 2020-08-14 LAB — BASIC METABOLIC PANEL
Anion gap: 7 (ref 5–15)
BUN: 17 mg/dL (ref 8–23)
CO2: 24 mmol/L (ref 22–32)
Calcium: 7.5 mg/dL — ABNORMAL LOW (ref 8.9–10.3)
Chloride: 101 mmol/L (ref 98–111)
Creatinine, Ser: 1.21 mg/dL — ABNORMAL HIGH (ref 0.44–1.00)
GFR, Estimated: 43 mL/min — ABNORMAL LOW (ref >60)
Glucose, Bld: 93 mg/dL (ref 70–99)
Potassium: 4.8 mmol/L (ref 3.5–5.1)
Sodium: 132 mmol/L — ABNORMAL LOW (ref 135–145)

## 2020-08-14 LAB — CEA: CEA: 7.8 ng/mL — ABNORMAL HIGH (ref 0.0–4.7)

## 2020-08-14 NOTE — Consult Note (Signed)
Subjective:   CC: Rectal mass  HPI:  Lynn Rosario is a 87 y.o. female who was consulted by Maylene Roes for evaluation of above.  Admitted for rectal bleeding.  DRE and CT scan noted concern for a rectal mass.  Subsequent sigmoidoscopy confirmed a almost obstructing rectal mass within 2 to 10 cm of the anal verge.  Biopsies pending.  Surgery consulted to discuss possible surgical options.   Past Medical History:  has a past medical history of Anemia, Hypertension, Hypothyroidism, Insomnia, Lumbar radiculitis, Lumbar spondylosis, Lumbar stenosis with neurogenic claudication, and Stage III chronic kidney disease ([place]).  Past Surgical History:  has a past surgical history that includes Appendectomy; Abdominal hysterectomy; Total knee arthroplasty (Left, 2002); and Flexible sigmoidoscopy (N/A, 08/12/2020).  Family History: family history includes Breast cancer in her daughter, sister, and sister; CAD in her father; Stomach cancer in her sister; Stroke in her mother.  Social History:  reports that she has never smoked. She has never used smokeless tobacco. She reports that she does not drink alcohol and does not use drugs.  Current Medications:  Prior to Admission medications   Medication Sig Start Date End Date Taking? Authorizing Provider  ALPRAZolam Duanne Moron) 0.25 MG tablet  11/15/14  Yes [provider]  aspirin EC 81 MG tablet Take 81 mg by mouth daily.    Yes [provider]  furosemide (LASIX) 20 MG tablet Take 20 mg by mouth. Every other day 04/24/20 08/10/20 Yes [provider]  HYDROcodone-acetaminophen (NORCO/VICODIN) 5-325 MG per tablet 1 tablet 3 (three) times daily. 11/07/14  Yes [provider]  levothyroxine (SYNTHROID) 75 MCG tablet TAKE 1 TABLET ON AN EMPTY STOMACH WITH A GLASS OF WATER AT LEAST 30 TO 36 MINUTES BEFORE BREAKFAST. 04/23/11  Yes [provider]  Multiple Vitamin (MULTI-VITAMINS) TABS Take 1 tablet by mouth daily.    Yes [provider]  acetaminophen (TYLENOL) 325 MG tablet Take 650 mg by mouth daily after lunch.    [provider]  amLODipine (NORVASC) 2.5 MG tablet  07/31/19 07/30/20  [provider]  Cetirizine HCl 10 MG CAPS Take 10 mg by mouth as needed.    [provider]  Cyanocobalamin 2500 MCG SUBL Place 1 tablet under the tongue daily.  Patient not taking: No sig reported    [provider]  ferrous sulfate 325 (65 FE) MG EC tablet Take 325 mg by mouth 2 (two) times daily.    [provider]  sodium bicarbonate 650 MG tablet Take 650 mg by mouth 2 (two) times daily. Patient not taking: No sig reported    [provider]    Allergies:  Allergies  Allergen Reactions  . Celecoxib Other (See Comments)    Decreased kidney function   . Clarithromycin Other (See Comments)    Loss of taste     ROS:  General: Denies weight loss, weight gain, fatigue, fevers, chills, and night sweats. Eyes: Denies blurry vision, double vision, eye pain, itchy eyes, and tearing. Ears: Denies hearing loss, earache, and ringing in ears. Nose: Denies sinus pain, congestion, infections, runny nose, and nosebleeds. Mouth/throat: Denies hoarseness, sore throat, bleeding gums, and difficulty swallowing. Heart: Denies chest pain, palpitations, racing heart, irregular heartbeat, leg pain or swelling, and decreased activity tolerance. Respiratory: Denies breathing difficulty, shortness of breath, wheezing, cough, and sputum. GI: Denies change in appetite, heartburn, nausea, vomiting, constipation, diarrhea, and blood in stool. GU: Denies difficulty urinating, pain with urinating, urgency, frequency, blood in urine. Musculoskeletal:  Denies joint stiffness, pain, swelling, muscle weakness. Skin: Denies rash, itching, mass, tumors, sores, and boils Neurologic: Denies headache, fainting, dizziness, seizures, numbness, and tingling. Psychiatric: Denies depression, anxiety,  difficulty sleeping, and memory loss. Endocrine: Denies heat or cold intolerance, and increased thirst or urination. Blood/lymph: Denies easy bruising, easy bruising, and swollen glands     Objective:     BP (!) 122/53 (BP Location: Right Arm)   Pulse 74   Temp 98.5 F (36.9 C) (Oral)   Resp 16   Ht 5' (1.524 m)   Wt 46 kg   SpO2 98%   BMI 19.80 kg/m   Constitutional :  alert, cooperative, appears stated age and no distress  Lymphatics/Throat:  no asymmetry, masses, or scars  Respiratory:  clear to auscultation bilaterally  Cardiovascular:  regular rate and rhythm  Gastrointestinal: soft, non-tender; bowel sounds normal; no masses,  no organomegaly.  Musculoskeletal: Steady gait and movement  Skin: Cool and moist,   Psychiatric: Normal affect, non-agitated, not confused  Rectal:  Deferred    LABS:  CMP Latest Ref Rng & Units 08/14/2020 08/13/2020 08/11/2020  Glucose 70 - 99 mg/dL 93 83 81  BUN 8 - 23 mg/dL 17 17 29(H)  Creatinine 0.44 - 1.00 mg/dL 1.21(H) 1.35(H) 1.40(H)  Sodium 135 - 145 mmol/L 132(L) 133(L) 135  Potassium 3.5 - 5.1 mmol/L 4.8 4.4 5.0  Chloride 98 - 111 mmol/L 101 103 104  CO2 22 - 32 mmol/L 24 24 25   Calcium 8.9 - 10.3 mg/dL 7.5(L) 7.5(L) 7.3(L)  Total Protein 6.5 - 8.1 g/dL - - 5.2(L)  Total Bilirubin 0.3 - 1.2 mg/dL - - 0.9  Alkaline Phos 38 - 126 U/L - - 84  AST 15 - 41 U/L - - 26  ALT 0 - 44 U/L - - 13   CBC Latest Ref Rng & Units 08/14/2020 08/13/2020 08/12/2020  WBC 4.0 - 10.5 K/uL 11.3(H) 9.1 13.3(H)  Hemoglobin 12.0 - 15.0 g/dL 10.3(L) 10.2(L) 7.9(L)  Hematocrit 36.0 - 46.0 % 33.0(L) 31.6(L) 24.5(L)  Platelets 150 - 400 K/uL 410(H) 377 382    RADS: CLINICAL DATA:  Rectal bleeding.  Possible vaginal bleeding.  EXAM: CT ABDOMEN AND PELVIS WITHOUT CONTRAST  TECHNIQUE: Multidetector CT imaging of the abdomen and pelvis was performed following the standard protocol without IV contrast.  COMPARISON:  None.  FINDINGS: Lower chest: There is  compressive atelectasis at the lung bases.Aortic calcifications are noted. The heart size is normal. The intracardiac blood pool is hypodense relative to the adjacent myocardium consistent with anemia. There are small bilateral pleural effusions.  Hepatobiliary: There are multiple indeterminate liver masses. The largest in the left hepatic lobe measures approximately 2.3 cm (axial series 2, image 16). The largest in the right hepatic lobe measures approximately 3.3 cm (axial series 2, image 22). Normal gallbladder.There is no biliary ductal dilation.  Pancreas: There is questionable fullness the level of the head of the pancreas, poorly evaluated secondary to motion artifact and lack of IV contrast.  Spleen: Unremarkable.  Adrenals/Urinary Tract:  --Adrenal glands: Unremarkable.  --Right kidney/ureter: No hydronephrosis or radiopaque kidney stones.  --Left kidney/ureter: No hydronephrosis or radiopaque kidney stones.  --Urinary bladder: Unremarkable.  Stomach/Bowel:  --Stomach/Duodenum: The majority of the stomach is herniated into the thoracic cavity.  --Small bowel: Unremarkable.  --Colon: There is a large amount of stool in the colon. There is a possible mass at the level of the rectum. At this level, there is a recto-rectal intussusception without evidence for  an obstruction. There is scattered colonic diverticula without CT evidence for diverticulitis. Portions of the transverse colon are herniated into the thoracic cavity.  --Appendix: Normal.  Vascular/Lymphatic: Atherosclerotic calcification is present within the non-aneurysmal abdominal aorta, without hemodynamically significant stenosis.  --No retroperitoneal lymphadenopathy.  --No mesenteric lymphadenopathy.  --there are enlarged perirectal lymph nodes (axial series 2, image 56).  Reproductive: Status post hysterectomy. No adnexal mass. There is pelvic floor laxity with a probable  rectocele.  Other: No ascites or free air. The abdominal wall is normal.  Musculoskeletal. There is a significant degenerative levoscoliosis of the lumbar spine with advanced degenerative changes throughout the lumbar spine. There are degenerative changes of both hips. There is no acute fracture. There is an area of sclerosis involving the anterior right iliac bone (axial series 2, image 41).  IMPRESSION: 1. Exam significantly limited by motion artifact and lack of IV contrast. 2. Findings concerning for metastatic colorectal carcinoma. There are multiple hypoattenuating liver masses as detailed above in addition to a possible rectal mass with adjacent enlarged perirectal lymph nodes. This rectal mass appears to cause a recto-rectal intussusception without evidence for obstruction. A repeat contrast enhanced CT would be useful for further evaluation. 3. Large hiatal hernia containing the majority of the stomach and portions of the transverse colon. 4. Ill-defined prominence at the level of the head of the pancreas. A follow-up contrast enhanced CT would be useful for further evaluation of this area. 5. Small bilateral pleural effusions. 6.  Aortic Atherosclerosis (ICD10-I70.0).   Electronically Signed   By: Constance Holster M.D.   On: 08/11/2020 22:20    CLINICAL DATA:  History of rectal mass.  EXAM: CT CHEST WITHOUT CONTRAST  TECHNIQUE: Multidetector CT imaging of the chest was performed following the standard protocol without IV contrast.  COMPARISON:  August 11, 2020.  FINDINGS: Cardiovascular: Atherosclerosis of thoracic aorta is noted without aneurysm formation. Normal cardiac size. No pericardial effusion. Extensive coronary artery calcifications are noted.  Mediastinum/Nodes: Large sliding-type hiatal hernia is noted. There also appears to be a large portion of transverse colon within the hiatal hernia as well. Thyroid gland is unremarkable. No  definite adenopathy is noted.  Lungs/Pleura: No pneumothorax is noted. Mild bibasilar subsegmental atelectasis is noted. Small bilateral pleural effusions are noted. Right apical scarring is noted. 3 mm nodule is noted in right middle lobe best seen on image number 83 of series 3.  Upper Abdomen: Multiple hepatic low densities are noted consistent with metastatic disease as noted on prior study.  Musculoskeletal: No definite acute fracture is noted.  IMPRESSION: Large sliding-type hiatal hernia. Large portion of transverse colon also appears to be within the hiatal hernia as well.  3 mm nodule is noted in right middle lobe. While this most likely is benign, metastatic disease cannot be excluded given the history of metastatic rectal cancer. Attention to this abnormality on follow-up imaging is recommended.  Mild bibasilar subsegmental atelectasis is noted with small pleural effusions.  Extensive coronary artery calcifications are noted.  Aortic Atherosclerosis (ICD10-I70.0).   Electronically Signed   By: Marijo Conception M.D.   On: 08/14/2020 14:08   Assessment:     Rectal mass, biopsy results pending but sigmoidoscopy report as well as CT findings concerning for adenocarcinoma with distant metastasis   Plan:     Due to the multiple liver lesions and questionable lung nodule that likely represents metastatic lesions, patient is not a candidate for upfront surgical resection as a curative procedure.  If  patient and family wishes to continue with treatment, agree with oncology recs of proceeding with chemotherapy initially.  I explained to family and patient that I will be able to place a port for chemotherapy infusion if that is the route they wish to take.  I also explained to them the role of palliative diverting ostomy in her case.  This will allow her to avoid experiencing obstructive symptoms that may further decline her current quality of life.  We briefly  discussed the surgical procedure and what to expect after having a ostomy created.  After discussing the above options, patient and family were appreciative of the consultation.  CT chest was pending at the time of consultation.  They wish to obtain all information prior to making a final decision regarding how to move forward.  Surgery service will be available as needed.  If patient and family members have further questions about possible resection upfront, consultation with a colorectal surgeon or a tertiary care center surgeon that has further experience in these advanced rectal cancer cases is recommended.  Further discussion of goals of care as well as additional treatment options per oncology and palliative care team.

## 2020-08-14 NOTE — Progress Notes (Signed)
Daily Progress Note   Patient Name: Lynn Rosario       Date: 08/14/2020 DOB: May 22, 1928  Age: 87 y.o. MRN#: [id number] Attending Physician: Dessa Phi, DO Primary Care Physician: Sherrin Daisy, MD Admit Date: 08/10/2020  Reason for Consultation/Follow-up: Establishing goals of care  Subjective: Patient is resting in bed with eyes closed. Daughter is at bedside. We discussed her mother's status. She states she has been talking with family and are waiting to see what the various teams have to say. They are waiting for a chest CT, and to speak with surgery service as well as oncology when path results return. Discussed quantity vs quality and acceptable QOL for her.  Will follow.     Length of Stay: 4  Current Medications: Scheduled Meds:  . acetaminophen  650 mg Oral QPC lunch  . levothyroxine  75 mcg Oral Q0600  . multivitamin with minerals  1 tablet Oral Daily  . pantoprazole (PROTONIX) IV  40 mg Intravenous Q12H  . sodium bicarbonate  650 mg Oral BID    Continuous Infusions: . sodium chloride    . promethazine (PHENERGAN) injection Stopped (08/13/20 0010)    PRN Meds: albuterol, ALPRAZolam, alum & mag hydroxide-simeth, HYDROcodone-acetaminophen, ondansetron **OR** ondansetron (ZOFRAN) IV, promethazine (PHENERGAN) injection  Physical Exam Constitutional:      Comments: Eyes closed and resting.              Vital Signs: BP (!) 155/64 (BP Location: Left Arm)   Pulse 66   Temp 98.3 F (36.8 C)   Resp 17   Ht 5' (1.524 m)   Wt 46 kg   SpO2 98%   BMI 19.80 kg/m  SpO2: SpO2: 98 % O2 Device: O2 Device: Room Air O2 Flow Rate: O2 Flow Rate (L/min): 4 L/min  Intake/output summary:   Intake/Output Summary (Last 24 hours) at 08/14/2020 1146 Last data filed at 08/14/2020  1026 Gross per 24 hour  Intake 240 ml  Output --  Net 240 ml   LBM: Last BM Date: 08/14/20 Baseline Weight: Weight: 47.2 kg Most recent weight: Weight: 46 kg       Palliative Assessment/Data:      Patient Active Problem List   Diagnosis Date Noted  . Rectal mass   . Liver metastases ([place])   . Acute blood loss anemia   .  Vaginal bleeding   . Acute kidney injury superimposed on CKD ([place])   . Essential hypertension   . Other chronic pain   . Hypothyroidism   . Lower GI bleed 08/10/2020  . Iron deficiency anemia 05/12/2020  . Anemia due to stage 3 chronic kidney disease ([place]) 08/24/2016  . Anemia in chronic kidney disease 02/24/2015    Palliative Care Assessment & Plan    Recommendations/Plan:    Plans for a GOC conversation once information is gathered.  Daughter is waiting to speak with surgery service.  Daughter is waiting for chest CT results.  She is waiting for path results to return to speak with oncology about options and recs.    Code Status:    Code Status Orders  (From admission, onward)         Start     Ordered   08/10/20 1556  Do not attempt resuscitation (DNR)  Continuous       Question Answer Comment  In the event of cardiac or respiratory ARREST Do not call a "code blue"   In the event of cardiac or respiratory ARREST Do not perform Intubation, CPR, defibrillation or ACLS   In the event of cardiac or respiratory ARREST Use medication by any route, position, wound care, and other measures to relive pain and suffering. May use oxygen, suction and manual treatment of airway obstruction as needed for comfort.      08/10/20 1556        Code Status History    Date Active Date Inactive Code Status Order ID Comments User Context   08/10/2020 1453 08/10/2020 1556 Full Code [id number]  Clance Boll, MD ED   Advance Care Planning Activity    Advance Directive Documentation   Flowsheet Row Most Recent Value  Type of Advance Directive  Healthcare Power of Attorney, Living will  Pre-existing out of facility DNR order (yellow form or pink MOST form) --  "MOST" Form in Place? --       Prognosis:  Poor   Thank you for allowing the Palliative Medicine Team to assist in the care of this patient.   Total Time 25 min Prolonged Time Billed  no      Greater than 50%  of this time was spent counseling and coordinating care related to the above assessment and plan.  Asencion Gowda, NP  Please contact Palliative Medicine Team phone at [phone] for questions and concerns.

## 2020-08-14 NOTE — Progress Notes (Signed)
[facility] Hematology/Oncology Progress Note  Date of admission: 08/10/2020  Hospital day:  08/14/2020  Chief Complaint: Lynn Rosario is a 87 y.o. female with iron deficiency anemia who was admitted through the emergency room with rectal bleeding.  Subjective: Patient denies any pain, nausea or vomiting.  Recurrent rectal bleeding.  Social History: The patient is accompanied by Lynn Rosario today.  Allergies:  Allergies  Allergen Reactions  . Celecoxib Other (See Comments)    Decreased kidney function   . Clarithromycin Other (See Comments)    Loss of taste     Scheduled Medications: . acetaminophen  650 mg Oral QPC lunch  . levothyroxine  75 mcg Oral Q0600  . multivitamin with minerals  1 tablet Oral Daily  . pantoprazole (PROTONIX) IV  40 mg Intravenous Q12H    Review of Systems  Constitutional: Positive for malaise/fatigue and weight loss. Negative for chills and fever.  HENT: Positive for hearing loss. Negative for congestion, ear pain, nosebleeds, sinus pain and sore throat.   Eyes: Negative.  Negative for blurred vision, double vision and pain.  Respiratory: Positive for shortness of breath (on exertion). Negative for cough and sputum production.   Cardiovascular: Negative for chest pain, palpitations, orthopnea and leg swelling.  Gastrointestinal: Positive for blood in stool and diarrhea. Negative for abdominal pain, constipation, heartburn, melena, nausea and vomiting.       Recurrent blood in stool.  Genitourinary: Negative.  Negative for dysuria, frequency, hematuria and urgency.  Musculoskeletal: Positive for back pain and joint pain (knee pain).  Skin: Negative for itching and rash.  Neurological: Positive for weakness (general). Negative for dizziness, tingling, tremors, sensory change, speech change, focal weakness and headaches.  Endo/Heme/Allergies: Negative.  Does not bruise/bleed easily.  Psychiatric/Behavioral: Negative for depression  and memory loss. The patient is not nervous/anxious and does not have insomnia.   All other systems reviewed and are negative.   Vitals: Blood pressure 140/69, pulse 80, temperature 98.2 F (36.8 C), resp. rate 20, height 5' (1.524 m), weight 101 lb 6.4 oz (46 kg), SpO2 100 %.   Physical Exam Vitals and nursing note reviewed.  Constitutional:      General: Lynn Rosario is not in acute distress.    Appearance: Normal appearance. Lynn Rosario is not ill-appearing.     Comments: Thin elderly woman sitting up in bed and eating a chocolate chip cookie.  HENT:     Head: Normocephalic and atraumatic.     Mouth/Throat:     Mouth: Mucous membranes are moist.     Pharynx: Oropharynx is clear. No oropharyngeal exudate or posterior oropharyngeal erythema.  Eyes:     General: No scleral icterus.    Conjunctiva/sclera: Conjunctivae normal.     Pupils: Pupils are equal, round, and reactive to light.  Cardiovascular:     Rate and Rhythm: Normal rate.  Pulmonary:     Effort: Pulmonary effort is normal.     Breath sounds: Normal breath sounds. No wheezing, rhonchi or rales.  Abdominal:     General: Abdomen is flat. There is no distension.     Palpations: Abdomen is soft. There is no mass.     Tenderness: There is no guarding or rebound.  Musculoskeletal:        General: No swelling.     Cervical back: Normal range of motion and neck supple.  Skin:    General: Skin is dry.     Coloration: Skin is not jaundiced or pale.     Findings:  No bruising, erythema or rash.  Neurological:     Mental Status: Lynn Rosario is alert. Mental status is at baseline.     Comments: Poor short term memory.  Psychiatric:        Mood and Affect: Mood normal.        Behavior: Behavior normal.        Thought Content: Thought content normal.        Judgment: Judgment normal.     Results for orders placed or performed during the hospital encounter of 08/10/20 (from the past 48 hour(s))  CBC     Status: Abnormal   Collection Time: 08/13/20   5:40 AM  Result Value Ref Range   WBC 9.1 4.0 - 10.5 K/uL   RBC 3.57 (L) 3.87 - 5.11 MIL/uL   Hemoglobin 10.2 (L) 12.0 - 15.0 g/dL    Comment: REPEATED TO VERIFY   HCT 31.6 (L) 36.0 - 46.0 %   MCV 88.5 80.0 - 100.0 fL   MCH 28.6 26.0 - 34.0 pg   MCHC 32.3 30.0 - 36.0 g/dL   RDW 18.3 (H) 11.5 - 15.5 %   Platelets 377 150 - 400 K/uL   nRBC 0.0 0.0 - 0.2 %    Comment: Performed at [facility], [address] [place], [place] [address]  Basic metabolic panel     Status: Abnormal   Collection Time: 08/13/20  5:40 AM  Result Value Ref Range   Sodium 133 (L) 135 - 145 mmol/L   Potassium 4.4 3.5 - 5.1 mmol/L   Chloride 103 98 - 111 mmol/L   CO2 24 22 - 32 mmol/L   Glucose, Bld 83 70 - 99 mg/dL    Comment: Glucose reference range applies only to samples taken after fasting for at least 8 hours.   BUN 17 8 - 23 mg/dL   Creatinine, Ser 1.35 (H) 0.44 - 1.00 mg/dL   Calcium 7.5 (L) 8.9 - 10.3 mg/dL   GFR, Estimated 38 (L) >60 mL/min    Comment: (NOTE) Calculated using the CKD-EPI Creatinine Equation (2021)    Anion gap 6 5 - 15    Comment: Performed at [facility], [address] [place], [place] [address]  CEA     Status: Abnormal   Collection Time: 08/13/20  5:40 AM  Result Value Ref Range   CEA 7.8 (H) 0.0 - 4.7 ng/mL    Comment: (NOTE)                             Nonsmokers          <3.9                             Smokers             <5.6 [employer] Electrochemiluminescence Immunoassay (ECLIA) Values obtained with different assay methods or kits cannot be used interchangeably.  Results cannot be interpreted as absolute evidence of the presence or absence of malignant disease. Performed At: [facility] [place], [place] [id number] Rush Farmer MD FI:[id number]   CBC     Status: Abnormal   Collection Time: 08/14/20  4:13 AM  Result Value Ref Range   WBC 11.3 (H) 4.0 - 10.5 K/uL   RBC 3.63 (L) 3.87 - 5.11 MIL/uL    Hemoglobin 10.3 (L) 12.0 - 15.0 g/dL   HCT 33.0 (  L) 36.0 - 46.0 %   MCV 90.9 80.0 - 100.0 fL   MCH 28.4 26.0 - 34.0 pg   MCHC 31.2 30.0 - 36.0 g/dL   RDW 18.1 (H) 11.5 - 15.5 %   Platelets 410 (H) 150 - 400 K/uL   nRBC 0.0 0.0 - 0.2 %    Comment: Performed at [facility] [address] [place], [place] [address]  Basic metabolic panel     Status: Abnormal   Collection Time: 08/14/20  4:13 AM  Result Value Ref Range   Sodium 132 (L) 135 - 145 mmol/L   Potassium 4.8 3.5 - 5.1 mmol/L   Chloride 101 98 - 111 mmol/L   CO2 24 22 - 32 mmol/L   Glucose, Bld 93 70 - 99 mg/dL    Comment: Glucose reference range applies only to samples taken after fasting for at least 8 hours.   BUN 17 8 - 23 mg/dL   Creatinine, Ser 1.21 (H) 0.44 - 1.00 mg/dL   Calcium 7.5 (L) 8.9 - 10.3 mg/dL   GFR, Estimated 43 (L) >60 mL/min    Comment: (NOTE) Calculated using the CKD-EPI Creatinine Equation (2021)    Anion gap 7 5 - 15    Comment: Performed at [facility], [address], [place], [place] [address]   CT CHEST WO CONTRAST  Result Date: 08/14/2020 CLINICAL DATA:  History of rectal mass. EXAM: CT CHEST WITHOUT CONTRAST TECHNIQUE: Multidetector CT imaging of the chest was performed following the standard protocol without IV contrast. COMPARISON:  August 11, 2020. FINDINGS: Cardiovascular: Atherosclerosis of thoracic aorta is noted without aneurysm formation. Normal cardiac size. No pericardial effusion. Extensive coronary artery calcifications are noted. Mediastinum/Nodes: Large sliding-type hiatal hernia is noted. There also appears to be a large portion of transverse colon within the hiatal hernia as well. Thyroid gland is unremarkable. No definite adenopathy is noted. Lungs/Pleura: No pneumothorax is noted. Mild bibasilar subsegmental atelectasis is noted. Small bilateral pleural effusions are noted. Right apical scarring is noted. 3 mm nodule is noted in right middle lobe best seen on  image number 83 of series 3. Upper Abdomen: Multiple hepatic low densities are noted consistent with metastatic disease as noted on prior study. Musculoskeletal: No definite acute fracture is noted. IMPRESSION: Large sliding-type hiatal hernia. Large portion of transverse colon also appears to be within the hiatal hernia as well. 3 mm nodule is noted in right middle lobe. While this most likely is benign, metastatic disease cannot be excluded given the history of metastatic rectal cancer. Attention to this abnormality on follow-up imaging is recommended. Mild bibasilar subsegmental atelectasis is noted with small pleural effusions. Extensive coronary artery calcifications are noted. Aortic Atherosclerosis (ICD10-I70.0). Electronically Signed   By: Marijo Conception M.D.   On: 08/14/2020 14:08    Assessment:  Brenleigh Collet is a 87 y.o. female with with probable metastatic rectal cancer.  Lynn Rosario has a long standing history of iron deficiency.  CEA was 7.8 on 08/13/2020.  Abdomen and pelvic CT on 08/11/2020 was limited by motion artifact.  Findings were concerning for metastatic colorectal carcinoma.  There were multiple hypoattenuating liver masses (largest 3.3 cm) with a possible rectal mass with adjacent enlarged perirectal lymph nodes.  Mass appeared to cause a recto-rectal intussusception without evidence for obstruction.  There was a large hiatal hernia.  There was ill-defined prominence at the level of the pancreatic head.    Chest CT on 08/14/2020 revealed a 3 mm nodule in  the RML of unclear significance.  Flexible sigmoidoscopy on 08/12/2020 revealed a fungating and infiltrative completely obstructing large mass in the distal rectum.  Mass was circumferential.  Digital rectal exam revealed a 8 cm firm rectal mass palpated 2-10 cm from the anal verge.  Symptomatically, Lynn Rosario denies any complaints.  Lynn Rosario has ongoing rectal bleeding and diarrhea.  Plan:   1.   Probable metastatic rectal cancer              Patient unclear why Lynn Rosario is hospitalized.  Review pictures from sigmoidoscopy with patient and Lynn Rosario to help visualize current problem.  Discuss abdomen and pelvic CT and concern for metastatic disease to the liver.  Chest CT was reviewed.  Unclear significance of tiny RML nodule.             CEA is 7.8             Biopsy for confirmation of adenocarcinoma of the rectum is pending.             Discuss surgical consult with patient and Lynn Rosario today.  Patient has unresectable metastatic disease.  Re-review typical treatment with systemic chemotherapy followed by reassessment to determine resectability.   If resectable after chemotherapy, patient may be eligible for possible concurrent radiation and chemotherapy followed by immediate or staged resection.   If unresectable after chemotherapy, patient may receive ongoing chemotherapy or if progression at rectal primary, palliative radiation to primary site +/- 5FU/Xeloda.   Given multi-focal liver disease, patient's age and h/o follow-through, this route does not appear feasible.   Discuss patient's past history in the oncology clinic- not wanting to come to clinic and missing appointments.   Discuss treatment length and multiple visits, chemotherapy administration, radiation and possible surgery.  Patient notes that Lynn Rosario wants the "quickest thing" possible.  Discuss consideration for diverting colostomy to bypass the impending bowel obstruction.   Discuss consideration for palliative radiation given ongoing bleeding.   Could consider addition of Xeloda.             Patient is agreeable to discuss with radiation oncology.  Multiple questions asked and answered. 2.  Iron deficiency anemia             Hematocrit 22.7.  Hemoglobin   6.9.  MCV 91.9 on 08/10/2020.  Hematocrit 33.0.  Hemoglobin 10.3.  MCV 90.9 today.  Etiology of iron deficiency secondary to bleeding from rectal mass.             Lynn Rosario has received 2 units of PRBCs (last  08/12/2020).             Lynn Rosario received IV iron on 08/11/2020.             Maintain hemoglobin 7-8.  Transfuse PRBCs as needed.      3.  Code status             DNR.   Lequita Asal, MD  08/14/2020, 8:35 PM

## 2020-08-14 NOTE — Progress Notes (Signed)
PROGRESS NOTE    Lynn Rosario  DJM:[id number] DOB: April 06, 1929 DOA: 08/10/2020 PCP: Sherrin Daisy, MD     Brief Narrative:  Lynn Rosario is an 88 year old female with PMHx of chronic kidney disease and chronic anemia and hypothyroidism who presented to the hospital with rectal bleeding.  Rectal exam revealed a rectal mass.  Patient received blood transfusion due to blood loss anemia.  Patient underwent flex sigmoidoscopy on 4/5, oncology consulted.  New events last 24 hours / Subjective: Patient continues to have loose stools, mixed with smears of blood.  She has no other physical complaints.  Daughter at bedside states that she discussed with patient as well as her sister and they would like to pursue surgical consultation to see all of patient's options laid out before making any decisions.  Also asking for CT chest for staging purposes, as discussed with oncology.  Leaning toward SNF placement on discharge pending further discussion with surgery, oncology for definitive treatment plan.  Assessment & Plan:   Active Problems:   Lower GI bleed   Acute blood loss anemia   Vaginal bleeding   Acute kidney injury superimposed on CKD (HCC)   Essential hypertension   Other chronic pain   Hypothyroidism   Rectal mass   Liver metastases (HCC)   Acute blood loss anemia, GI blood loss -Status post 2 unit packed red blood cell transfusion -Status post IV iron -Hemoglobin stable currently 10.3  Rectal mass -Status post flex sigmoidoscopy 4/5 -Pathology pending -Oncology and palliative care medicine following -General surgery consulted today -CT chest for staging purposes  AKI on CKD stage IIIb -Baseline creatinine 1.27 -Resolved and back to baseline  Hypothyroidism -Continue Synthroid   DVT prophylaxis:  SCDs Start: 08/10/20 1451  Code Status: DNR Family Communication: Daughter at bedside Disposition Plan:  Status is: Inpatient  Remains inpatient appropriate  because:Inpatient level of care appropriate due to severity of illness   Dispo: The patient is from: Home              Anticipated d/c is to: SNF              Patient currently is not medically stable to d/c.  Pending further recommendation and discussion with oncology, palliative care medicine, general surgery   Difficult to place patient No      Consultants:   GI  GYN  Oncology  Palliative care medicine  General surgery   Antimicrobials:  Anti-infectives (From admission, onward)   None       Objective: Vitals:   08/13/20 1818 08/13/20 1932 08/13/20 2344 08/14/20 0510  BP: (!) 148/75 (!) 158/65 (!) 144/68 (!) 155/64  Pulse: 78 71 73 66  Resp: 16 17 17 17   Temp: 98.1 F (36.7 C) 98.4 F (36.9 C) 98.8 F (37.1 C) 98.3 F (36.8 C)  TempSrc: Oral Oral Oral   SpO2: 98% 98% 98% 98%  Weight:      Height:        Intake/Output Summary (Last 24 hours) at 08/14/2020 1054 Last data filed at 08/14/2020 1026 Gross per 24 hour  Intake 240 ml  Output --  Net 240 ml   Filed Weights   08/11/20 0523 08/12/20 1542 08/13/20 0618  Weight: 46 kg 46 kg 46 kg    Examination: General exam: Appears calm and comfortable, thin Respiratory system: Clear to auscultation. Respiratory effort normal. Cardiovascular system: S1 & S2 heard, RRR. No pedal edema. Gastrointestinal system: Abdomen is nondistended, soft and nontender. Normal bowel sounds heard.  Central nervous system: Alert and oriented. Non focal exam. Speech clear  Extremities: Symmetric in appearance bilaterally  Skin: No rashes, lesions or ulcers on exposed skin  Psychiatry: Judgement and insight appear stable. Mood & affect appropriate.    Data Reviewed: I have personally reviewed following labs and imaging studies  CBC: Recent Labs  Lab 08/10/20 1019 08/10/20 1711 08/10/20 2048 08/11/20 0559 08/11/20 1617 08/12/20 0508 08/13/20 0540 08/14/20 0413  WBC 11.6*  --   --  8.3  --  13.3* 9.1 11.3*  NEUTROABS  8.9*  --   --   --   --   --   --   --   HGB 6.9*   < > 8.4* 8.3* 8.4* 7.9* 10.2* 10.3*  HCT 22.7*   < > 26.2* 25.9*  --  24.5* 31.6* 33.0*  MCV 91.9  --   --  89.3  --  89.7 88.5 90.9  PLT 508*  --   --  418*  --  382 377 410*   < > = values in this interval not displayed.   Basic Metabolic Panel: Recent Labs  Lab 08/10/20 1019 08/11/20 0559 08/13/20 0540 08/14/20 0413  NA 136 135 133* 132*  K 4.6 5.0 4.4 4.8  CL 102 104 103 101  CO2 24 25 24 24   GLUCOSE 111* 81 83 93  BUN 33* 29* 17 17  CREATININE 1.67* 1.40* 1.35* 1.21*  CALCIUM 7.7* 7.3* 7.5* 7.5*   GFR: Estimated Creatinine Clearance: 22.6 mL/min (A) (by C-G formula based on SCr of 1.21 mg/dL (H)). Liver Function Tests: Recent Labs  Lab 08/10/20 1019 08/11/20 0559  AST 29 26  ALT 14 13  ALKPHOS 101 84  BILITOT 0.4 0.9  PROT 6.1* 5.2*  ALBUMIN 3.1* 2.6*   No results for input(s): LIPASE, AMYLASE in the last 168 hours. No results for input(s): AMMONIA in the last 168 hours. Coagulation Profile: Recent Labs  Lab 08/10/20 1019  INR 1.1   Cardiac Enzymes: No results for input(s): CKTOTAL, CKMB, CKMBINDEX, TROPONINI in the last 168 hours. BNP (last 3 results) No results for input(s): PROBNP in the last 8760 hours. HbA1C: No results for input(s): HGBA1C in the last 72 hours. CBG: No results for input(s): GLUCAP in the last 168 hours. Lipid Profile: No results for input(s): CHOL, HDL, LDLCALC, TRIG, CHOLHDL, LDLDIRECT in the last 72 hours. Thyroid Function Tests: No results for input(s): TSH, T4TOTAL, FREET4, T3FREE, THYROIDAB in the last 72 hours. Anemia Panel: No results for input(s): VITAMINB12, FOLATE, FERRITIN, TIBC, IRON, RETICCTPCT in the last 72 hours. Sepsis Labs: No results for input(s): PROCALCITON, LATICACIDVEN in the last 168 hours.  Recent Results (from the past 240 hour(s))  Resp Panel by RT-PCR (Flu A&B, Covid) Nasopharyngeal Swab     Status: None   Collection Time: 08/10/20 11:24 AM    Specimen: Nasopharyngeal Swab; Nasopharyngeal(NP) swabs in vial transport medium  Result Value Ref Range Status   SARS Coronavirus 2 by RT PCR NEGATIVE NEGATIVE Final    Comment: (NOTE) SARS-CoV-2 target nucleic acids are NOT DETECTED.  The SARS-CoV-2 RNA is generally detectable in upper respiratory specimens during the acute phase of infection. The lowest concentration of SARS-CoV-2 viral copies this assay can detect is 138 copies/mL. A negative result does not preclude SARS-Cov-2 infection and should not be used as the sole basis for treatment or other patient management decisions. A negative result may occur with  improper specimen collection/handling, submission of specimen other than nasopharyngeal swab,  presence of viral mutation(s) within the areas targeted by this assay, and inadequate number of viral copies(<138 copies/mL). A negative result must be combined with clinical observations, patient history, and epidemiological information. The expected result is Negative.  Fact Sheet for Patients:  EntrepreneurPulse.com.au  Fact Sheet for Healthcare Providers:  IncredibleEmployment.be  This test is no t yet approved or cleared by the [place] FDA and  has been authorized for detection and/or diagnosis of SARS-CoV-2 by FDA under an Emergency Use Authorization (EUA). This EUA will remain  in effect (meaning this test can be used) for the duration of the COVID-19 declaration under Section 564(b)(1) of the Act, 21 U.S.C.section 360bbb-3(b)(1), unless the authorization is terminated  or revoked sooner.       Influenza A by PCR NEGATIVE NEGATIVE Final   Influenza B by PCR NEGATIVE NEGATIVE Final    Comment: (NOTE) The Xpert Xpress SARS-CoV-2/FLU/RSV plus assay is intended as an aid in the diagnosis of influenza from Nasopharyngeal swab specimens and should not be used as a sole basis for treatment. Nasal washings and aspirates are  unacceptable for Xpert Xpress SARS-CoV-2/FLU/RSV testing.  Fact Sheet for Patients: EntrepreneurPulse.com.au  Fact Sheet for Healthcare Providers: IncredibleEmployment.be  This test is not yet approved or cleared by the [place] FDA and has been authorized for detection and/or diagnosis of SARS-CoV-2 by FDA under an Emergency Use Authorization (EUA). This EUA will remain in effect (meaning this test can be used) for the duration of the COVID-19 declaration under Section 564(b)(1) of the Act, 21 U.S.C. section 360bbb-3(b)(1), unless the authorization is terminated or revoked.  Performed at [facility], [address], [place], [place] [address]   Urine culture     Status: Abnormal   Collection Time: 08/10/20  2:53 PM   Specimen: Urine, Random  Result Value Ref Range Status   Specimen Description   Final    URINE, RANDOM Performed at [facility], [address], [place], [place] [address]    Special Requests   Final    NONE Performed at [facility], [address] [place], [place] [address]    Culture MULTIPLE SPECIES PRESENT, SUGGEST RECOLLECTION (A)  Final   Report Status 08/12/2020 FINAL  Final      Radiology Studies: No results found.    Scheduled Meds: . acetaminophen  650 mg Oral QPC lunch  . levothyroxine  75 mcg Oral Q0600  . multivitamin with minerals  1 tablet Oral Daily  . pantoprazole (PROTONIX) IV  40 mg Intravenous Q12H  . sodium bicarbonate  650 mg Oral BID   Continuous Infusions: . sodium chloride    . promethazine (PHENERGAN) injection Stopped (08/13/20 0010)     LOS: 4 days      Time spent: 25 minutes   Dessa Phi, DO [facility] 08/14/2020, 10:54 AM   Available via Epic secure chat 7am-7pm After these hours, please refer to coverage provider listed on [email or web address]

## 2020-08-15 LAB — CBC
HCT: 34.4 % — ABNORMAL LOW (ref 36.0–46.0)
Hemoglobin: 11.2 g/dL — ABNORMAL LOW (ref 12.0–15.0)
MCH: 29.3 pg (ref 26.0–34.0)
MCHC: 32.6 g/dL (ref 30.0–36.0)
MCV: 90.1 fL (ref 80.0–100.0)
Platelets: 413 10*3/uL — ABNORMAL HIGH (ref 150–400)
RBC: 3.82 MIL/uL — ABNORMAL LOW (ref 3.87–5.11)
RDW: 18.2 % — ABNORMAL HIGH (ref 11.5–15.5)
WBC: 12 10*3/uL — ABNORMAL HIGH (ref 4.0–10.5)
nRBC: 0 % (ref 0.0–0.2)

## 2020-08-15 LAB — BASIC METABOLIC PANEL
Anion gap: 7 (ref 5–15)
BUN: 16 mg/dL (ref 8–23)
CO2: 25 mmol/L (ref 22–32)
Calcium: 7.7 mg/dL — ABNORMAL LOW (ref 8.9–10.3)
Chloride: 101 mmol/L (ref 98–111)
Creatinine, Ser: 1.09 mg/dL — ABNORMAL HIGH (ref 0.44–1.00)
GFR, Estimated: 49 mL/min — ABNORMAL LOW (ref >60)
Glucose, Bld: 98 mg/dL (ref 70–99)
Potassium: 4.9 mmol/L (ref 3.5–5.1)
Sodium: 133 mmol/L — ABNORMAL LOW (ref 135–145)

## 2020-08-15 LAB — SURGICAL PATHOLOGY

## 2020-08-15 MED ORDER — ALPRAZOLAM 0.25 MG PO TABS
0.2500 mg | ORAL_TABLET | Freq: Every evening | ORAL | Status: DC | PRN
  Administered 2020-08-15: 0.25 mg via ORAL
  Filled 2020-08-15: qty 1

## 2020-08-15 MED ORDER — MORPHINE SULFATE (PF) 2 MG/ML IV SOLN: 1.0000 mg | INTRAVENOUS | Status: DC | PRN

## 2020-08-15 NOTE — Progress Notes (Signed)
PROGRESS NOTE    Lynn Rosario  WJX:[id number] DOB: 19-Apr-1929 DOA: 08/10/2020 PCP: Sherrin Daisy, MD     Brief Narrative:  Lynn Rosario is an 87 year old female with PMHx of chronic kidney disease and chronic anemia and hypothyroidism who presented to the hospital with rectal bleeding.  Rectal exam revealed a rectal mass.  Patient received blood transfusion due to blood loss anemia.  Patient underwent flex sigmoidoscopy on 4/5, oncology consulted as well as palliative care medicine, surgery, radiation oncology.  New events last 24 hours / Subjective: Patient has no complaints on examination today.  States that she and family has not made any decisions regarding her next plan of care until they gather all the information.  They are awaiting radiation oncology input at this point.  Assessment & Plan:   Principal Problem:   Rectal mass Active Problems:   Lower GI bleed   Acute blood loss anemia   Vaginal bleeding   Acute kidney injury superimposed on CKD (HCC)   Essential hypertension   Other chronic pain   Hypothyroidism   Liver metastases (HCC)   Acute blood loss anemia, GI blood loss -Status post 2 unit packed red blood cell transfusion -Status post IV iron -Hemoglobin stable currently 11.2  Rectal mass -Status post flex sigmoidoscopy 4/5 -Pathology pending -CEA elevated 7.8 -CT A/P revealed multiple hypoattenuating liver masses, rectal mass with perirectal lymph node enlargement, ill-defined prominence at the level of head of pancreas -CT chest revealed 3 mm nodule right middle lobe -Oncology and palliative care medicine following -General surgery spoke with family regarding palliative diverting ostomy to avoid obstructive symptom, patient is not a candidate for upfront surgical resection as a curative procedure, recommended proceeding with chemotherapy initially -Radiation oncology consulted  AKI on CKD stage IIIb -Baseline creatinine 1.27 -Resolved and back to  baseline  Hypothyroidism -Continue Synthroid   DVT prophylaxis:  SCDs Start: 08/10/20 1451  Code Status: DNR Family Communication: No family at bedside Disposition Plan:  Status is: Inpatient  Remains inpatient appropriate because:Inpatient level of care appropriate due to severity of illness   Dispo: The patient is from: Home              Anticipated d/c is to: SNF              Patient currently is not medically stable to d/c.  Pending further recommendation and discussion with oncology, palliative care medicine, general surgery, radiation oncology.  Family has not made any decisions regarding next plan of care for her metastatic disease.   Difficult to place patient No      Consultants:   GI  GYN  Oncology  Palliative care medicine  General surgery  Radiation oncology   Antimicrobials:  Anti-infectives (From admission, onward)   None       Objective: Vitals:   08/14/20 2351 08/15/20 0434 08/15/20 0500 08/15/20 0737  BP: (!) 145/89 (!) 150/72  (!) 144/75  Pulse: 80 79  85  Resp: 18 18  18   Temp: 97.7 F (36.5 C) 98.4 F (36.9 C)  97.7 F (36.5 C)  TempSrc:    Oral  SpO2: 99% 97%  97%  Weight:   47.1 kg   Height:        Intake/Output Summary (Last 24 hours) at 08/15/2020 1009 Last data filed at 08/15/2020 0434 Gross per 24 hour  Intake 120 ml  Output 150 ml  Net -30 ml   Filed Weights   08/12/20 1542 08/13/20 0618 08/15/20 0500  Weight: 46 kg 46 kg 47.1 kg    Examination: General exam: Appears calm and comfortable  Respiratory system: Clear to auscultation. Respiratory effort normal. Cardiovascular system: S1 & S2 heard, RRR. No pedal edema. Gastrointestinal system: Abdomen is nondistended, soft and nontender. Normal bowel sounds heard. Central nervous system: Alert and oriented. Non focal exam. Speech clear  Extremities: Symmetric in appearance bilaterally  Skin: No rashes, lesions or ulcers on exposed skin  Psychiatry: Judgement and  insight appear stable. Mood & affect appropriate.   Data Reviewed: I have personally reviewed following labs and imaging studies  CBC: Recent Labs  Lab 08/10/20 1019 08/10/20 1711 08/11/20 0559 08/11/20 1617 08/12/20 0508 08/13/20 0540 08/14/20 0413 08/15/20 0650  WBC 11.6*  --  8.3  --  13.3* 9.1 11.3* 12.0*  NEUTROABS 8.9*  --   --   --   --   --   --   --   HGB 6.9*   < > 8.3* 8.4* 7.9* 10.2* 10.3* 11.2*  HCT 22.7*   < > 25.9*  --  24.5* 31.6* 33.0* 34.4*  MCV 91.9  --  89.3  --  89.7 88.5 90.9 90.1  PLT 508*  --  418*  --  382 377 410* 413*   < > = values in this interval not displayed.   Basic Metabolic Panel: Recent Labs  Lab 08/10/20 1019 08/11/20 0559 08/13/20 0540 08/14/20 0413 08/15/20 0650  NA 136 135 133* 132* 133*  K 4.6 5.0 4.4 4.8 4.9  CL 102 104 103 101 101  CO2 24 25 24 24 25   GLUCOSE 111* 81 83 93 98  BUN 33* 29* 17 17 16   CREATININE 1.67* 1.40* 1.35* 1.21* 1.09*  CALCIUM 7.7* 7.3* 7.5* 7.5* 7.7*   GFR: Estimated Creatinine Clearance: 25.1 mL/min (A) (by C-G formula based on SCr of 1.09 mg/dL (H)). Liver Function Tests: Recent Labs  Lab 08/10/20 1019 08/11/20 0559  AST 29 26  ALT 14 13  ALKPHOS 101 84  BILITOT 0.4 0.9  PROT 6.1* 5.2*  ALBUMIN 3.1* 2.6*   No results for input(s): LIPASE, AMYLASE in the last 168 hours. No results for input(s): AMMONIA in the last 168 hours. Coagulation Profile: Recent Labs  Lab 08/10/20 1019  INR 1.1   Cardiac Enzymes: No results for input(s): CKTOTAL, CKMB, CKMBINDEX, TROPONINI in the last 168 hours. BNP (last 3 results) No results for input(s): PROBNP in the last 8760 hours. HbA1C: No results for input(s): HGBA1C in the last 72 hours. CBG: No results for input(s): GLUCAP in the last 168 hours. Lipid Profile: No results for input(s): CHOL, HDL, LDLCALC, TRIG, CHOLHDL, LDLDIRECT in the last 72 hours. Thyroid Function Tests: No results for input(s): TSH, T4TOTAL, FREET4, T3FREE, THYROIDAB in the  last 72 hours. Anemia Panel: No results for input(s): VITAMINB12, FOLATE, FERRITIN, TIBC, IRON, RETICCTPCT in the last 72 hours. Sepsis Labs: No results for input(s): PROCALCITON, LATICACIDVEN in the last 168 hours.  Recent Results (from the past 240 hour(s))  Resp Panel by RT-PCR (Flu A&B, Covid) Nasopharyngeal Swab     Status: None   Collection Time: 08/10/20 11:24 AM   Specimen: Nasopharyngeal Swab; Nasopharyngeal(NP) swabs in vial transport medium  Result Value Ref Range Status   SARS Coronavirus 2 by RT PCR NEGATIVE NEGATIVE Final    Comment: (NOTE) SARS-CoV-2 target nucleic acids are NOT DETECTED.  The SARS-CoV-2 RNA is generally detectable in upper respiratory specimens during the acute phase of infection. The lowest concentration of  SARS-CoV-2 viral copies this assay can detect is 138 copies/mL. A negative result does not preclude SARS-Cov-2 infection and should not be used as the sole basis for treatment or other patient management decisions. A negative result may occur with  improper specimen collection/handling, submission of specimen other than nasopharyngeal swab, presence of viral mutation(s) within the areas targeted by this assay, and inadequate number of viral copies(<138 copies/mL). A negative result must be combined with clinical observations, patient history, and epidemiological information. The expected result is Negative.  Fact Sheet for Patients:  EntrepreneurPulse.com.au  Fact Sheet for Healthcare Providers:  IncredibleEmployment.be  This test is no t yet approved or cleared by the [place] FDA and  has been authorized for detection and/or diagnosis of SARS-CoV-2 by FDA under an Emergency Use Authorization (EUA). This EUA will remain  in effect (meaning this test can be used) for the duration of the COVID-19 declaration under Section 564(b)(1) of the Act, 21 U.S.C.section 360bbb-3(b)(1), unless the authorization is  terminated  or revoked sooner.       Influenza A by PCR NEGATIVE NEGATIVE Final   Influenza B by PCR NEGATIVE NEGATIVE Final    Comment: (NOTE) The Xpert Xpress SARS-CoV-2/FLU/RSV plus assay is intended as an aid in the diagnosis of influenza from Nasopharyngeal swab specimens and should not be used as a sole basis for treatment. Nasal washings and aspirates are unacceptable for Xpert Xpress SARS-CoV-2/FLU/RSV testing.  Fact Sheet for Patients: EntrepreneurPulse.com.au  Fact Sheet for Healthcare Providers: IncredibleEmployment.be  This test is not yet approved or cleared by the [place] FDA and has been authorized for detection and/or diagnosis of SARS-CoV-2 by FDA under an Emergency Use Authorization (EUA). This EUA will remain in effect (meaning this test can be used) for the duration of the COVID-19 declaration under Section 564(b)(1) of the Act, 21 U.S.C. section 360bbb-3(b)(1), unless the authorization is terminated or revoked.  Performed at [facility], [address] [place], [place] [address]   Urine culture     Status: Abnormal   Collection Time: 08/10/20  2:53 PM   Specimen: Urine, Random  Result Value Ref Range Status   Specimen Description   Final    URINE, RANDOM Performed at [facility], [address] [place], [place] [address]    Special Requests   Final    NONE Performed at [facility], [address] [place], [place] [address]    Culture MULTIPLE SPECIES PRESENT, SUGGEST RECOLLECTION (A)  Final   Report Status 08/12/2020 FINAL  Final      Radiology Studies: CT CHEST WO CONTRAST  Result Date: 08/14/2020 CLINICAL DATA:  History of rectal mass. EXAM: CT CHEST WITHOUT CONTRAST TECHNIQUE: Multidetector CT imaging of the chest was performed following the standard protocol without IV contrast. COMPARISON:  August 11, 2020. FINDINGS: Cardiovascular: Atherosclerosis of thoracic aorta  is noted without aneurysm formation. Normal cardiac size. No pericardial effusion. Extensive coronary artery calcifications are noted. Mediastinum/Nodes: Large sliding-type hiatal hernia is noted. There also appears to be a large portion of transverse colon within the hiatal hernia as well. Thyroid gland is unremarkable. No definite adenopathy is noted. Lungs/Pleura: No pneumothorax is noted. Mild bibasilar subsegmental atelectasis is noted. Small bilateral pleural effusions are noted. Right apical scarring is noted. 3 mm nodule is noted in right middle lobe best seen on image number 83 of series 3. Upper Abdomen: Multiple hepatic low densities are noted consistent with metastatic disease as noted on prior study. Musculoskeletal: No  definite acute fracture is noted. IMPRESSION: Large sliding-type hiatal hernia. Large portion of transverse colon also appears to be within the hiatal hernia as well. 3 mm nodule is noted in right middle lobe. While this most likely is benign, metastatic disease cannot be excluded given the history of metastatic rectal cancer. Attention to this abnormality on follow-up imaging is recommended. Mild bibasilar subsegmental atelectasis is noted with small pleural effusions. Extensive coronary artery calcifications are noted. Aortic Atherosclerosis (ICD10-I70.0). Electronically Signed   By: Marijo Conception M.D.   On: 08/14/2020 14:08      Scheduled Meds: . acetaminophen  650 mg Oral QPC lunch  . levothyroxine  75 mcg Oral Q0600  . multivitamin with minerals  1 tablet Oral Daily  . pantoprazole (PROTONIX) IV  40 mg Intravenous Q12H   Continuous Infusions: . sodium chloride    . promethazine (PHENERGAN) injection Stopped (08/13/20 0010)     LOS: 5 days      Time spent: 25 minutes   Dessa Phi, DO [facility] 08/15/2020, 10:09 AM   Available via Epic secure chat 7am-7pm After these hours, please refer to coverage provider listed on [email or web address]

## 2020-08-15 NOTE — Progress Notes (Signed)
Pt Alert and fatigued is sleeping the majority of shift. Mobility limited to bed currently. Receiving anxiety and pain meds with good effect. Pt with decreased appetite. Noted to have gross hematuria. Will continue to monitor

## 2020-08-15 NOTE — Progress Notes (Signed)
PT Cancellation Note  Patient Details Name: Lynn Rosario MRN: [id number] DOB: 1928-10-30   Cancelled Treatment:    Reason Eval/Treat Not Completed: Other (comment).  PT order cancelled by Palliative Care NP Laurann [place].  Per chart family interested in hospice home.  Leitha Bleak, PT 08/15/20, 2:31 PM

## 2020-08-15 NOTE — TOC Progression Note (Signed)
Transition of Care [facility]) - Progression Note    Patient Details  Name: Lynn Rosario MRN: [id number] Date of Birth: 12/18/1928  Transition of Care [facility]) CM/SW [place], RN Phone Number: 08/15/2020, 1:42 PM  Clinical Narrative:   TOC spoke with daughter.  Daughter states that her mother has decided on no surgery, no chemo and will meet with radiation today.  [facility] has been consulted, Liaison in to visit patient and family, will notify of bed availability at hospice house.  Daughter stated that she and her mother are both amenable to hospice house at this time.  They are currently awaiting word on a bed, and continued information from providers.  TOC contact information given, TOC to follow through discharge.    Expected Discharge Plan:  (TBD) Barriers to Discharge: Continued Medical Work up  Expected Discharge Plan and Services Expected Discharge Plan:  (TBD)     Post Acute Care Choice: NA (TBD) Living arrangements for the past 2 months: Single Family Home                 DME Arranged:  (TBD)         HH Arranged:  (TBD)           Social Determinants of Health (SDOH) Interventions    Readmission Risk Interventions No flowsheet data found.

## 2020-08-15 NOTE — Progress Notes (Addendum)
[place] Room [facility]) Hospital Liaison RN note:  Received request from Asencion Gowda, NP for family interest in [place]. Chart reviewed and eligibility was approved. Spoke with daughter, [name], to confirm interest and explain services. She verbalized understanding and all questions were answered. Hospice Home will have a room for patient this evening. Daughter is signing consents at 4:30 at the [facility] and [place], [facility] is arranging transport for [facility] care team is aware.  Please call with any hospice related questions or concerns.  Thank you for the opportunity to participate in this patient's care.  Zandra Abts, RN [facility] Liaison  [phone]

## 2020-08-15 NOTE — Care Management Important Message (Signed)
Important Message  Patient Details  Name: Raea Magallon MRN: [id number] Date of Birth: 1929/01/11   Medicare Important Message Given:  Other (see comment)  Per chart, on comfort measures and requesting hospice.  Medicare IM withheld at this time out of respect for patient and family.     Dannette Barbara 08/15/2020, 1:05 PM

## 2020-08-15 NOTE — Discharge Summary (Signed)
Physician Discharge Summary  Mari Battaglia WJX:[id number] DOB: [birth year] DOA: 08/10/2020  PCP: Sherrin Daisy, MD  Admit date: 08/10/2020 Discharge date: 08/15/2020  Admitted From: Home Disposition:  Hospice   CODE STATUS: DNR  Diet recommendation: Regular   Brief/Interim Summary: Lynn Rosario is an 87 year old female with PMHx of chronic kidney disease and chronic anemia and hypothyroidism who presented to the hospital with rectal bleeding.  Rectal exam revealed a rectal mass.  Patient received blood transfusion due to blood loss anemia.  Patient underwent flex sigmoidoscopy on 4/5, oncology consulted as well as palliative care medicine, surgery, radiation oncology. After discussion with consultants, patient ultimately decided to transition to hospice and focus on comfort only.   Discharge Diagnoses:  Principal Problem:   Rectal mass Active Problems:   Lower GI bleed   Acute blood loss anemia   Vaginal bleeding   Acute kidney injury superimposed on CKD (HCC)   Essential hypertension   Other chronic pain   Hypothyroidism   Liver metastases (HCC)   Acute blood loss anemia, GI blood loss -Status post 2 unit packed red blood cell transfusion -Status post IV iron -Hemoglobin stable currently 11.2  Rectal mass -Status post flex sigmoidoscopy 4/5 -Pathology pending -CEA elevated 7.8 -CT A/P revealed multiple hypoattenuating liver masses, rectal mass with perirectal lymph node enlargement, ill-defined prominence at the level of head of pancreas -CT chest revealed 3 mm nodule right middle lobe -Oncology and palliative care medicine following -General surgery spoke with family regarding palliative diverting ostomy to avoid obstructive symptom, patient is not a candidate for upfront surgical resection as a curative procedure, recommended proceeding with chemotherapy initially -Radiation oncology consulted -Ultimately, patient decided to focus on comfort only and declined surgery,  chemo   AKI on CKD stage IIIb -Baseline creatinine 1.27 -Resolved and back to baseline  Hypothyroidism -Continue Synthroid   Discharge Instructions   Allergies as of 08/15/2020      Reactions   Celecoxib Other (See Comments)   Decreased kidney function    Clarithromycin Other (See Comments)   Loss of taste       Medication List    STOP taking these medications   acetaminophen 325 MG tablet Commonly known as: TYLENOL   ALPRAZolam 0.25 MG tablet Commonly known as: XANAX   amLODipine 2.5 MG tablet Commonly known as: NORVASC   aspirin EC 81 MG tablet   Cetirizine HCl 10 MG Caps   Cyanocobalamin 2500 MCG Subl   ferrous sulfate 325 (65 FE) MG EC tablet   furosemide 20 MG tablet Commonly known as: LASIX   HYDROcodone-acetaminophen 5-325 MG tablet Commonly known as: NORCO/VICODIN   Multi-Vitamins Tabs   sodium bicarbonate 650 MG tablet     TAKE these medications   levothyroxine 75 MCG tablet Commonly known as: SYNTHROID TAKE 1 TABLET ON AN EMPTY STOMACH WITH A GLASS OF WATER AT LEAST 30 TO 60 MINUTES BEFORE BREAKFAST.       Allergies  Allergen Reactions  . Celecoxib Other (See Comments)    Decreased kidney function   . Clarithromycin Other (See Comments)    Loss of taste     Consultations:  GI  GYN  Oncology  Palliative care medicine  General surgery  Radiation oncology   Procedures/Studies: CT ABDOMEN PELVIS WO CONTRAST  Result Date: 08/11/2020 CLINICAL DATA:  Rectal bleeding.  Possible vaginal bleeding. EXAM: CT ABDOMEN AND PELVIS WITHOUT CONTRAST TECHNIQUE: Multidetector CT imaging of the abdomen and pelvis was performed following the standard protocol without IV contrast.  COMPARISON:  None. FINDINGS: Lower chest: There is compressive atelectasis at the lung bases.Aortic calcifications are noted. The heart size is normal. The intracardiac blood pool is hypodense relative to the adjacent myocardium consistent with anemia. There are small  bilateral pleural effusions. Hepatobiliary: There are multiple indeterminate liver masses. The largest in the left hepatic lobe measures approximately 2.3 cm (axial series 2, image 16). The largest in the right hepatic lobe measures approximately 3.3 cm (axial series 2, image 22). Normal gallbladder.There is no biliary ductal dilation. Pancreas: There is questionable fullness the level of the head of the pancreas, poorly evaluated secondary to motion artifact and lack of IV contrast. Spleen: Unremarkable. Adrenals/Urinary Tract: --Adrenal glands: Unremarkable. --Right kidney/ureter: No hydronephrosis or radiopaque kidney stones. --Left kidney/ureter: No hydronephrosis or radiopaque kidney stones. --Urinary bladder: Unremarkable. Stomach/Bowel: --Stomach/Duodenum: The majority of the stomach is herniated into the thoracic cavity. --Small bowel: Unremarkable. --Colon: There is a large amount of stool in the colon. There is a possible mass at the level of the rectum. At this level, there is a recto-rectal intussusception without evidence for an obstruction. There is scattered colonic diverticula without CT evidence for diverticulitis. Portions of the transverse colon are herniated into the thoracic cavity. --Appendix: Normal. Vascular/Lymphatic: Atherosclerotic calcification is present within the non-aneurysmal abdominal aorta, without hemodynamically significant stenosis. --No retroperitoneal lymphadenopathy. --No mesenteric lymphadenopathy. --there are enlarged perirectal lymph nodes (axial series 2, image 56). Reproductive: Status post hysterectomy. No adnexal mass. There is pelvic floor laxity with a probable rectocele. Other: No ascites or free air. The abdominal wall is normal. Musculoskeletal. There is a significant degenerative levoscoliosis of the lumbar spine with advanced degenerative changes throughout the lumbar spine. There are degenerative changes of both hips. There is no acute fracture. There is an  area of sclerosis involving the anterior right iliac bone (axial series 2, image 41). IMPRESSION: 1. Exam significantly limited by motion artifact and lack of IV contrast. 2. Findings concerning for metastatic colorectal carcinoma. There are multiple hypoattenuating liver masses as detailed above in addition to a possible rectal mass with adjacent enlarged perirectal lymph nodes. This rectal mass appears to cause a recto-rectal intussusception without evidence for obstruction. A repeat contrast enhanced CT would be useful for further evaluation. 3. Large hiatal hernia containing the majority of the stomach and portions of the transverse colon. 4. Ill-defined prominence at the level of the head of the pancreas. A follow-up contrast enhanced CT would be useful for further evaluation of this area. 5. Small bilateral pleural effusions. 6.  Aortic Atherosclerosis (ICD10-I70.0). Electronically Signed   By: Constance Holster M.D.   On: 08/11/2020 22:20   CT CHEST WO CONTRAST  Result Date: 08/14/2020 CLINICAL DATA:  History of rectal mass. EXAM: CT CHEST WITHOUT CONTRAST TECHNIQUE: Multidetector CT imaging of the chest was performed following the standard protocol without IV contrast. COMPARISON:  August 11, 2020. FINDINGS: Cardiovascular: Atherosclerosis of thoracic aorta is noted without aneurysm formation. Normal cardiac size. No pericardial effusion. Extensive coronary artery calcifications are noted. Mediastinum/Nodes: Large sliding-type hiatal hernia is noted. There also appears to be a large portion of transverse colon within the hiatal hernia as well. Thyroid gland is unremarkable. No definite adenopathy is noted. Lungs/Pleura: No pneumothorax is noted. Mild bibasilar subsegmental atelectasis is noted. Small bilateral pleural effusions are noted. Right apical scarring is noted. 3 mm nodule is noted in right middle lobe best seen on image number 83 of series 3. Upper Abdomen: Multiple hepatic low densities are  noted  consistent with metastatic disease as noted on prior study. Musculoskeletal: No definite acute fracture is noted. IMPRESSION: Large sliding-type hiatal hernia. Large portion of transverse colon also appears to be within the hiatal hernia as well. 3 mm nodule is noted in right middle lobe. While this most likely is benign, metastatic disease cannot be excluded given the history of metastatic rectal cancer. Attention to this abnormality on follow-up imaging is recommended. Mild bibasilar subsegmental atelectasis is noted with small pleural effusions. Extensive coronary artery calcifications are noted. Aortic Atherosclerosis (ICD10-I70.0). Electronically Signed   By: Marijo Conception M.D.   On: 08/14/2020 14:08       Discharge Exam: Vitals:   08/15/20 0737 08/15/20 1159  BP: (!) 144/75 117/60  Pulse: 85 75  Resp: 18 20  Temp: 97.7 F (36.5 C) 98.3 F (36.8 C)  SpO2: 97% 99%    General exam: Appears calm and comfortable  Respiratory system: Clear to auscultation. Respiratory effort normal. Cardiovascular system: S1 & S2 heard, RRR. No pedal edema. Gastrointestinal system: Abdomen is nondistended, soft and nontender. Normal bowel sounds heard. Central nervous system: Alert and oriented. Non focal exam. Speech clear  Extremities: Symmetric in appearance bilaterally  Skin: No rashes, lesions or ulcers on exposed skin  Psychiatry: Judgement and insight appear stable. Mood & affect appropriate.    The results of significant diagnostics from this hospitalization (including imaging, microbiology, ancillary and laboratory) are listed below for reference.     Microbiology: Recent Results (from the past 240 hour(s))  Resp Panel by RT-PCR (Flu A&B, Covid) Nasopharyngeal Swab     Status: None   Collection Time: 08/10/20 11:24 AM   Specimen: Nasopharyngeal Swab; Nasopharyngeal(NP) swabs in vial transport medium  Result Value Ref Range Status   SARS Coronavirus 2 by RT PCR NEGATIVE NEGATIVE  Final    Comment: (NOTE) SARS-CoV-2 target nucleic acids are NOT DETECTED.  The SARS-CoV-2 RNA is generally detectable in upper respiratory specimens during the acute phase of infection. The lowest concentration of SARS-CoV-2 viral copies this assay can detect is 138 copies/mL. A negative result does not preclude SARS-Cov-2 infection and should not be used as the sole basis for treatment or other patient management decisions. A negative result may occur with  improper specimen collection/handling, submission of specimen other than nasopharyngeal swab, presence of viral mutation(s) within the areas targeted by this assay, and inadequate number of viral copies(<138 copies/mL). A negative result must be combined with clinical observations, patient history, and epidemiological information. The expected result is Negative.  Fact Sheet for Patients:  EntrepreneurPulse.com.au  Fact Sheet for Healthcare Providers:  IncredibleEmployment.be  This test is no t yet approved or cleared by the [place] FDA and  has been authorized for detection and/or diagnosis of SARS-CoV-2 by FDA under an Emergency Use Authorization (EUA). This EUA will remain  in effect (meaning this test can be used) for the duration of the COVID-19 declaration under Section 564(b)(1) of the Act, 21 U.S.C.section 360bbb-3(b)(1), unless the authorization is terminated  or revoked sooner.       Influenza A by PCR NEGATIVE NEGATIVE Final   Influenza B by PCR NEGATIVE NEGATIVE Final    Comment: (NOTE) The Xpert Xpress SARS-CoV-2/FLU/RSV plus assay is intended as an aid in the diagnosis of influenza from Nasopharyngeal swab specimens and should not be used as a sole basis for treatment. Nasal washings and aspirates are unacceptable for Xpert Xpress SARS-CoV-2/FLU/RSV testing.  Fact Sheet for Patients: EntrepreneurPulse.com.au  Fact Sheet for Healthcare  Providers: IncredibleEmployment.be  This test is not yet approved or cleared by the [place] and has been authorized for detection and/or diagnosis of SARS-CoV-2 by FDA under an Emergency Use Authorization (EUA). This EUA will remain in effect (meaning this test can be used) for the duration of the COVID-19 declaration under Section 564(b)(1) of the Act, 21 U.S.C. section 360bbb-3(b)(1), unless the authorization is terminated or revoked.  Performed at [facility], [address] [place], [place] [address]   Urine culture     Status: Abnormal   Collection Time: 08/10/20  2:53 PM   Specimen: Urine, Random  Result Value Ref Range Status   Specimen Description   Final    URINE, RANDOM Performed at [facility], [address] [place], [place] [address]    Special Requests   Final    NONE Performed at [facility], [address] [place], [place] [address]    Culture MULTIPLE SPECIES PRESENT, SUGGEST RECOLLECTION (A)  Final   Report Status 08/12/2020 FINAL  Final     Labs: BNP (last 3 results) Recent Labs    07/15/20 2106  BNP 694.8*   Basic Metabolic Panel: Recent Labs  Lab 08/10/20 1019 08/11/20 0559 08/13/20 0540 08/14/20 0413 08/15/20 0650  NA 136 135 133* 132* 133*  K 4.6 5.0 4.4 4.8 4.9  CL 102 104 103 101 101  CO2 24 25 24 24 25   GLUCOSE 111* 81 83 93 98  BUN 33* 29* 17 17 16   CREATININE 1.67* 1.40* 1.35* 1.21* 1.09*  CALCIUM 7.7* 7.3* 7.5* 7.5* 7.7*   Liver Function Tests: Recent Labs  Lab 08/10/20 1019 08/11/20 0559  AST 29 26  ALT 14 13  ALKPHOS 101 84  BILITOT 0.4 0.9  PROT 6.1* 5.2*  ALBUMIN 3.1* 2.6*   No results for input(s): LIPASE, AMYLASE in the last 168 hours. No results for input(s): AMMONIA in the last 168 hours. CBC: Recent Labs  Lab 08/10/20 1019 08/10/20 1711 08/11/20 0559 08/11/20 1617 08/12/20 0508 08/13/20 0540 08/14/20 0413 08/15/20 0650  WBC 11.6*  --  8.3   --  13.3* 9.1 11.3* 12.0*  NEUTROABS 8.9*  --   --   --   --   --   --   --   HGB 6.9*   < > 8.3* 8.4* 7.9* 10.2* 10.3* 11.2*  HCT 22.7*   < > 25.9*  --  24.5* 31.6* 33.0* 34.4*  MCV 91.9  --  89.3  --  89.7 88.5 90.9 90.1  PLT 508*  --  418*  --  382 377 410* 413*   < > = values in this interval not displayed.   Cardiac Enzymes: No results for input(s): CKTOTAL, CKMB, CKMBINDEX, TROPONINI in the last 168 hours. BNP: Invalid input(s): POCBNP CBG: No results for input(s): GLUCAP in the last 168 hours. D-Dimer No results for input(s): DDIMER in the last 72 hours. Hgb A1c No results for input(s): HGBA1C in the last 72 hours. Lipid Profile No results for input(s): CHOL, HDL, LDLCALC, TRIG, CHOLHDL, LDLDIRECT in the last 72 hours. Thyroid function studies No results for input(s): TSH, T4TOTAL, T3FREE, THYROIDAB in the last 72 hours.  Invalid input(s): FREET3 Anemia work up No results for input(s): VITAMINB12, FOLATE, FERRITIN, TIBC, IRON, RETICCTPCT in the last 72 hours. Urinalysis    Component Value Date/Time   COLORURINE STRAW (A) 07/15/2020 2351   APPEARANCEUR CLEAR (A) 07/15/2020 2351   LABSPEC 1.006 07/15/2020 2351  PHURINE 9.0 (H) 07/15/2020 2351   GLUCOSEU NEGATIVE 07/15/2020 2351   HGBUR NEGATIVE 07/15/2020 2351   BILIRUBINUR NEGATIVE 07/15/2020 2351   KETONESUR NEGATIVE 07/15/2020 2351   PROTEINUR NEGATIVE 07/15/2020 2351   NITRITE NEGATIVE 07/15/2020 2351   LEUKOCYTESUR NEGATIVE 07/15/2020 2351   Sepsis Labs Invalid input(s): PROCALCITONIN,  WBC,  LACTICIDVEN Microbiology Recent Results (from the past 240 hour(s))  Resp Panel by RT-PCR (Flu A&B, Covid) Nasopharyngeal Swab     Status: None   Collection Time: 08/10/20 11:24 AM   Specimen: Nasopharyngeal Swab; Nasopharyngeal(NP) swabs in vial transport medium  Result Value Ref Range Status   SARS Coronavirus 2 by RT PCR NEGATIVE NEGATIVE Final    Comment: (NOTE) SARS-CoV-2 target nucleic acids are NOT  DETECTED.  The SARS-CoV-2 RNA is generally detectable in upper respiratory specimens during the acute phase of infection. The lowest concentration of SARS-CoV-2 viral copies this assay can detect is 138 copies/mL. A negative result does not preclude SARS-Cov-2 infection and should not be used as the sole basis for treatment or other patient management decisions. A negative result may occur with  improper specimen collection/handling, submission of specimen other than nasopharyngeal swab, presence of viral mutation(s) within the areas targeted by this assay, and inadequate number of viral copies(<138 copies/mL). A negative result must be combined with clinical observations, patient history, and epidemiological information. The expected result is Negative.  Fact Sheet for Patients:  EntrepreneurPulse.com.au  Fact Sheet for Healthcare Providers:  IncredibleEmployment.be  This test is no t yet approved or cleared by the [place] FDA and  has been authorized for detection and/or diagnosis of SARS-CoV-2 by FDA under an Emergency Use Authorization (EUA). This EUA will remain  in effect (meaning this test can be used) for the duration of the COVID-19 declaration under Section 564(b)(1) of the Act, 21 U.S.C.section 360bbb-3(b)(1), unless the authorization is terminated  or revoked sooner.       Influenza A by PCR NEGATIVE NEGATIVE Final   Influenza B by PCR NEGATIVE NEGATIVE Final    Comment: (NOTE) The Xpert Xpress SARS-CoV-2/FLU/RSV plus assay is intended as an aid in the diagnosis of influenza from Nasopharyngeal swab specimens and should not be used as a sole basis for treatment. Nasal washings and aspirates are unacceptable for Xpert Xpress SARS-CoV-2/FLU/RSV testing.  Fact Sheet for Patients: EntrepreneurPulse.com.au  Fact Sheet for Healthcare Providers: IncredibleEmployment.be  This test is not yet  approved or cleared by the [place] FDA and has been authorized for detection and/or diagnosis of SARS-CoV-2 by FDA under an Emergency Use Authorization (EUA). This EUA will remain in effect (meaning this test can be used) for the duration of the COVID-19 declaration under Section 564(b)(1) of the Act, 21 U.S.C. section 360bbb-3(b)(1), unless the authorization is terminated or revoked.  Performed at [facility], [address], [place], [place] [address]   Urine culture     Status: Abnormal   Collection Time: 08/10/20  2:53 PM   Specimen: Urine, Random  Result Value Ref Range Status   Specimen Description   Final    URINE, RANDOM Performed at [facility], [address] [place], [place] [address]    Special Requests   Final    NONE Performed at [facility], [address] [place],  [address]    Culture MULTIPLE SPECIES PRESENT, SUGGEST RECOLLECTION (A)  Final   Report Status 08/12/2020 FINAL  Final     Patient was seen and examined on the day of  discharge and was found to be in stable condition. Time coordinating discharge: 40 minutes including assessment and coordination of care, as well as examination of the patient.   SIGNED:  Dessa Phi, DO [facility] 08/15/2020, 2:52 PM

## 2020-08-15 NOTE — Progress Notes (Addendum)
Daily Progress Note   Patient Name: Lynn Rosario       Date: 08/15/2020 DOB: [birth year]  Age: 87 y.o. MRN#: [id number] Attending Physician: Dessa Phi, DO Primary Care Physician: Sherrin Daisy, MD Admit Date: 08/10/2020  Reason for Consultation/Follow-up: Establishing goals of care  Subjective: Patient is resting in bed. Her daughter is at bedside. Patient is very clear she does not want any form of surgery and does not want oncologic treatment. She states "just keep me comfortable". Lynn Rosario states she is in agreement with this plan.  Patient and daughter would like hospice facility.   I completed a MOST form today (daughter Lynn Rosario is at bedside) and the signed original was placed in the chart. A photocopy was placed in the chart to be scanned into EMR. The patient outlined their wishes for the following treatment decisions:  Cardiopulmonary Resuscitation: Do Not Attempt Resuscitation (DNR/No CPR)  Medical Interventions: Comfort Measures: Keep clean, warm, and dry. Use medication by any route, positioning, wound care, and other measures to relieve pain and suffering. Use oxygen, suction and manual treatment of airway obstruction as needed for comfort. Do not transfer to the hospital unless comfort needs cannot be met in current location.  Antibiotics: No antibiotics (use other measures to relieve symptoms)  IV Fluids: No IV fluids (provide other measures to ensure comfort)  Feeding Tube: No feeding tube     Length of Stay: 5  Current Medications: Scheduled Meds:  . acetaminophen  650 mg Oral QPC lunch  . levothyroxine  75 mcg Oral Q0600  . multivitamin with minerals  1 tablet Oral Daily  . pantoprazole (PROTONIX) IV  40 mg Intravenous Q12H    Continuous Infusions: . promethazine  (PHENERGAN) injection Stopped (08/13/20 0010)    PRN Meds: albuterol, ALPRAZolam, alum & mag hydroxide-simeth, HYDROcodone-acetaminophen, ondansetron **OR** ondansetron (ZOFRAN) IV, promethazine (PHENERGAN) injection  Physical Exam Pulmonary:     Effort: Pulmonary effort is normal.  Neurological:     Mental Status: She is alert.             Vital Signs: BP (!) 144/75 (BP Location: Left Arm)   Pulse 85   Temp 97.7 F (36.5 C) (Oral)   Resp 18   Ht 5' (1.524 m)   Wt 47.1 kg   SpO2 97%   BMI 20.28 kg/m  SpO2: SpO2: 97 % O2 Device: O2 Device: Room Air O2 Flow Rate: O2 Flow Rate (L/min): 2 L/min  Intake/output summary:   Intake/Output Summary (Last 24 hours) at 08/15/2020 1130 Last data filed at 08/15/2020 1018 Gross per 24 hour  Intake 240 ml  Output 150 ml  Net 90 ml   LBM: Last BM Date: (P) 08/14/20 Baseline Weight: Weight: 47.2 kg Most recent weight: Weight: 47.1 kg        Patient Active Problem List   Diagnosis Date Noted  . Rectal mass   . Liver metastases ([place])   . Acute blood loss anemia   . Vaginal bleeding   . Acute kidney injury superimposed on CKD ([place])   . Essential hypertension   . Other chronic pain   . Hypothyroidism   . Lower GI bleed 08/10/2020  . Iron deficiency anemia 05/12/2020  . Anemia due to stage 3 chronic kidney disease ([place]) 08/24/2016  . Anemia in chronic kidney disease 02/24/2015    Palliative Care Assessment & Plan     Recommendations/Plan:  Recommend hospice facility placement.     Code Status:    Code Status Orders  (From admission, onward)         Start     Ordered   08/10/20 1556  Do not attempt resuscitation (DNR)  Continuous       Question Answer Comment  In the event of cardiac or respiratory ARREST Do not call a "code blue"   In the event of cardiac or respiratory ARREST Do not perform Intubation, CPR, defibrillation or ACLS   In the event of cardiac or respiratory ARREST Use medication by any route,  position, wound care, and other measures to relive pain and suffering. May use oxygen, suction and manual treatment of airway obstruction as needed for comfort.      08/10/20 1556        Code Status History    Date Active Date Inactive Code Status Order ID Comments User Context   08/10/2020 1453 08/10/2020 1556 Full Code [id number]  Clance Boll, MD ED   Advance Care Planning Activity    Advance Directive Documentation   Flowsheet Row Most Recent Value  Type of Advance Directive Healthcare Power of Attorney, Living will  Pre-existing out of facility DNR order (yellow form or pink MOST form) --  "MOST" Form in Place? --       Prognosis:   < 2 weeks hematuria, frank bloody stools, blood tranfusion this admission. Likely cancer, but circumferential mass in rectum- diarrhea.     Care plan was discussed with CM  Thank you for allowing the Palliative Medicine Team to assist in the care of this patient.   Time In: 9:40 Time Out: 10:20 Total Time 40 min Prolonged Time Billed no      Greater than 50%  of this time was spent counseling and coordinating care related to the above assessment and plan.  Asencion Gowda, NP  Please contact Palliative Medicine Team phone at [phone] for questions and concerns.

## 2020-08-18 ENCOUNTER — Ambulatory Visit: Payer: Medicare Other | Admitting: Hematology and Oncology

## 2020-08-18 ENCOUNTER — Ambulatory Visit: Payer: Medicare Other

## 2020-08-18 ENCOUNTER — Other Ambulatory Visit: Payer: Medicare Other

## 2020-08-20 ENCOUNTER — Ambulatory Visit: Payer: Medicare Other

## 2020-08-20 ENCOUNTER — Ambulatory Visit: Payer: Medicare Other | Admitting: Hematology and Oncology

## 2020-08-20 ENCOUNTER — Other Ambulatory Visit: Payer: Medicare Other

## 2020-09-05 ENCOUNTER — Ambulatory Visit: Payer: Self-pay | Admitting: Urology

## 2020-11-07 DEATH — deceased

## 2022-07-03 IMAGING — CR DG CHEST 2V
2 series · 2 of 2 positions shown · non-contrast
Comparison: 10/26/2017 (report only, no images)

CLINICAL DATA: Shortness of breath lower extremity edema in

EXAM:
CHEST - 2 VIEW

[chest lat]
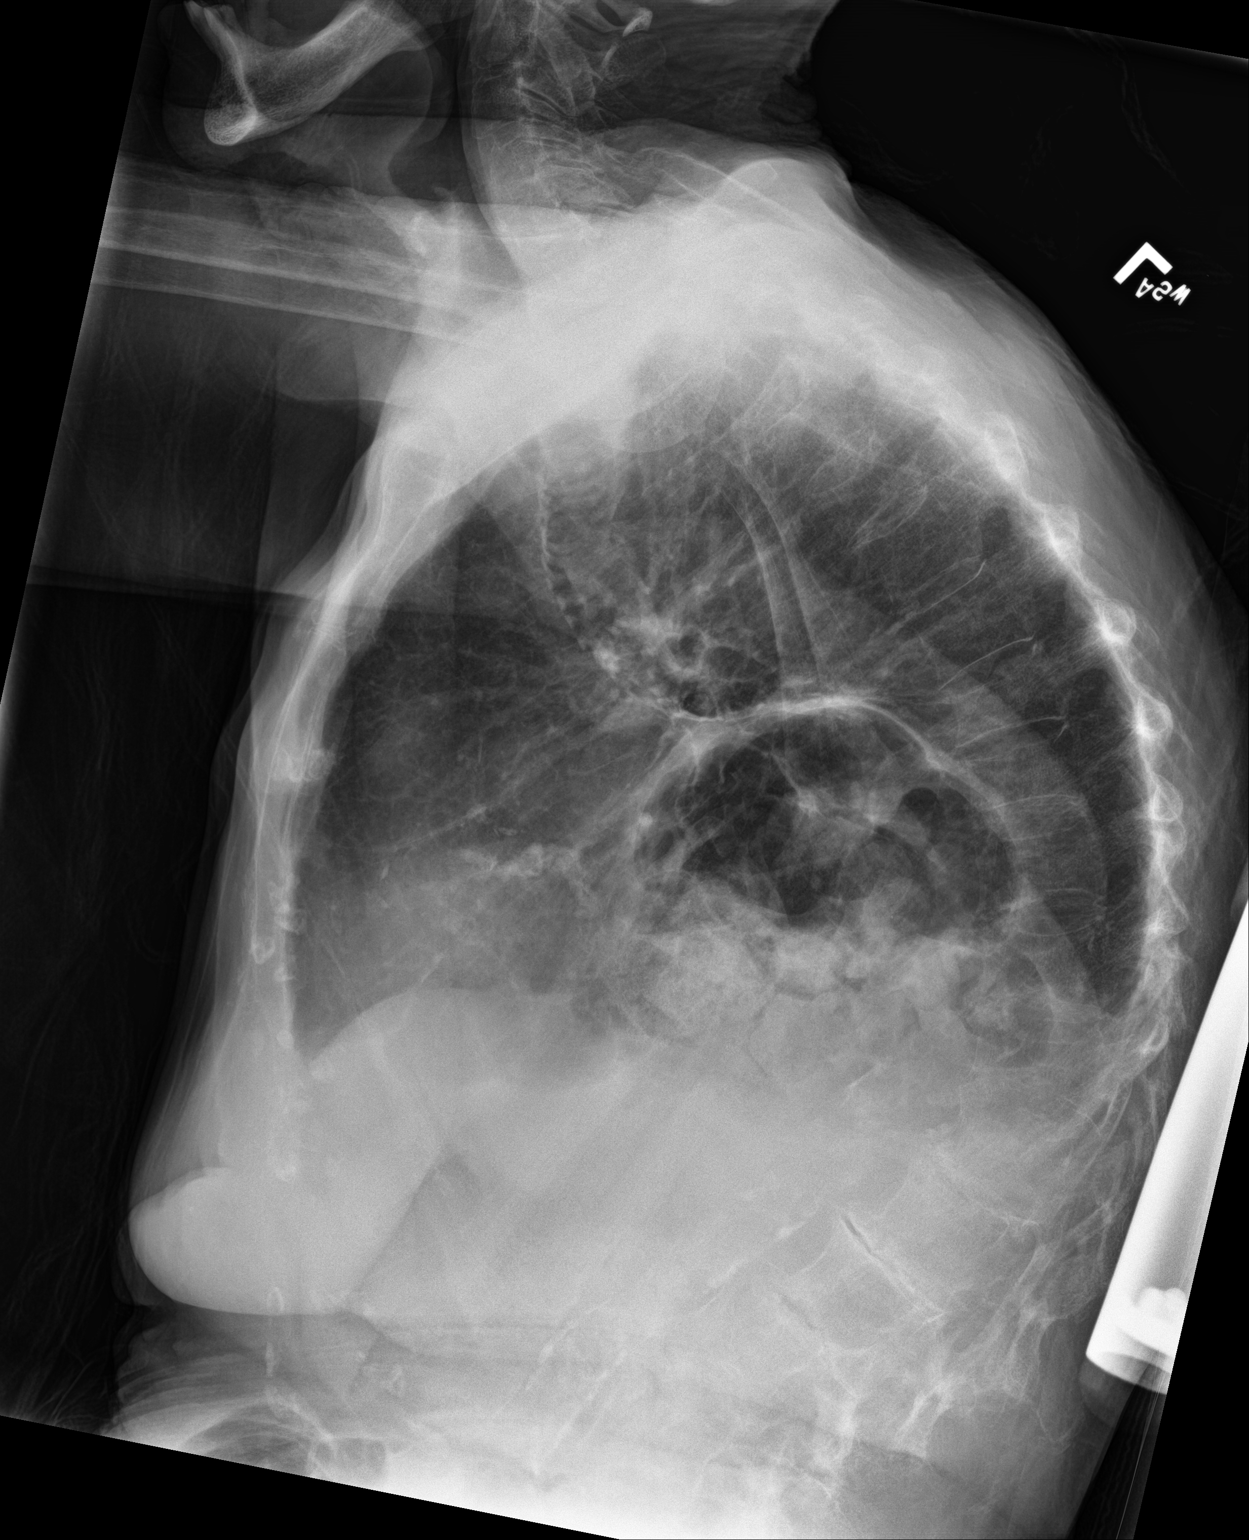

[chest ap]
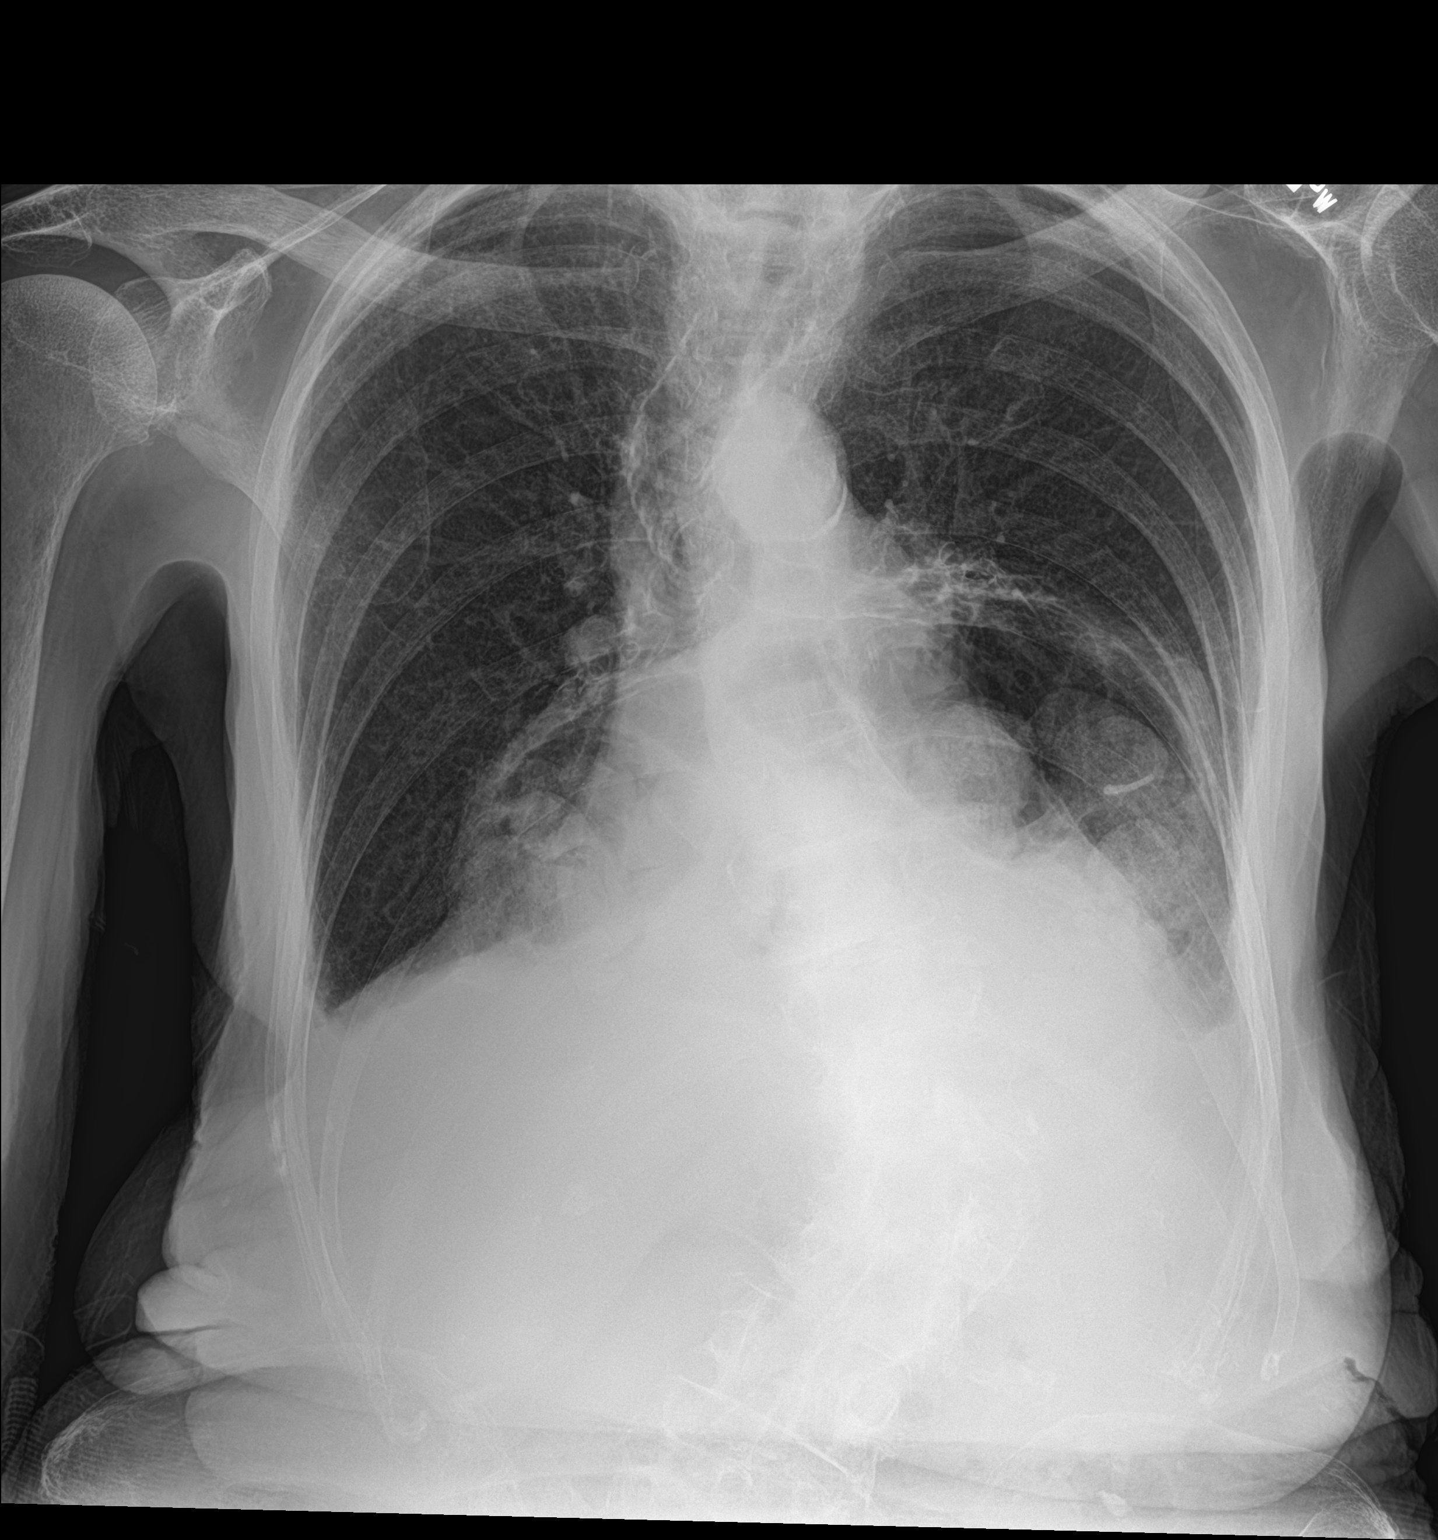

[2 of 2 positions shown; findings below may reference images not displayed]

FINDINGS: Increased AP diameter of the chest with hyperinflation and a
background of coarsened interstitial and bronchitic changes. Small
bilateral effusions are noted. Some additional patchy opacity
present in the left lung base, could reflect layering fluid,
atelectasis or edema. No pneumothorax. Large hiatal hernia is noted.
Cardiac silhouette is enlarged. Tortuous and calcified aorta.
Degenerative changes are present in the imaged spine and shoulders.
Exaggerated thoracic kyphosis with midthoracic vertebral body height
loss, difficult to fully visualize given osseous demineralization
and processes in the lung bases.
IMPRESSION: 1. Cardiomegaly and bilateral effusions, some additional opacity in
the left lung base, could reflect layering fluid, atelectasis or
edema. Correlate for clinical features of CHF.
2. Large hiatal hernia.
3. Exaggerated thoracic kyphosis with midthoracic compression
deformities, age indeterminate and incompletely assessed on this
exam.

## 2022-10-01 NOTE — Telephone Encounter (Signed)
Signing note, See previous encounter on 05/14/20
